# Patient Record
Sex: Female | Born: 1960
Health system: Southern US, Community
[De-identification: ages and names within clinical notes are randomized; demographics above are authoritative.]

## PROBLEM LIST (undated history)

## (undated) DIAGNOSIS — M797 Fibromyalgia: Secondary | ICD-10-CM

## (undated) DIAGNOSIS — E785 Hyperlipidemia, unspecified: Secondary | ICD-10-CM

## (undated) DIAGNOSIS — Z87442 Personal history of urinary calculi: Secondary | ICD-10-CM

## (undated) DIAGNOSIS — R7303 Prediabetes: Secondary | ICD-10-CM

## (undated) DIAGNOSIS — R519 Headache, unspecified: Secondary | ICD-10-CM

## (undated) DIAGNOSIS — G709 Myoneural disorder, unspecified: Secondary | ICD-10-CM

## (undated) DIAGNOSIS — K219 Gastro-esophageal reflux disease without esophagitis: Secondary | ICD-10-CM

## (undated) DIAGNOSIS — F419 Anxiety disorder, unspecified: Secondary | ICD-10-CM

## (undated) DIAGNOSIS — F32A Depression, unspecified: Secondary | ICD-10-CM

## (undated) DIAGNOSIS — IMO0002 Reserved for concepts with insufficient information to code with codable children: Secondary | ICD-10-CM

## (undated) DIAGNOSIS — R011 Cardiac murmur, unspecified: Secondary | ICD-10-CM

## (undated) DIAGNOSIS — Z9889 Other specified postprocedural states: Secondary | ICD-10-CM

## (undated) DIAGNOSIS — F329 Major depressive disorder, single episode, unspecified: Secondary | ICD-10-CM

## (undated) DIAGNOSIS — R112 Nausea with vomiting, unspecified: Secondary | ICD-10-CM

## (undated) DIAGNOSIS — D649 Anemia, unspecified: Secondary | ICD-10-CM

## (undated) DIAGNOSIS — T4145XA Adverse effect of unspecified anesthetic, initial encounter: Secondary | ICD-10-CM

## (undated) DIAGNOSIS — R51 Headache: Secondary | ICD-10-CM

## (undated) DIAGNOSIS — I10 Essential (primary) hypertension: Secondary | ICD-10-CM

## (undated) DIAGNOSIS — T8859XA Other complications of anesthesia, initial encounter: Secondary | ICD-10-CM

## (undated) HISTORY — DX: Hyperlipidemia, unspecified: E78.5

## (undated) HISTORY — DX: Essential (primary) hypertension: I10

## (undated) HISTORY — PX: TUBAL LIGATION: SHX77

## (undated) HISTORY — DX: Anxiety disorder, unspecified: F41.9

## (undated) HISTORY — PX: SHOULDER SURGERY: SHX246

## (undated) HISTORY — DX: Gastro-esophageal reflux disease without esophagitis: K21.9

## (undated) HISTORY — PX: BACK SURGERY: SHX140

## (undated) HISTORY — PX: CARPAL TUNNEL RELEASE: SHX101

## (undated) HISTORY — DX: Anemia, unspecified: D64.9

---

## 1998-12-15 ENCOUNTER — Ambulatory Visit (HOSPITAL_COMMUNITY): Admission: RE | Admit: 1998-12-15 | Discharge: 1998-12-15 | Payer: Self-pay | Admitting: Obstetrics and Gynecology

## 2001-05-04 ENCOUNTER — Encounter: Payer: Self-pay | Admitting: Family Medicine

## 2001-05-04 ENCOUNTER — Ambulatory Visit (HOSPITAL_COMMUNITY): Admission: RE | Admit: 2001-05-04 | Discharge: 2001-05-04 | Payer: Self-pay | Admitting: Family Medicine

## 2001-06-20 ENCOUNTER — Encounter (HOSPITAL_COMMUNITY): Admission: RE | Admit: 2001-06-20 | Discharge: 2001-07-04 | Payer: Self-pay | Admitting: Neurosurgery

## 2001-07-05 ENCOUNTER — Encounter (HOSPITAL_COMMUNITY): Admission: RE | Admit: 2001-07-05 | Discharge: 2001-08-04 | Payer: Self-pay | Admitting: Neurosurgery

## 2001-07-11 ENCOUNTER — Encounter: Admission: RE | Admit: 2001-07-11 | Discharge: 2001-07-11 | Payer: Self-pay | Admitting: Neurosurgery

## 2001-07-11 ENCOUNTER — Encounter: Payer: Self-pay | Admitting: Neurosurgery

## 2001-07-25 ENCOUNTER — Encounter: Payer: Self-pay | Admitting: Neurosurgery

## 2001-07-25 ENCOUNTER — Encounter: Admission: RE | Admit: 2001-07-25 | Discharge: 2001-07-25 | Payer: Self-pay | Admitting: Neurosurgery

## 2001-07-31 ENCOUNTER — Encounter: Payer: Self-pay | Admitting: Neurosurgery

## 2001-07-31 ENCOUNTER — Encounter: Admission: RE | Admit: 2001-07-31 | Discharge: 2001-07-31 | Payer: Self-pay | Admitting: Neurosurgery

## 2001-08-21 ENCOUNTER — Encounter: Payer: Self-pay | Admitting: Neurosurgery

## 2001-08-23 ENCOUNTER — Encounter: Payer: Self-pay | Admitting: Neurosurgery

## 2001-08-23 ENCOUNTER — Inpatient Hospital Stay (HOSPITAL_COMMUNITY): Admission: RE | Admit: 2001-08-23 | Discharge: 2001-08-24 | Payer: Self-pay | Admitting: Neurosurgery

## 2003-03-20 ENCOUNTER — Ambulatory Visit (HOSPITAL_COMMUNITY): Admission: RE | Admit: 2003-03-20 | Discharge: 2003-03-20 | Payer: Self-pay | Admitting: Internal Medicine

## 2003-03-20 ENCOUNTER — Encounter: Payer: Self-pay | Admitting: Internal Medicine

## 2004-09-06 ENCOUNTER — Ambulatory Visit (HOSPITAL_COMMUNITY): Admission: RE | Admit: 2004-09-06 | Discharge: 2004-09-06 | Payer: Self-pay | Admitting: Family Medicine

## 2005-03-04 ENCOUNTER — Ambulatory Visit (HOSPITAL_COMMUNITY): Admission: RE | Admit: 2005-03-04 | Discharge: 2005-03-04 | Payer: Self-pay | Admitting: Family Medicine

## 2005-07-12 ENCOUNTER — Other Ambulatory Visit: Admission: RE | Admit: 2005-07-12 | Discharge: 2005-07-12 | Payer: Self-pay | Admitting: Obstetrics and Gynecology

## 2005-11-16 ENCOUNTER — Encounter
Admission: RE | Admit: 2005-11-16 | Discharge: 2006-02-14 | Payer: Self-pay | Admitting: Physical Medicine & Rehabilitation

## 2005-11-16 ENCOUNTER — Ambulatory Visit: Payer: Self-pay | Admitting: Physical Medicine & Rehabilitation

## 2005-12-29 ENCOUNTER — Ambulatory Visit: Payer: Self-pay | Admitting: Physical Medicine & Rehabilitation

## 2006-02-23 ENCOUNTER — Ambulatory Visit: Payer: Self-pay | Admitting: Physical Medicine & Rehabilitation

## 2006-02-23 ENCOUNTER — Encounter
Admission: RE | Admit: 2006-02-23 | Discharge: 2006-05-24 | Payer: Self-pay | Admitting: Physical Medicine & Rehabilitation

## 2006-04-18 ENCOUNTER — Ambulatory Visit: Payer: Self-pay | Admitting: Physical Medicine & Rehabilitation

## 2006-07-19 ENCOUNTER — Encounter
Admission: RE | Admit: 2006-07-19 | Discharge: 2006-10-17 | Payer: Self-pay | Admitting: Physical Medicine & Rehabilitation

## 2006-07-19 ENCOUNTER — Ambulatory Visit: Payer: Self-pay | Admitting: Physical Medicine & Rehabilitation

## 2006-08-15 ENCOUNTER — Ambulatory Visit (HOSPITAL_COMMUNITY): Admission: RE | Admit: 2006-08-15 | Discharge: 2006-08-15 | Payer: Self-pay | Admitting: Family Medicine

## 2006-10-18 ENCOUNTER — Ambulatory Visit: Payer: Self-pay | Admitting: Physical Medicine & Rehabilitation

## 2006-10-18 ENCOUNTER — Encounter
Admission: RE | Admit: 2006-10-18 | Discharge: 2007-01-16 | Payer: Self-pay | Admitting: Physical Medicine & Rehabilitation

## 2007-01-10 ENCOUNTER — Encounter
Admission: RE | Admit: 2007-01-10 | Discharge: 2007-04-10 | Payer: Self-pay | Admitting: Physical Medicine & Rehabilitation

## 2007-01-10 ENCOUNTER — Ambulatory Visit: Payer: Self-pay | Admitting: Physical Medicine & Rehabilitation

## 2007-04-03 ENCOUNTER — Ambulatory Visit: Payer: Self-pay | Admitting: Physical Medicine & Rehabilitation

## 2007-06-27 ENCOUNTER — Ambulatory Visit: Payer: Self-pay | Admitting: Physical Medicine & Rehabilitation

## 2007-06-27 ENCOUNTER — Encounter
Admission: RE | Admit: 2007-06-27 | Discharge: 2007-09-04 | Payer: Self-pay | Admitting: Physical Medicine & Rehabilitation

## 2007-09-18 ENCOUNTER — Encounter
Admission: RE | Admit: 2007-09-18 | Discharge: 2007-11-13 | Payer: Self-pay | Admitting: Physical Medicine & Rehabilitation

## 2007-09-18 ENCOUNTER — Ambulatory Visit: Payer: Self-pay | Admitting: Physical Medicine & Rehabilitation

## 2007-12-12 ENCOUNTER — Encounter
Admission: RE | Admit: 2007-12-12 | Discharge: 2008-02-18 | Payer: Self-pay | Admitting: Physical Medicine & Rehabilitation

## 2007-12-12 ENCOUNTER — Ambulatory Visit: Payer: Self-pay | Admitting: Physical Medicine & Rehabilitation

## 2007-12-28 ENCOUNTER — Ambulatory Visit (HOSPITAL_COMMUNITY): Admission: RE | Admit: 2007-12-28 | Discharge: 2007-12-28 | Payer: Self-pay | Admitting: Obstetrics and Gynecology

## 2008-01-16 ENCOUNTER — Other Ambulatory Visit: Admission: RE | Admit: 2008-01-16 | Discharge: 2008-01-16 | Payer: Self-pay | Admitting: Obstetrics and Gynecology

## 2008-03-04 ENCOUNTER — Encounter
Admission: RE | Admit: 2008-03-04 | Discharge: 2008-03-06 | Payer: Self-pay | Admitting: Physical Medicine & Rehabilitation

## 2008-03-06 ENCOUNTER — Ambulatory Visit: Payer: Self-pay | Admitting: Physical Medicine & Rehabilitation

## 2008-07-08 ENCOUNTER — Encounter
Admission: RE | Admit: 2008-07-08 | Discharge: 2008-07-09 | Payer: Self-pay | Admitting: Physical Medicine & Rehabilitation

## 2008-07-09 ENCOUNTER — Ambulatory Visit: Payer: Self-pay | Admitting: Physical Medicine & Rehabilitation

## 2008-10-28 ENCOUNTER — Encounter
Admission: RE | Admit: 2008-10-28 | Discharge: 2008-10-29 | Payer: Self-pay | Admitting: Physical Medicine & Rehabilitation

## 2008-10-29 ENCOUNTER — Ambulatory Visit: Payer: Self-pay | Admitting: Physical Medicine & Rehabilitation

## 2008-11-11 ENCOUNTER — Ambulatory Visit (HOSPITAL_COMMUNITY)
Admission: RE | Admit: 2008-11-11 | Discharge: 2008-11-11 | Payer: Self-pay | Admitting: Physical Medicine & Rehabilitation

## 2009-01-06 ENCOUNTER — Ambulatory Visit (HOSPITAL_BASED_OUTPATIENT_CLINIC_OR_DEPARTMENT_OTHER): Admission: RE | Admit: 2009-01-06 | Discharge: 2009-01-07 | Payer: Self-pay | Admitting: Orthopedic Surgery

## 2009-01-20 ENCOUNTER — Encounter
Admission: RE | Admit: 2009-01-20 | Discharge: 2009-04-20 | Payer: Self-pay | Admitting: Physical Medicine & Rehabilitation

## 2009-01-21 ENCOUNTER — Ambulatory Visit: Payer: Self-pay | Admitting: Physical Medicine & Rehabilitation

## 2009-02-18 ENCOUNTER — Ambulatory Visit: Payer: Self-pay | Admitting: Physical Medicine & Rehabilitation

## 2009-03-25 ENCOUNTER — Ambulatory Visit: Payer: Self-pay | Admitting: Physical Medicine & Rehabilitation

## 2009-04-27 ENCOUNTER — Encounter
Admission: RE | Admit: 2009-04-27 | Discharge: 2009-07-26 | Payer: Self-pay | Admitting: Physical Medicine & Rehabilitation

## 2009-04-29 ENCOUNTER — Ambulatory Visit: Payer: Self-pay | Admitting: Physical Medicine & Rehabilitation

## 2009-06-24 ENCOUNTER — Ambulatory Visit: Payer: Self-pay | Admitting: Physical Medicine & Rehabilitation

## 2009-08-17 ENCOUNTER — Encounter
Admission: RE | Admit: 2009-08-17 | Discharge: 2009-08-19 | Payer: Self-pay | Admitting: Physical Medicine & Rehabilitation

## 2009-08-19 ENCOUNTER — Ambulatory Visit: Payer: Self-pay | Admitting: Physical Medicine & Rehabilitation

## 2009-11-10 ENCOUNTER — Encounter
Admission: RE | Admit: 2009-11-10 | Discharge: 2009-12-01 | Payer: Self-pay | Admitting: Physical Medicine & Rehabilitation

## 2009-11-11 ENCOUNTER — Ambulatory Visit: Payer: Self-pay | Admitting: Physical Medicine & Rehabilitation

## 2010-02-01 ENCOUNTER — Encounter
Admission: RE | Admit: 2010-02-01 | Discharge: 2010-02-03 | Payer: Self-pay | Admitting: Physical Medicine & Rehabilitation

## 2010-02-03 ENCOUNTER — Ambulatory Visit: Payer: Self-pay | Admitting: Physical Medicine & Rehabilitation

## 2010-04-27 ENCOUNTER — Encounter
Admission: RE | Admit: 2010-04-27 | Discharge: 2010-04-28 | Payer: Self-pay | Admitting: Physical Medicine & Rehabilitation

## 2010-04-28 ENCOUNTER — Ambulatory Visit: Payer: Self-pay | Admitting: Physical Medicine & Rehabilitation

## 2010-07-02 ENCOUNTER — Ambulatory Visit (HOSPITAL_COMMUNITY): Admission: RE | Admit: 2010-07-02 | Discharge: 2010-07-02 | Payer: Self-pay | Admitting: Obstetrics and Gynecology

## 2010-07-16 ENCOUNTER — Encounter
Admission: RE | Admit: 2010-07-16 | Discharge: 2010-10-12 | Payer: Self-pay | Admitting: Physical Medicine & Rehabilitation

## 2010-07-26 ENCOUNTER — Ambulatory Visit: Payer: Self-pay | Admitting: Physical Medicine & Rehabilitation

## 2010-10-12 ENCOUNTER — Encounter
Admission: RE | Admit: 2010-10-12 | Discharge: 2010-11-19 | Payer: Self-pay | Source: Home / Self Care | Attending: Physical Medicine & Rehabilitation | Admitting: Physical Medicine & Rehabilitation

## 2010-10-18 ENCOUNTER — Ambulatory Visit: Payer: Self-pay | Admitting: Physical Medicine & Rehabilitation

## 2010-11-19 ENCOUNTER — Ambulatory Visit: Payer: Self-pay | Admitting: Physical Medicine & Rehabilitation

## 2011-02-14 ENCOUNTER — Encounter: Payer: BC Managed Care – PPO | Attending: Physical Medicine & Rehabilitation

## 2011-02-14 ENCOUNTER — Ambulatory Visit (HOSPITAL_BASED_OUTPATIENT_CLINIC_OR_DEPARTMENT_OTHER): Payer: BC Managed Care – PPO | Admitting: Physical Medicine & Rehabilitation

## 2011-02-14 DIAGNOSIS — M719 Bursopathy, unspecified: Secondary | ICD-10-CM | POA: Insufficient documentation

## 2011-02-14 DIAGNOSIS — G8929 Other chronic pain: Secondary | ICD-10-CM | POA: Insufficient documentation

## 2011-02-14 DIAGNOSIS — M5382 Other specified dorsopathies, cervical region: Secondary | ICD-10-CM

## 2011-02-14 DIAGNOSIS — IMO0002 Reserved for concepts with insufficient information to code with codable children: Secondary | ICD-10-CM | POA: Insufficient documentation

## 2011-02-14 DIAGNOSIS — M545 Low back pain, unspecified: Secondary | ICD-10-CM

## 2011-02-14 DIAGNOSIS — M2170 Unequal limb length (acquired), unspecified site: Secondary | ICD-10-CM | POA: Insufficient documentation

## 2011-02-14 DIAGNOSIS — M7530 Calcific tendinitis of unspecified shoulder: Secondary | ICD-10-CM

## 2011-02-14 DIAGNOSIS — IMO0001 Reserved for inherently not codable concepts without codable children: Secondary | ICD-10-CM | POA: Insufficient documentation

## 2011-02-14 DIAGNOSIS — M67919 Unspecified disorder of synovium and tendon, unspecified shoulder: Secondary | ICD-10-CM | POA: Insufficient documentation

## 2011-03-02 ENCOUNTER — Ambulatory Visit (HOSPITAL_COMMUNITY)
Admission: RE | Admit: 2011-03-02 | Discharge: 2011-03-02 | Disposition: A | Discharge status: Home / Self Care | Payer: BC Managed Care – PPO | Source: Ambulatory Visit | Attending: Family Medicine | Admitting: Family Medicine

## 2011-03-02 ENCOUNTER — Other Ambulatory Visit (HOSPITAL_COMMUNITY): Payer: Self-pay | Admitting: Family Medicine

## 2011-03-02 DIAGNOSIS — S99929A Unspecified injury of unspecified foot, initial encounter: Secondary | ICD-10-CM | POA: Insufficient documentation

## 2011-03-02 DIAGNOSIS — S99919A Unspecified injury of unspecified ankle, initial encounter: Secondary | ICD-10-CM | POA: Insufficient documentation

## 2011-03-02 DIAGNOSIS — W19XXXA Unspecified fall, initial encounter: Secondary | ICD-10-CM | POA: Insufficient documentation

## 2011-03-02 DIAGNOSIS — M79609 Pain in unspecified limb: Secondary | ICD-10-CM | POA: Insufficient documentation

## 2011-03-02 DIAGNOSIS — M79669 Pain in unspecified lower leg: Secondary | ICD-10-CM

## 2011-03-02 DIAGNOSIS — M25572 Pain in left ankle and joints of left foot: Secondary | ICD-10-CM

## 2011-03-02 DIAGNOSIS — M25579 Pain in unspecified ankle and joints of unspecified foot: Secondary | ICD-10-CM | POA: Insufficient documentation

## 2011-03-02 DIAGNOSIS — S8990XA Unspecified injury of unspecified lower leg, initial encounter: Secondary | ICD-10-CM | POA: Insufficient documentation

## 2011-03-22 LAB — BASIC METABOLIC PANEL
BUN: 7 mg/dL (ref 6–23)
CO2: 27 mEq/L (ref 19–32)
Calcium: 9.1 mg/dL (ref 8.4–10.5)
Chloride: 105 mEq/L (ref 96–112)
Creatinine, Ser: 0.58 mg/dL (ref 0.4–1.2)
GFR calc Af Amer: >60 mL/min (ref >60)

## 2011-03-22 LAB — POCT HEMOGLOBIN-HEMACUE: Hemoglobin: 14.9 g/dL (ref 12.0–15.0)

## 2011-04-04 NOTE — Assessment & Plan Note (Signed)
Melanie Avila is back regarding Melanie Avila fibromyalgia, back and leg pain.  The Lyrica may have helped a bit with pain.  She had some dizziness with it initially, but feels that she has become accustomed to that now and tolerating it better.  She still has a lot of work stressed upon Melanie Avila at home, taking care of Melanie Avila mother and working essentially full-time.  Melanie Avila pain is 6-8/10 currently.  Pain is tingling, aching, stabbing, mostly in the back and left posterior leg.  Pain interferes with general activities, relations with others, enjoyment of life on a moderate level.  Sleep is fair.  REVIEW OF SYSTEMS:  Notable for the above as well as spasms, depression, anxiety, tingling, weakness.  Full 12-point review is in the written health and history section of the chart.  PHYSICAL EXAMINATION:  VITAL SIGNS:  Blood pressure is stable at 140/90, pulse 72, respiratory rate 18.  She is satting 97% on room air. GENERAL:  The patient is pleasant, alert and oriented x3.  Affect is generally bright and appropriate. MUSCULOSKELETAL:  Pain is still with flexion, more than extension. Decreased sensation on the posterior leg on the left.  Strength generally at the ankle is 3/5 with plantar flexion.  Reflexes are absent at the left Achilles tendon.  She has pain with palpation over the lower lumbar spine segments, particularly at L5-S1.  She had difficulty moving from a sitting to standing position and tends to walk with a bit of a flexed position.  She has pain elsewhere in the arms, legs, shoulders, upper back, etc.  ASSESSMENT: 1. Fibromyalgia syndrome. 2. Chronic low back pain with chronic left S1 radiculopathy. 3. Left leg length discrepancy. 4. Right rotator cuff syndrome. 5. Questionable temporomandibular joint pain.  PLAN: 1. Continue with Lyrica, increase ultimately to 100 t.i.d. 2. Discussed using a long-acting opiates as I wonder hydrocodone use     to diminish.  If she has no significant results with  Lyrica     titration, we will begin fentanyl patch next month 25 mcg q.72     hours. 3. We discussed again the fact that until Melanie Avila family situation     changes.  Melanie Avila problems are not apt to improve as frankly she is     exhausted all the time. 4. I will see Melanie Avila back in about 2-3 months.  She will call me with     questions about the fentanyl and certainly will see me back sooner     if she chooses to start this medication.     Ranelle Oyster, M.D. Electronically Signed    ZTS/MedQ D:  02/14/2011 13:45:33  T:  02/14/2011 22:23:03  Job #:  [id number]

## 2011-04-15 ENCOUNTER — Other Ambulatory Visit (HOSPITAL_COMMUNITY): Payer: Self-pay | Admitting: Family Medicine

## 2011-04-15 DIAGNOSIS — R109 Unspecified abdominal pain: Secondary | ICD-10-CM

## 2011-04-18 ENCOUNTER — Encounter (HOSPITAL_COMMUNITY)
Admission: RE | Admit: 2011-04-18 | Discharge: 2011-04-18 | Disposition: A | Discharge status: Home / Self Care | Payer: BC Managed Care – PPO | Source: Ambulatory Visit | Attending: Family Medicine | Admitting: Family Medicine

## 2011-04-18 DIAGNOSIS — R112 Nausea with vomiting, unspecified: Secondary | ICD-10-CM | POA: Insufficient documentation

## 2011-04-18 DIAGNOSIS — R109 Unspecified abdominal pain: Secondary | ICD-10-CM | POA: Insufficient documentation

## 2011-04-18 MED ORDER — TECHNETIUM TC 99M MEBROFENIN IV KIT
5.0000 | PACK | Freq: Once | INTRAVENOUS | Status: AC | PRN
  Administered 2011-04-18: 5.28 via INTRAVENOUS

## 2011-04-19 NOTE — Assessment & Plan Note (Signed)
Melanie Avila returns to clinic today for followup evaluation.  She reports  that she is doing well overall.  She continues to take minimal amounts  of her pain medicines.  She does need a refill on the hydrocodone along  with the Xanax in the office today.  She has a sufficient supply of Soma  at this point.  Her job continues to go well.  She has made no other  changes in her medicines.   REVIEW OF SYSTEMS:  Positive for sleep apnea.   MEDICATIONS:  1. Hydrocodone 7.5/650 one tablet t.i.d. p.r.n. (three per day).  2. Iron sulfate 225 mg daily.  3. Benicar/hydrochlorothiazide 40/12.5, one-half tablet daily.  4. Diclofenac 75 mg b.i.d.  5. Zetia 10 mg daily.  6. Fish Oil 1000 mg daily.  7. Alprazolam 1 mg, one-half to one tablet p.o. q.h.s.  8. Soma 350 mg t.i.d. p.r.n. (1-3 per day).   PHYSICAL EXAM:  A well-appearing thin adult female in mild to no acute  discomfort, blood pressure 159/85 with a pulse of 81, respiratory rate  18 and O2 saturation 100% on room air.  She is 5-/5 strength throughout  . She ambulates without any assistive device.   IMPRESSION:  1. Diffuse musculoskeletal pain, questionable fibromyalgia.  2. Acute shoulder pain after work, resolved.  3. Acute low back pain exacerbation, improved.   In the office today we did refill the patient's Xanax and hydrocodone.  Will plan on seeing her in followup in approximately 3 months time with  refills prior to that appointment as necessary.           ______________________________  Ellwood Dense, M.D.     DC/MedQ  D:  06/28/2007 10:16:58  T:  06/28/2007 16:26:14  Job #:  [id number]

## 2011-04-19 NOTE — Assessment & Plan Note (Signed)
Ms. Morash returns to clinic today for followup evaluation.  She reports  that overall she is doing well.  She continues to work at a regular job  which includes 12-hour shifts.  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  Her present job certainly requires more lifting than she has  been doing in the past.  She also continues to care for her elderly  mother who may or may not have lung cancer.  She is doing a lot of  transporting in terms of getting her mother to doctor's appointments.   REVIEW OF SYSTEMS:  Noncontributory.   MEDICATIONS:  1. Hydrocodone 7.5/650 one tablet 3 to 5 times per day p.r.n.  2. Iron sulfate 225 mg daily.  3. Benicar/hydrochlorothiazide 40/12.5 one-half tablet daily.  4. Diclofenac 75 mg b.i.d.  5. Zetia 10 mg daily.  6. Fish oil 1000 mg daily.  7. Alprazolam 1 mg p.o. nightly.  8. Soma 350 mg t.i.d. p.r.n. (0 to 3 per day).   PHYSICAL EXAMINATION:  GENERAL:  Well-appearing fit adult female in mild  acute discomfort.  VITAL SIGNS:  Blood pressure 148/98 with pulse of 84, respiratory rate  18, and O2 saturation 99% on room air.  She has 4+/5 strength throughout  the bilateral upper and lower extremities.  She ambulates without any  assistive device.   IMPRESSION:  1. Diffuse musculoskeletal pain, questionable fibromyalgia.  2. Acute right shoulder pain, improved.  3. Acute low back pain exacerbation in mid December 2008.   In the office today, no refill on medication is necessary.  She  continues to use the hydrocodone, gets good analgesic effect without  signs of diversion or significant side effects.  We will plan on seeing  the patient in followup in this office in approximately 4 months' time  with refills prior to that appointment if necessary.           ______________________________  Ellwood Dense, M.D.     DC/MedQ  D:  07/09/2008 09:18:07  T:  07/10/2008 00:04:05   Job #:  [id number]

## 2011-04-19 NOTE — Assessment & Plan Note (Signed)
Melanie Avila returns to clinic today for followup evaluation.  She reports  that she had acute onset of increased right neck and arm pain dating  from November 22, 2007.  She reports no precipitating event at that time  and reports that she was on leave for the holidays from work started  November 26, 2007 through December 08, 2007.  She has returned to work but  still having persistent pain.  She does report that she gets a little  bit of relief with use of the Soma and hydrocodone at slightly increased  dosages.  She reports that her pain is present in her right neck along  with the right shoulder and entire right upper extremity above the elbow  level along with low back.  She, again, notes no precipitating event,  back in mid-December, that would have brought on this pain.   REVIEW OF SYSTEMS:  Noncontributory.   MEDICATIONS:  1. Hydrocodone 7.5/650 1 tablet 3-4 times per day.  2. Iron sulfate 225 mg  3. Benicar/hydrochlorothiazide 40/12.5 1/2 tablet daily.  4. Diclofenac 75 mg b.i.d.  5. Zetia 10 mg daily.  6. Fish oil 1000 mg daily.  7. Alprazolam 1 mg 1 tablet p.o. nightly.  8. Soma 350 mg t.i.d. p.r.n. (approximately 3 per day lately).   PHYSICAL EXAMINATION:  GENERAL:  A middle-aged adult female in moderate  to acute discomfort involving her right shoulder and right neck and low  back.  She ambulates without any assistive device.  She does not  tolerate range of motion or manual muscle testing of the right upper  extremity.   IMPRESSION:  1. Diffuse musculoskeletal pain, questionable fibromyalgia.  2. Acute worsening right shoulder and arm pain without precipitating      event, mid-December 2008.  3. Acute low back pain exacerbation mid-December 2008.   In the office today, we did increase the patient's hydrocodone to 10/325  and allow her to use up to 5 tablets per day.  We refilled her  Diclofenac and her Soma.  She is allowed to use the Soma up to 3 times  per day.  The  Alprazolam is increased to a whole mg tablet at bedtime.  Will plan on seeing her in followup in approximately 3 months' time.  Anticipate that her pain will gradually resolve over the next few weeks,  as it came on acutely with out any etiology.  It appears a fibromyalgia-  type pain and should resolve with continued pain medicines and time.  We  will plan on seeing the patient in followup as noted above.           ______________________________  Ellwood Dense, M.D.     DC/MedQ  D:  12/13/2007 11:00:37  T:  12/13/2007 11:28:52  Job #:  [id number]

## 2011-04-19 NOTE — Assessment & Plan Note (Signed)
Ms. Niehoff returns to clinic today for follow up evaluation.  Overall she  reports that she is doing fairly well.  She continues to take her  hydrocodone, approximately 3 tablets per day, with reasonable relief.  She does use minimal amounts of the Soma and alprazolam, and needs  refills on each of those in the office today.  She continues to take her  diclofenac twice a day, but has a sufficient supply at the present time.   REVIEW OF SYSTEMS:  Positive for low blood sugar.   MEDICATIONS:  1. Hydrocodone 7.5/650 one tablet t.i.d. p.r.n. (3 per day).  2. Iron sulfate 225 mg daily.  3. Benicar/hydrochlorothiazide 40/12.5 one-half tablet daily.  4. Diclofenac 75 mg b.i.d.  5. Zetia 10 mg.  6. Fish oil 1000 mg daily.  7. Alprazolam 1 mg one-half tablet to one tablet p.o. nightly.  8. Soma 350 mg t.i.d. p.r.n. (1/2 to 1 daily).   PHYSICAL EXAMINATION:  GENERAL:  A well-appearing, middle-aged, adult  female in mild to no acute discomfort.  VITAL SIGNS:  Blood pressure 152/70 with a pulse of 90, respiratory rate  18 an O2 saturation 98% on room air.  EXTREMITIES:  She ambulates without any assistive device.  She has 5-/5  strength throughout.   IMPRESSION:  1. Diffuse musculoskeletal pain, questionable fibromyalgia.  2. Acute shoulder pain after work, resolving.  3. Acute low back pain exacerbation, improved.   In the office today, we did refill the patient's alprazolam and Soma as  of today.  We refilled her hydrocodone as of October 06, 2007.  We will  plan in seeing her and follow up in approximately 3 months time with  refills prior to that appointment as necessary.           ______________________________  Ellwood Dense, M.D.     DC/MedQ  D:  09/19/2007 11:25:16  T:  09/20/2007 09:26:19  Job #:  [id number]

## 2011-04-19 NOTE — Op Note (Signed)
Melanie Avila, Melanie Avila                 ACCOUNT NO.:  [id number]   MEDICAL RECORD NO.:  [id number]          PATIENT TYPE:  AMB   LOCATION:  [facility]                          FACILITY:  [facility]   PHYSICIAN:  Katy Fitch. Sypher, M.D. DATE OF BIRTH:  [birth year]   DATE OF PROCEDURE:  01/06/2009  DATE OF DISCHARGE:                               OPERATIVE REPORT   PREOPERATIVE DIAGNOSES:  1. Severe partial articular surface tear of supraspinatus and      infraspinatus tendons, right shoulder.  2. Severe acromioclavicular arthropathy, right shoulder.  3. MRI evident tendinopathy of subscapularis and labral degenerative      changes and an intramuscular ganglion cyst in the infraspinatus      tendon.   POSTOPERATIVE DIAGNOSES:  A 95-99% two-tendon partial articular surface  rotator cuff tear with avulsion and degenerative failure of rotator cuff  insertion at greater tuberosity and a grade 1 subscapularis degenerative  tear with severe acromioclavicular arthropathy and unfavorable acromial  anatomy as well as moderate degree of labral pathology due to  degenerative change.   OPERATION:  1. Examination of right shoulder under anesthesia.  2. Arthroscopic debridement of labrum, rotator cuff including      supraspinatus, infraspinatus, and subscapularis, and synovectomy.  3. Arthroscopic subacromial decompression with bursectomy,      coracoacromial ligament relaxation, and acromioplasty.  4. Arthroscopic resection of distal 15-mm clavicle.  5. Arthroscopic reconstruction of two-tendon rotator cuff tear with      debridement of necrotic tendon, decortication and tuberosity plasty      of greater tuberosity, and a speed bridge repair utilizing two 5-mm      FiberWire tapes, 4 swivel locks, and a Burkhart bridge suture to      reconstruct the tendon to the articular margin.   OPERATING SURGEON:  Katy Fitch. Sypher, MD   ASSISTANT:  Marveen Reeks Dasnoit, PA-C   ANESTHESIA:  General endotracheal  supplemented by a right interscalene  block.   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   She is referred through the courtesy of Dr. Ellwood Dense, physiatrist  at the [facility] for evaluation of a chronically painful right  shoulder.   Dr. Thomasena Edis evaluated shoulder and identified a severe tendinopathy of  the supraspinatus and infraspinatus tendon with a articular surface  tearing and a large intramuscular/musculotendinous junction ganglion  cyst in the infraspinatus.   Dr. Thomasena Edis referred Ms. Labella for an upper extremity orthopedic consult  on November 26, 2008.  At that time, she was noted have marked weakness  of abduction, external rotation, signs of rotator cuff tear, and severe  AC arthropathy.  The MRI was reviewed on desk and notable for  significant compromise of the subacromial outlet due to medial  osteophyte formation at the acromion and the distal clavicle being  prominent inferiorly.  There was marked swelling in the rotator cuff due  to chronic impingement and a partial articular surface rotator cuff  tear.   We advised her to proceed with arthroscopic  evaluation, probable cuff  repair, and debridement as necessary.  After informed consent, she is  brought to the operating room at this time.  Preoperatively, she was  reminded that it would take a minimum of 12-16 weeks to recover  reasonable function to return to work.  She understands that the surgery  may require either arthroscopic or open technique, depending on our  ability to precisely repair her tendon.  She was reminded of the risks  of infection, anesthesia, and potential failure of the repair.  After  informed consent, she is brought to the operating room at this time.   PROCEDURE:  Josphine Laffey is brought to the operating room and placed in  supine position upon the operating table.   Following an  anesthesia consult with Dr. Jean Rosenthal, general anesthesia by  endotracheal technique supplemented by interscalene block was  recommended and accepted by Ms. Katherina Right   Dr. Jean Rosenthal placed interscalene block without complication.  Ms. Mattes  was brought to room 6, placed in supine position upon the operating  table and under Dr. Edison Pace direct supervision, general endotracheal  anesthesia was accomplished with the aid of a glide scope.  Ms. Dicostanzo  airway was noted be quite challenging to instrument.   She was subsequently positioned in the beach-chair position with the aid  of a torso and head holder designed for shoulder arthroscopy followed by  routine prep of the upper extremity and forequarter with DuraPrep.  Impervious arthroscopy drapes were applied.   Examination of the shoulder under anesthesia revealed signs of  impingement.  No sign of a capsule ligamentous instability.   The scope was introduced through a standard posterior viewing portal  followed by diagnostic arthroscopy.  An anterior portal was created  under direct vision followed by debridement of the labrum, synovitis,  and a grade 1 tear of the subscapularis.  The biceps was stable at its  origin at the superior labrum and was normal through the rotator  interval.  The deep surface of the infraspinatus and supraspinatus was  severely degenerative and had a 90-95% articular surface tear  essentially fully separated from the tuberosity.   A spinal needle was used to sound the pathway for 2 swivel lock anchors  and a speed bridge repair was contemplated.   The greater tuberosity was cleaned of necrotic tendon, burnished with  the bipolar cautery, and a bur was used to decorticate lightly to a  bleeding surface.   Swivel locks were placed medially at the articular margin and placed  under direct vision with fiber tape to create a speed bridge.  The  FiberWire sutures were left in the swivel locks to perform a  Burkhart  medial bridge.   A Scorpion was used to obtain purchase at different positions through  the rotator cuff more medial to the initial entry point, followed by  crisscross of the fiber tapes and inset of 2 lateral swivel locks.  The  Burkhart suture bridge was then brought out to a Passport lateral  cannula, tied with 6 square knots, replaced in the joint, and used with  a push-lock laterally to inset the central portion of the repair.   A very satisfactory footprint was achieved.   The subacromial space was thoroughly debrided and hemostasis achieved  with bipolar cautery.  The acromion had been leveled to a type 1  morphology and the distal 15-mm clavicle resected with the suction bur.  Hemostasis in the subacromial space was accomplished with bipolar  cautery  followed by removal of the passport and trimming of all sutures  to an appropriate length.  The scope was replaced in the glenohumeral  joint and a very satisfactory final repair achieved with inset of the  tendon to the articular margin.   There were no apparent complications.   Ms. Menzel tolerated the surgery and anesthesia well.  She was brought to  the recovery room in stable vital signs.  [facility] will [facility] for observation of vital signs, analgesics in the  form of p.o. and IV Dilaudid as well as p.o. Percocet.  She will be  provided 3 doses of IV Ancef 1 gram q.8 h.  We anticipate discharge in  23 hours.      Katy Fitch Sypher, M.D.  Electronically Signed     RVS/MEDQ  D:  01/06/2009  T:  01/07/2009  Job:  [id number]   cc:   Ellwood Dense, M.D.

## 2011-04-19 NOTE — Assessment & Plan Note (Signed)
Melanie Avila is back regarding her fibromyalgia and chronic right shoulder  pain.  Shoulder remains in rehab.  She had some pain recently with the  cold damp weather.  We tried ReQuip and she is still using it, but feels  a bit groggy from it in the morning.  She is using her Xanax to help her  to get the sleep still.  She was unable to find the DHEA.  Overall,  states that pain is sharp, stabbing, constant aching.  Pain interferes  with general activity, relations with others, enjoyment of life on a  moderate level.  Sleep is fair.   REVIEW OF SYSTEMS:  Notable for the above.  Full review is in the  written health and history section of the chart.   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  She lives with her husband and son.   PHYSICAL EXAMINATION:  VITAL SIGNS:  Blood pressure 157/95, pulse is 88,  respiratory 16, and she is sating 99% on room air.  NEUROLOGIC:  The patient is pleasant, alert, and oriented x3.  Affect  showing bright and appropriate.  MUSCULOSKELETAL:  Gait is normal.  She continues to have right shoulder  pain with range of motion and limited active and passive movement.  She  has multiple tender points still on examination today on the back, legs,  arms, etc.  Gait was balanced and stable.  Cognitively she is within  normal limits.  HEART:  Regular.  CHEST:  Clear.  ABDOMEN:  Soft and nontender.   ASSESSMENT:  1. Fibromyalgia syndrome.  2. Right rotator cuff disease, status post arthroscopic surgery.  3. Low back pain.   PLAN:  1. We will stop the ReQuip and begin a trial of Rozerem 8 mg nightly.      She may use her Xanax still as needed with this.  2. Begin a trial of Savella 12.5 mg daily and titrate up to 50 mg      daily after 2-3 weeks' time.  3. Recommend DHEA trial once the Savella is on board.  4. Refill hydrocodone 10/325 one 7 times a day p.r.n.  5. Continue activity, exercise as  possible.  6. We will see her back in about a month.      Ranelle Oyster, M.D.  Electronically Signed     ZTS/MedQ  D:  03/25/2009 11:52:06  T:  03/26/2009 00:27:34  Job #:  [id number]

## 2011-04-19 NOTE — Assessment & Plan Note (Signed)
HISTORY OF PRESENT ILLNESS:  Melanie Avila is back regarding her fibromyalgia  and chronic shoulder pain.  She likes Jaclynn Guarneri.  In fact, she had  questions about potentially increasing the dose.  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  That  causes a lot of fatigue in her legs and she is having some pain in the  left foot.  She has gone through 3 different pairs of shoes trying to  find something that is comfortable in general for her.  Her pain is  6/10.  She describes as stabbing and aching.  Pain interferes with  general activity, relations with others, enjoyment of life on a mild-to-  moderate level.  Sleep is fair.   REVIEW OF SYSTEMS:  Notable for weakness.  Other pertinent positives are  above.  Full review is in the written health and history section of the  chart.   SOCIAL HISTORY:  Noted above.   PHYSICAL EXAMINATION:  VITAL SIGNS:  Blood pressure is 151/85, pulse is  88, respiratory rate 18.  She is sating 98% on room air.  GENERAL:  The patient is pleasant, alert, and oriented x3.  EXTREMITIES:  The patient walks with fairly balanced gait, although we  noticed a elevation of right hemipelvis today.  I measured her leg  length, and she is essentially was a centimeter to centimeter in half  shorter on the left than the right.  Back was mildly tender to  palpation.  She had tender points throughout the arms, legs, back, hands  today.  Shoulder was minimally tender with range today.  NEUROLOGIC:  Cognitively, she is appropriate.  HEART:  Regular.  CHEST:  Clear.  ABDOMEN:  Soft, nontender.   ASSESSMENT:  1. Fibromyalgia syndrome.  2. Right rotator cuff syndrome.  3. Chronic low back pain and left leg length discrepancy.   PLAN:  1. We will increase Cymbalta to 100 mg b.i.d.  2. The patient's hydrocodone was refilled last week.  3. Discussed shoe insert for left side.  I would start with a quarter      inch gel or foam insert and  observe for response.  Considering her      new job, her leg length and foot wear are extremely important now.      She mainly looked at some other options which we discussed today at      length for shoes.  4. The patient may continue with Requip at night.  5. I  refilled her Soma 350 mg q. 8h p.r.n. with a 52-month      prescription.  6. I will see her back in about 2 months' time.      Ranelle Oyster, M.D.  Electronically Signed     ZTS/MedQ  D:  06/24/2009 11:17:07  T:  06/25/2009 00:44:14  Job #:  [id number]   cc:   Katy Fitch. Sypher, M.D.  Fax: [phone]

## 2011-04-19 NOTE — Assessment & Plan Note (Signed)
Melanie Avila is back for her fibromyalgia and chronic shoulder pain.  She has  done quite well with the [facility] and overall pain has been better.  She  feels much less sensitivity and her energy is increased.  She did not  get the Rozerem filled because of cost reasons.  Her pain is 7/10.  She  is still using about #7 hydrocodone a day, but this is generally due to  the shoulder.  She has been cleared to work and will return to work next  week.  Pain generally interferes with general activity, relations with  others, enjoyment of life on a mild-to-moderate level.  Sleep is fair.   REVIEW OF SYSTEMS:  Notable for the above.  Full 14-point review is in  the written health and history section of the chart.   SOCIAL HISTORY:  Essentially is unchanged, as noted above.  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  She will be working 40+ hours a week.   PHYSICAL EXAMINATION:  VITAL SIGNS:  Blood pressure 155/89, pulse is 92,  respiratory 18.  She is sating 100% on room air.  GENERAL:  The patient is pleasant, alert, and oriented x3.  MUSCULOSKELETAL:  She has normal gait pattern.  She has some mild  shoulder pain in range of motion.  She still has some limits with her  active movement.  Tender points are decreased as a whole today.  NEUROLOGIC:  She is bright and alert.  Cognitively she is normal.  Cranial nerve exam is intact.  HEART:  Regular.  CHEST:  Clear.  ABDOMEN:  Soft and nontender.   ASSESSMENT:  1. Fibromyalgia syndrome.  2. Right rotator cuff disease, status post arthroscopic surgery.  3. Low back pain.   PLAN:  1. The patient may continue with ReQuip at low dose, but she is doing      okay with this at this point.  We will introduce over-the-counter      melatonin 3 mg 1-3 tablets at bedtime as well for pain and sleep.  2. We will increase Savella to 50 mg  b.i.d. preferentially at      breakfast and dinner time.  3. Refill hydrocodone a week ago.  I asked her to make a goal of      reducing her use to 4 or 5 times a day over the next few months.  4. I will see her back in 2 months' time.  Generally, I am pleased      with her progress.      Ranelle Oyster, M.D.  Electronically Signed     ZTS/MedQ  D:  04/29/2009 11:55:33  T:  04/30/2009 02:52:47  Job #:  [id number]   cc:   Katy Fitch. Sypher, M.D.  Fax: [phone]

## 2011-04-19 NOTE — Assessment & Plan Note (Signed)
[name] returns to clinic today for followup evaluation.  She reports  that over the past week she has had substantially increased pain in her  right shoulder and neck.  She reports that the pain radiates down to her  mid upper arm with no sensory loss.  She reports that the pain is very  severe sometimes and even the 5 hydrocodone that she takes daily does  not help it on the severe days.  She is reluctant to increase her pain  medicines, but feels that she may need to on an as needed basis.  She is  not interested in an MRI scan of her cervical spine or shoulder at this  point.  She has issues regarding cough at this point.  We have decided  to increase her hydrocodone slightly to allow for increased pain relief.  She has been off work for 2 weeks, one for increased pain and then 1  week for vacation.  Despite being off work, she has not had a resolution  of her arm pain.  She is using moist heat and sports wrap on her right  shoulder at the present time with some minimal relief.   REVIEW OF SYSTEMS:  Noncontributory.   MEDICATIONS:  1. Hydrocodone 10/325 one tablet 5 times per day p.r.n.  2. Iron sulfate 225 mg.  3. Benicar/hydrochlorothiazide 40/12.5 one-half tablet daily.  4. Diclofenac 75 mg b.i.d.  5. Zetia 10 mg.  6. Fish oil 1000 mg.  7. Alprazolam 1 mg p.o. at bedtime.  8. Soma 350 mg t.i.d. p.r.n. (0-3 per day).   PHYSICAL EXAMINATION:  GENERAL:  Well-appearing fit female in moderate-  to-acute discomfort involving her right shoulder and neck.  VITAL SIGNS:  Blood pressure is 154/96 with pulse of 102, respiratory  rate 18, and O2 saturation 98% on room air.  EXTREMITIES:  She is 4+/5 strength throughout the bilateral upper and  lower extremities.  Bulk and tone were normal.  She ambulates without  any assistive device.   IMPRESSION:  1. Diffuse musculoskeletal pain, questionable fibromyalgia.  2. Acute right shoulder pain, slightly worse.  3. Acute low back pain  exacerbation, mid December 2008.   In the office today, we did increase the patient's hydrocodone up to 7  times per day on an as needed basis 210 total.  We will plan on seeing  the patient in followup in this office in approximately 2-3 months'  time.  We still may need to proceed with an MRI scan of her right  shoulder or possibly cervical region.  She is reluctant to do that at  the present time secondary to increased cough.  We will plan on seeing  her in followup as noted above.           ______________________________  Ellwood Dense, M.D.     DC/MedQ  D:  10/29/2008 10:29:11  T:  10/29/2008 21:18:09  Job #:  [id number]

## 2011-04-19 NOTE — Assessment & Plan Note (Signed)
Melanie Avila is back regarding chronic cervical right shoulder pain as well as  her fibromyalgia.  I am taking over her care for Dr. Thomasena Edis.  The  patient has been on hydrocodone for some time, 10/325 up to 7 times a  day.  She has never tried Lyrica by her account due to history of side  effects reported in literature and through people she knows.  She tried  Cymbalta and Flexeril which both drove her crazy.  She had shoulder  surgery essentially about 1 month  ago by Dr. Teressa Senter with repair of  rotator cuff tears and also some bone spur removal.  The patient states  that the course has been slower than she had expected.  She looks to be  back to work by June.  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   Additionally for pain, the patient uses diclofenac 75 b.i.d. as well as  fish oil and Soma 350 mg t.i.d. p.r.n.   REVIEW OF SYSTEMS:  Notable for the above.  Full review is in the  written health and history section of the chart.  The patient's Oswestry  score is 30% today.   SOCIAL HISTORY:  The patient is married, lives with her son and husband.  Still smokes half-pack cigarettes per day.   PHYSICAL EXAMINATION:  VITAL SIGNS:  Blood pressure 155/94, pulse is 88,  respiratory rate is 60.  She is sating 98% on room air.  GENERAL:  The patient is pleasant, alert, and oriented x3.  Affect is  generally bright and appropriate.  MUSCULOSKELETAL:  Right shoulder is notably painful to range.  She  limited to 20-30 degrees of flexion and abduction of the shoulder today.  Left shoulder has nearly normal range of motion.  She has multiple  tender points throughout the neck, shoulders, chest, back, hips, knees,  elbows, feet.  Gait is generally stable.  NEUROLOGIC:  Cognition is within normal limits.  HEART:  Regular.  CHEST:  Clear.  ABDOMEN:  Soft and nontender.   ASSESSMENT:  1. Fibromyalgia syndrome.  2. History of right rotator  cuff disease, status post arthroscopic      repair.  3. History of ongoing low back pain.   PLAN:  1. I discussed the fact that I would like to wean her off of her Norco      10/325, which is at 7 per day.  We will not start that now, but      that would be a goal over the next few weeks.  I think we can treat      her fibromyalgia and nonnarcotic sense to get down her generalized      pain levels.  2. Along these lines, we will start ReQuip 1 mg at bedtime 1/2-1      tablet for use for sleep, restless leg syndrome and pain      management.  We discussed effects and side effects of medication      today.  3. I refilled Xanax 1 mg 1 to 1-1/2 at bedtime.  4. I would like the patient to begin trial of DHEA 25 mg daily for      pain and energy.  She may continue fish oil as well.  5. We will see her back in about 1-2 months' time.  We will discuss      other options at that point as well for fibromyalgia  treatment.      Ranelle Oyster, M.D.  Electronically Signed     ZTS/MedQ  D:  02/18/2009 14:27:22  T:  02/19/2009 01:16:04  Job #:  [id number]

## 2011-04-19 NOTE — Assessment & Plan Note (Signed)
Melanie Avila returns to the clinic today for followup evaluation.  Overall,  she is doing well.  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  That has increased her pain as she is doing heavier  lifting.   REVIEW OF SYSTEMS:  Noncontributory.   MEDICATIONS:  1. Hydrocodone 7.5/650, one tablet 3-4 times per day p.r.n.  2. Iron sulfate 225 mg daily.  3. Benicar/hydrochlorothiazide 40/12.5 one half tablet daily.  4. Diclofenac 75 mg b.i.d.  5. Zetia 10mg  daily.  6. Fish oil 1000 mg daily.  7. Alprazolam 1 mg one tablet nightly.  8. Soma 250 mg t.i.d. p.r.n. (approximately 3 per day).   PHYSICAL EXAMINATION:  GENERAL:  Well-appearing adult female in mild  acute discomfort.  VITAL SIGNS:  Blood pressure 143/76, pulse 78, respiratory rate 18,  and02 saturation 96% on room air.  MUSCULOSKELETAL:  She has 4 plus over 5 strength throughout the  bilateral upper and lower extremities.   IMPRESSION:  1. Diffuse musculoskeletal pain, questionable fibromyalgia.  2. Acute right shoulder pain, improved.  3. Acute low back pain exacerbation in mid-December 2008.   In the office today, we did refill the patient's alprazolam and  hydrocodone as of March 07, 2008.   We will plan on seeing her in followup in approximately 3 to 60-months'  time.   She has sufficient supply of diclofenac and soma at this point.           ______________________________  Ellwood Dense, M.D.     DC/MedQ  D:  03/06/2008 11:30:24  T:  03/06/2008 11:48:29  Job #:  [id number]

## 2011-04-22 NOTE — Group Therapy Note (Signed)
MEDICAL RECORD NUMBER:  [id number].   REFERRING PHYSICIAN:  Dr. Phillips Odor at [facility].   PURPOSE OF EVALUATION:  Evaluate and treat diffuse musculoskeletal pain.   HISTORY OF PRESENT ILLNESS:  Ms. Vohs is a 50 year old adult female  referred to this office by her primary care physician, Dr. Phillips Odor, for  evaluation and treatment of chronic diffuse musculoskeletal pain.   The patient reports that she initially had onset of neck pain dating to  August 2005 which was slightly progressive over a few months time. She  reports that no participating events or trauma proceeded that neck pain. She  feels that it was related to some of the activities that she did at work.   September 06, 2004, the patient underwent a MRI scan of her cervical spine  which showed a tiny central to the left paracentral disk protrusion at C6-7  causing no neural compression. There was a large based posterior disk  herniation at C4-5, slightly greater on the left side causing no neural  compression.   November 26, 2004, the patient saw Dr. Newell Coral. She had prior surgery of  her lumbar spine with Dr. Newell Coral in 2002. His diagnosis was mild  degenerative disk disease of the cervical spine. He recommends no diskectomy  or fusion would be likely. He recommended conservative treatment and started  on Naprosyn 500 mg 1 tablet b.i.d. with follow up in one month's time.   February 28, 2005, the patient saw Dr. Phillips Odor, her primary care physician, and  complained of constant neck, hip and shoulder pain. He was referred to  rheumatology for arthralgias and myalgias.   March 04, 2005, the patient underwent a MRI scan of her brain which showed  white matter lesions mostly involving the right frontal region.  There was  periventricular white matter, possibly a demyelinating process such as MS.   March 15, 2005, the patient saw Dr. Kellie Simmering, a local rheumatologist. She had  achiness since December of 2005 with no  trauma. She was diagnosed with  polyarthralgia with insomnia. He noted no arthritic swelling and noted that  she was using Flexeril at night time to help with sleep. Discussed symptoms  of fibromyalgia with the patient.   March 22, 2005, the patient saw Dr. Salvatore Marvel, a local neurologist. Dr.  Anne Hahn did not feel that multiple sclerosis was likely. She recommended  followup scans in three months' time with and without contrast. There had  been a suggestion of a referral to [facility] management, but the patient  refused at that time.   On October 24, 2005, the patient saw Dr. Phillips Odor. In the meantime since  last clinic visit with Dr. Phillips Odor, she had treatment for left foot pain and  was started on Lorcet. She had gained good relief with the Lorcet and was  not getting much relief with the Darvocet. She requested that Dr. Phillips Odor  refill her Lorcet, and that was done at that time with no refills. He also  started on Cymbalta at 20 mg and increased her to 60 mg daily over a few  weeks time.   Presently, the patient reports that she gets a lot of relief from the Lorcet  7.5/650, use 0 to 2 tablets per day. She reports that helps most of her  pain, specifically across her shoulders and her low back. 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 She reports that she still  has some pain in her left foot and is complaining of stomachache which she  feels is related to her Cymbalta.   PAST MEDICAL HISTORY:  1.  History of degenerative disk disease of the cervical spine.  2.  History of L5-S1 lumbar surgery for herniated nucleus pulposus in 1999      by Dr. Roxan Hockey and in 2002 by Dr. Newell Coral.  3.  Carpal tunnel release in 1989 and 1990.  4.  Hypertension.  5.  Depression.  6.  Anemia.  7.  Dyslipidemia.   FAMILY HISTORY:  Positive for leukemia in her father and arthritis in her   mother.   ALLERGIES:  SULFA.   SOCIAL HISTORY:  The patient lives in [place] with her husband. She has  two grown children. She does have her GED. She does not use alcohol and  reportedly stopped smoking two years ago but did smoke this past week, had  approximately two cigarettes, related to stress. 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 She is also in a stressful relationship and feels  that she is driven to be a perfectionist both at home and at work. She also  reports that there are high expectations for maintanence of the home from  her husband.   MEDICATIONS:  1.  Lorcet Plus 7.5/650 one tablet b.i.d. p.r.n.  2.  Ferrous sulfate 325 mg b.i.d.  3.  Benicar/hydrochlorothiazide 40/12.5 one half tablet daily.  4.  Diclofenac 75 mg b.i.d.  5.  Cymbalta 60 mg daily.  6.  Zetia 10 mg daily.  7.  Fish oil 1,000 mg daily.   REVIEW OF SYSTEMS:  Positive for diarrhea occasionally, limb swelling and  sleep apnea.   PHYSICAL EXAMINATION:  GENERAL:  Reasonably well appearing, thin, adult  female in mild to moderate acute discomfort.  VITAL SIGNS:  Blood pressure 135/81 with a pulse of 85, respiratory rate 16,  and O2 saturation 99% on room air.   She ambulates without any assistive device. She is able to toe walk and heel  walk bilaterally. Upper extremity range of motion was full and pain free.  Cervical range of motion was within normal limits. Lumbar range of motion  was within normal limits.   Upper extremity exam showed normal bulk and tone throughout. Strength was 5-  /5, and reflexes were 2+ and symmetrical. Sensation was intact to light  touch throughout the bilateral lower extremities.   Lower extremity exam showed 5-/5 strength throughout. Bulk and tone were  normal, and reflexes were 2+ and symmetrical. Sensation was intact to light  touch throughout the bilateral lower extremities.   In the supine position, straight leg raise was negative bilaterally,  and hip range of motion was normal bilaterally.   IMPRESSION:  Diffuse musculoskeletal pain, questionable fibromyalgia.   At the present time, we have continued her on her Lorcet Plus at 7.5/650 one  tablet b.i.d. p.r.n. We have also given her a new prescription for  alprazolam at 1 mg to be used 1/2 tablet 1 tablet p.o. nightly. She reports  that she got too groggy on the Flexeril, and she really needs something for  her stress and to help her sleep at night. She understands that this has  potential for addiction, and she will try to use it only as absolutely  necessary. We have also encouraged  her to refrain from restarting the tobacco that she has used this  past  weekend. We will plan on seeing her in followup in approximately six weeks'  time, and she will have a refill when she calls in early January 2007.           ______________________________  Ellwood Dense, M.D.     DC/MedQ  D:  11/17/2005 11:28:17  T:  11/18/2005 11:09:01  Job #:  [id number]

## 2011-04-22 NOTE — Assessment & Plan Note (Signed)
SUBJECTIVE:  Melanie Avila returns to clinic today for follow-up evaluation.  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  She tried this job for one day and had  severe increased shoulder pain on the left side.  She was apparently  cleaning one piece that weighed 4 pounds and that slipped from her hand and  she tried to catch the piece with her left hand, injuring her left shoulder.  She reports that she has had some increased pain there over the past day or  so but is going to try to continue at that job as best she can.  She is  taking her Lorcet and actually has used slightly increased amounts of that  over the past couple of days.  She was out of her diclofenac for a day but  has resumed that now.   REVIEW OF SYSTEMS:  Noncontributory.   MEDICATIONS:  1. Lorcet Plus 7.5/650 one table t.i.d. p.r.n. (2-3 per day).  2. Iron sulfate 225 mg.  3  Benicar/hydrochlorothiazide 40/12.5 one-half tablet q.day.  1. Diclofenac 75 mg b.i.d.  2. Zetia 10 mg q.day.  3. Fish oil 1000 mg q.day.  4. Alprazolam 1 mg one-half to one tablet p.o. q.h.s. p.r.n.   PHYSICAL EXAMINATION:  GENERAL:  Well-appearing adult female complaining of  pain of her right shoulder.  VITAL SIGNS:  Blood pressure 163/95 with a pulse of 83, respiratory rate 17  and O2 saturation 99% on room air.  EXTREMITIES:  She has decreased strength in her left upper extremity and  actually guards it.  She is unable to lift it more than approximately 20  degrees from her side without complaints of pain.  She ambulates without any  assistive device in the office today.   IMPRESSION:  1. Diffuse musculoskeletal pain, questionable fibromyalgia.  2. Left acute shoulder pain after work injury this week.   PLAN:  In the office today, no refill on the diclofenac was necessary.  We  did refill the Lorcet as of October 26, 2006.  We will plan on seeing the  patient in  follow-up in this office in approximately 3 months' time with  refills prior to that appointment as necessary.  She will continue trying to  do this job but I am somewhat concerned that she may have to try a different  job as she has already injured herself on the first day of the new job.  That does not look promising for the future since she needs to stay in this  job for approximately 90 days to learn it completely.  We will plan on  seeing her in follow-up as noted above.           ______________________________  Ellwood Dense, M.D.     DC/MedQ  D:  10/19/2006 10:57:00  T:  10/19/2006 11:36:20  Job #:  [id number]

## 2011-04-22 NOTE — Assessment & Plan Note (Signed)
HISTORY OF PRESENT ILLNESS:  Mrs. Melanie Avila returns to the clinic today for  follow up evaluation.  I first and last saw her in this office November 17, 2005, on referral from Dr. Phillips Odor.  She has history of musculoskeletal pain  with questionable fibromyalgia.  We had continued her on her hydrocodone  7.5/650 one tablet b.i.d.  That is on an as needed basis, and she generally  uses anywhere from 0-2 tablets per day.  She does not need a refill on that  in the office today.  She does take her alprazolam for sleep, and she needs  a refill.  She also needs a refill on her diclofenac.  She has stopped  Cymbalta, as she actually felt worse with that when it had been substituted  for other antidepressant medications by her primary care physician.   REVIEW OF SYSTEMS:  Noncontributory.   MEDICATIONS:  1.  Lorcet Plus 7.5/650 one tablet b.i.d. p.r.n. (0-2 per day).  2.  Iron sulfate 225 mg daily.  3.  Benicar/hydrochlorothiazide 40/12.5 one half tablet daily.  4.  Diclofenac 75 mg b.i.d.  5.  Zetia 10 mg daily.  6.  Fish oil 1000 mg daily.   PHYSICAL EXAMINATION:  GENERAL APPEARANCE:  Well-appearing, fit, adult  female in mild to no acute discomfort.  Blood pressure and vitals were not  obtained.  The patient has 5-/5 strength throughout the bilateral upper and  lower extremities.  Bulk and tone were normal and reflexes were 2+ and  symmetrical.  She does have some diffuse musculoskeletal pain on palpation.   IMPRESSION:  Diffuse musculoskeletal pain, questionable fibromyalgia.   HISTORY OF PRESENT ILLNESS:  In the office today, we did not have to refill  the hydrocodone or Lorcet.  We did refill her Alprazolam as of January 05, 2006, and her diclofenac as of today.  She will be continuing off the  Cymbalta at this point.  She has not noticed any rebound depression.  We  plan on seeing her in follow up in approximately two months time with refill  medication prior to that appointment as  necessary.           ______________________________  Ellwood Dense, M.D.     DC/MedQ  D:  12/30/2005 11:50:11  T:  12/30/2005 13:14:42  Job #:  [id number]

## 2011-04-22 NOTE — Assessment & Plan Note (Signed)
Ms. Kuenzel returns to clinic today for followup evaluation.  She has had  an acute exacerbation of her low back pain dating approximately 2 weeks  ago.  She was seen by her primary care physician and was given a Toradol  injection, which gave her some temporary relief.  She also was  prescribed some Soma by her primary care physician.  She has tried to  use that occasionally, but it does make her sleepy when she uses it at  full tablet strength.  She continues to try to work at her regular job,  but is having more problems.  Her mother had a friend who has tried  Elavil in the past and supposedly got good results for that for her  fibromyalgia.  The patient is interested in trying a low dose of the  Elavil over the next several days.  She does continue to take her Lorcet  with her alprazolam, and has a sufficient supply.  She does ask  questions about possibly having relief from work duties for a few days  to get over this exacerbation.   REVIEW OF SYSTEMS:  Noncontributory.   MEDICATIONS:  1. Lorcet Plus 7.5/650 one tablet t.i.d. p.r.n. (3 per day).  2. Iron sulfate 225 mg daily.  3. Benicar/hydrochlorothiazide 40/12.5 one-half tablet daily.  4. Diclofenac 75 mg b.i.d..  5. Zetia 10 mg daily.  6. Fish oil 1000 mg daily.  7. Alprazolam 1/2-1 tablet p.o. nightly.  8. Soma 350 mg 1 tablet t.i.d. p.r.n.   PHYSICAL EXAMINATION:  Well-appearing, adult female complaining of pain  in her low back at the present time.  Blood pressure is 174/100 with a pulse of 96, respiratory rate of 16,  and O2 saturation 96% on room air.  She ambulates without any assistive device with an antalgic gait.   IMPRESSION:  1. Diffuse musculoskeletal pain, questionable fibromyalgia.  2. Left acute shoulder pain after work, resolved.  3. Acute low back pain exacerbation.   In the office today, we did give the patient a prescription for the  amitriptyline 25 mg 1 tablet p.o. nightly, but I have asked her to  hold  off on starting that for the time being until she adjusts the dose of  the Soma.  Ideally, she would get some benefit from the Chrisman, and not  experience the sleepy side effects.  She will be trying the [facility] by  itself before she adds in the amitriptyline.  We have also asked her  work to excuse her from work January 11, 2007 through January 16, 2007,  then return to the p.m. shift January 17, 2007.  We will plan on seeing  the patient in followup in this office in approximately 2-3 months'  time, with refills prior to that appointment if necessary.           ______________________________  Ellwood Dense, M.D.     DC/MedQ  D:  01/11/2007 12:03:51  T:  01/11/2007 14:07:32  Job #:  [id number]

## 2011-04-22 NOTE — Assessment & Plan Note (Signed)
Melanie Avila returns to clinic today for follow up evaluation.  She reports  that overall she is doing okay.  She reports that she continues to take  the Lorcet, diclofenac and Soma as noted below.  Generally the Tresa Garter is  used anywhere from 1-3 tablets per day.  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  She reports that  the job is less physically stressful but she is not sure if it is not  going to be any more stressful overall in terms of pressure.  She does  not have production numbers that she needs to meet at the present time.   REVIEW OF SYSTEMS:  Noncontributory.   MEDICATIONS:  Include:  1. Lorcet Plus 7.5/650 1 tablet t.i.d. p.r.n. (3 per day).  2. Iron sulfate 225 mg once a day.  3. Benicar/hydrochlorothiazide 40/12.5 1/2 tablet once a day.  4. Diclofenac 75 mg b.i.d.  5. Zetia 10 mg once a day.  6. Fish oil 1000 mg once a day.  7. Alprazolam 1/2 to 1 tablet p.o. q.h.s.  8. Soma 350 mg t.i.d. p.r.n. (1-3 per day).   PHYSICAL EXAMINATION:  Well-appearing, fit adult female in mild acute  discomfort.  Blood pressure 140/75 with a pulse of 82, respiratory rate  18 and O2 saturation 99% on room air.  She ambulates without any  assistive device.  She has 5-/5 strength throughout.   IMPRESSION:  1. Diffuse musculoskeletal pain, questionable fibromyalgia.  2. Acute shoulder pain after work, resolved.  3. Acute low back pain exacerbation, improved.   In the office today no refill on medications is necessary.  We will plan  on seeing the patient in follow up in approximately three months' time  with refills prior to that appointment as necessary.           ______________________________  Ellwood Dense, M.D.     DC/MedQ  D:  04/04/2007 10:46:51  T:  04/04/2007 11:33:06  Job #:  [id number]

## 2011-04-22 NOTE — Assessment & Plan Note (Signed)
INTERVAL HISTORY:  [name] returns to clinic today for followup  evaluation.  She reports that she is doing well overall.  She gets  reasonable relief using her Lorcet Plus at 7.5/650 approximately one to two  tablets per day.  She has days when she would like to use an additional  tablet but she is worried about overusing the medicines and running short.  She reports that she has had two ingrown toenails with surgery recently and  that has caused slight increased pain.  She is not reporting any grogginess  nor any constipation with the Lorcet Plus.  She also uses the alprazolam 1  mg one half tablet to one tablet at night and gets a good bit of benefit in  terms of her sleep.  She takes diclofenac but does not need a refill on that  in the office today.   REVIEW OF SYSTEMS:  Noncontributory.   MEDICATIONS:  1.  Lorcet Plus 7.5/650 one tablet b.i.d. p.r.n. (one to two per day).  2.  Iron sulfate 225 mg daily.  3.  Benicar/hydrochlorothiazide 40/12.5 one half tablet daily.  4.  Diclofenac 75 mg b.i.d.  5.  Zetia 10 mg daily.  6.  Fish oil 1000 mg daily.   PHYSICAL EXAMINATION:  Well-appearing fit adult female in mild to no acute  discomfort.  Blood pressure 138/89 with a pulse of 92, respiratory rate 17  and O2 saturation 100% on room air.  She has 5-/5 strength throughout the  bilateral upper and lower extremities.  Reflexes were 2+ and symmetrical.   IMPRESSION:  Diffuse musculoskeletal pain, questionable fibromyalgia.   In the office today, we did refill her Lorcet Plus and increased it up to  one tablet three times per day as necessary with total of 90 as of March 06, 2006.  We also refilled her alprazolam in the office today.  We will plan on  seeing the patient in followup in this office in approximately 2 months'  time with refills prior to that appointment as necessary.           ______________________________  Ellwood Dense, M.D.     DC/MedQ  D:  02/24/2006  15:34:07  T:  02/25/2006 21:45:27  Job #:  [id number]

## 2011-04-22 NOTE — Assessment & Plan Note (Signed)
FOLLOWUP OFFICE NOTE   Ms. Boerema returns to the clinic today for followup evaluation.  Between the  last clinic visit on February 24, 2006, we have increased her Lorcet Plus up to  1 tablet 3 times a day as needed.  She reports that she has generally been  using it 2-3 times per day but only 3 times if she absolutely needs it.  She  reports mostly good days, and reports that she could not continue doing her  work without the pain medicines.  She does not need a refill in the office  today.   REVIEW OF SYSTEMS:  Noncontributory.   MEDICATIONS:  1.  Lorcet Plus 7.5/650, 1 tablet t.i.d. p.r.n. (2-3 per day).  2.  Iron sulfate 225 mg daily.  3.  Benicar/hydrochlorothiazide 40/12.5, 1/2 tablet daily  4.  Diclofenac 75 mg b.i.d.  5.  Zetia 10 mg daily.  6.  Fish oil 1000 mg daily.   PHYSICAL EXAMINATION:  GENERAL:  Well-appearing, fit adult female in no  acute discomfort.  VITAL SIGNS:  Blood pressure 161/98 with a pulse of 88, respiratory rate 16,  and O2 saturation of 99% on room air.  NEUROLOGIC:  She has 5-/5 strength throughout the bilateral upper and lower  extremities.  Reflexes are 2+ and symmetrical, and sensation is intact to  light touch throughout.   IMPRESSION:  Diffuse musculoskeletal pain, questionable fibromyalgia.   In the office today, no refill of medicines is necessary.  We will plan on  seeing her in followup in approximately 3 months' time with refills prior to  that appointment if necessary.  She will be calling in at the end of May for  the next refill in early June.           ______________________________  Ellwood Dense, M.D.     DC/MedQ  D:  04/20/2006 10:37:53  T:  04/20/2006 22:26:59  Job #:  [id number]

## 2011-04-22 NOTE — Assessment & Plan Note (Signed)
Fabian is back regarding her fibromyalgia and chronic pain.  From a work  standpoint in her fibromyalgia, she has done fairly well.  Her mother  has been sick in the hospital for a couple of weeks and this has added a  lot of stress to her.  She notes that she has had some swelling and pain  in her ankles with associated rash.  Pain is 4-5/10.  Describes as sharp  and aching.  Pain interferes with general activity, relations with  others, enjoyment of life on a moderate level.  The patient is working  about 40+ hours a week still.   REVIEW OF SYSTEMS:  Notable for some weakness.  Other pertinent  positives are above and full review is in the written health and history  section of the chart.   SOCIAL HISTORY:  Noted above.   PHYSICAL EXAMINATION:  VITAL SIGNS:  Blood pressure is 149/78, pulse is  85, respiratory rate 18, she is sating 98% on room air.  GENERAL:  The patient is pleasant, alert, and oriented x3.  Affect is  bright and appropriate.  She does appear a bit fatigued, but still is  very pleasant.  MUSCULOSKELETAL:  She has some continued elevation of the right  hemipelvis on the left, although she has her 2 heel inserts in the left  shoe and is a bit more balanced than she was previously.  She has tender  points throughout.  The legs are notable for some mild small petechiae.  Distally to this, there is an area with irritation and small bullae.  Edema is trace to 1+ at the ankles.  No bruising is noted that I can see  there.  She has a few spider veins more proximally in the legs.  HEART:  Regular.  CHEST:  Clear.  ABDOMEN:  Soft, nontender.  NEURO:  Cognitively, she is appropriate.   ASSESSMENT:  1. fibromyalgia syndrome.  2. Right rotator cuff syndrome.  3. Chronic low back pain and left leg length discrepancy.  4. Bullae and rash on distal legs.   PLAN:  1. I advised her that this is some type of contact reaction.      Certainly the stress of the recent weeks has  not helped either.      Recommended elevation and local hydrocortisone to the ankles.  If      these are persistent, she may need to see a dermatologist.  2. I refilled her hydrocodone the other day.  She will stay with the      [facility] for now as well as Soma and ReQuip.  3. I will see her back in about 3 months' time.  She will call me back      with any problems sooner.      Ranelle Oyster, M.D.  Electronically Signed     ZTS/MedQ  D:  08/19/2009 10:56:53  T:  08/20/2009 03:01:29  Job #:  [id number]

## 2011-04-22 NOTE — Assessment & Plan Note (Signed)
Melanie Avila returns to clinic today for followup evaluation.  She reports that  overall she is getting good relief from the Lorcet use, 2-3 tablets per day.  She also takes the Diclofenac twice a day and the alprazolam p.r.n.  She  does need refills on each of those in the office.  Her last refill was June 30, 2006.   REVIEW OF SYSTEMS:  Non-contributory.   MEDICATIONS:  1. Lorcet Plus 7.5/650 one tablet t.i.d. p.r.n. (2-3 per day).  2. Iron Sulfate 225 mg q. day.  3. Benicar/hydrochlorothiazide 40/12.5 - 1/2 tablet q. day.  4. Diclofenac 75 mg b.i.d.  5. Zetia 10 mg q. day.  6. Fish oil 1000 mg q. day.  7. Alprazolam 1 mg 1/2-1 tablet p.o. q. h.s. p.r.n.   PHYSICAL EXAMINATION:  GENERAL:  Well-appearing, fit adult female in mild to  no acute discomfort.  VITAL SIGNS:  Blood pressure 144/85 with a pulse of 88, respiratory rate 16.  02 saturations 98% on room air.  EXTREMITIES:  She has 5-/5 strength at the bilateral upper and lower  extremities. Reflexes were depressed and symmetrical and sensation was  intact to light touch throughout.  She ambulates without any assistive  device.   IMPRESSION:  Diffuse musculoskeletal pain, questionable fibromyalgia.   In the office today we did refill the patient's Lorcet, Diclofenac, and  alprazolam as of July 29, 2006.  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  We will plan on  seeing her in followup in approximately 3 months' time with refill of  medication prior to that appointment as necessary.           ______________________________  Ellwood Dense, M.D.     DC/MedQ  D:  07/21/2006 13:31:27  T:  07/21/2006 14:04:46  Job #:  [id number]

## 2011-05-16 ENCOUNTER — Encounter
Payer: BC Managed Care – PPO | Attending: Physical Medicine & Rehabilitation | Admitting: Physical Medicine & Rehabilitation

## 2011-05-16 DIAGNOSIS — M719 Bursopathy, unspecified: Secondary | ICD-10-CM | POA: Insufficient documentation

## 2011-05-16 DIAGNOSIS — IMO0001 Reserved for inherently not codable concepts without codable children: Secondary | ICD-10-CM | POA: Insufficient documentation

## 2011-05-16 DIAGNOSIS — M545 Low back pain, unspecified: Secondary | ICD-10-CM | POA: Insufficient documentation

## 2011-05-16 DIAGNOSIS — M2170 Unequal limb length (acquired), unspecified site: Secondary | ICD-10-CM | POA: Insufficient documentation

## 2011-05-16 DIAGNOSIS — M25519 Pain in unspecified shoulder: Secondary | ICD-10-CM | POA: Insufficient documentation

## 2011-05-16 DIAGNOSIS — G8929 Other chronic pain: Secondary | ICD-10-CM | POA: Insufficient documentation

## 2011-05-16 DIAGNOSIS — M7530 Calcific tendinitis of unspecified shoulder: Secondary | ICD-10-CM

## 2011-05-16 DIAGNOSIS — M5382 Other specified dorsopathies, cervical region: Secondary | ICD-10-CM

## 2011-05-16 DIAGNOSIS — M67919 Unspecified disorder of synovium and tendon, unspecified shoulder: Secondary | ICD-10-CM | POA: Insufficient documentation

## 2011-05-17 NOTE — Assessment & Plan Note (Signed)
Melanie Avila is back regarding her fibromyalgia.  She did not tolerate the fentanyl patch well as it caused itching, rash and she did notice a big difference in the pain control either.  She continues to have a lot of stress at home revolving her mother and lack of help she receives in her care.  She rates her pain as 6-8/10, described as aching and stabbing. She has had some recent pain along the left shoulder blade which felt as if it walked up her spasm.  Sleep has been poor.  She reports depression and anxiety as well.  REVIEW OF SYSTEMS:  Notable for the above.  Full 12-point review is in the written health and history section of the chart.  SOCIAL HISTORY:  Noted above.  PHYSICAL EXAMINATION:  VITAL SIGNS:  Blood pressure is 156/104, pulse 71, respiratory rate 18 and she is satting 98% on room air. GENERAL:  The patient is pleasant, appears fatigued her usual.  She remains pleasant. EXTREMITIES:  Strength is generally 4-5/5 throughout except for plantar flexion of 3-5 at the left ankle.  She has pain in the lumbar spine. Left shoulder blade is depressed and inferior to the right shoulder is somewhat tender to palpation. NEUROLOGIC:  Cognitively, she is alert and appropriate. HEART:  Regular. CHEST:  Clear. ABDOMEN:  Soft and nontender.  ASSESSMENT: 1. Fibromyalgia syndrome. 2. Chronic low back pain with left S1 radiculopathy. 3. Left leg length discrepancy. 4. Right rotator cuff syndrome. 5. Myofascial shoulder girdle pain.  PLAN: 1. I talked about mood and anxiety/depression and she understands that     she needs to find some time for herself.  I talked about some     respit care for her mother which may be in order here. 2. We will stop the fentanyl patch and we will begin Kadian 20 mg q.12     h.  Her goals are to reduce her hydrocodone substantially.  My goal     is to reduce this to 1-3 times a day.  We will titrate Kadian up as     needed going forward. 3. Continue with  other medications as prescribed. 4. I reviewed some left shoulder girdle stretching exercises as well     as modalities.  She can try at home.  No indications for trigger     point injections were seen today. 5. She will see my nurse practitioner back in about 1 month.     Ranelle Oyster, M.D. Electronically Signed    ZTS/MedQ D:  05/16/2011 12:11:18  T:  05/17/2011 01:35:04  Job #:  [id number]

## 2011-06-14 ENCOUNTER — Encounter: Payer: BC Managed Care – PPO | Attending: Neurosurgery | Admitting: Neurosurgery

## 2011-06-14 DIAGNOSIS — M545 Low back pain, unspecified: Secondary | ICD-10-CM | POA: Insufficient documentation

## 2011-06-14 DIAGNOSIS — IMO0001 Reserved for inherently not codable concepts without codable children: Secondary | ICD-10-CM | POA: Insufficient documentation

## 2011-06-14 DIAGNOSIS — M25519 Pain in unspecified shoulder: Secondary | ICD-10-CM | POA: Insufficient documentation

## 2011-06-14 DIAGNOSIS — M25529 Pain in unspecified elbow: Secondary | ICD-10-CM

## 2011-06-14 DIAGNOSIS — M67919 Unspecified disorder of synovium and tendon, unspecified shoulder: Secondary | ICD-10-CM | POA: Insufficient documentation

## 2011-06-14 DIAGNOSIS — M5430 Sciatica, unspecified side: Secondary | ICD-10-CM

## 2011-06-14 DIAGNOSIS — M2170 Unequal limb length (acquired), unspecified site: Secondary | ICD-10-CM | POA: Insufficient documentation

## 2011-06-14 DIAGNOSIS — M719 Bursopathy, unspecified: Secondary | ICD-10-CM | POA: Insufficient documentation

## 2011-06-15 NOTE — Assessment & Plan Note (Signed)
Melanie Avila is a patient of Dr. Riley Kill with chronic pain fibromyalgia. She states her pain is somewhat improved, but she cannot take the Kadian.  He started her on at last appointment or so that she states she has had severe constipation and does not want to take that medicine anymore.  She rates her pain is about 7, it is constant with paresthesia- like feeling, activity level is 4.  Sleep pattern is fair.  Pain is worse in the morning.  Walking, standing, and some activities aggravate. Medication, heat, and therapy tend to help.  She can walk without assistance.  She can climb steps and drive.  She is employed as a Health and safety inspector.  REVIEW OF SYSTEMS:  Notable for difficulties described above as well as some constipation.  No suicidal thoughts or aberrant behaviors.  PAST MEDICAL HISTORY:  Unchanged.  SOCIAL HISTORY:  Married.  FAMILY HISTORY:  Unchanged.  Physical exam; blood pressure 167/103, pulse 78, respirations 18, O2 sats 99 on room air.  Her strength and sensation are good in lower extremities.  Constitutionally, she is within normal limits.  She is alert and oriented x3.  She walks with a mild limp.  ASSESSMENT: 1. Fibromyalgia. 2. Chronic low back pain. 3. Left leg discrepancy. 4. Rotator cuff syndrome on the right. 5. Myofascial shoulder pain.  PLAN: 1. We are going to try her on Opana.  She states she talked about with     Dr. Riley Kill but he states some patient has had trouble with it but     she has never been on, so we are going to try with Opana ER 20 mg 1     p.o. q.12 h. 60 with no refill. 2. Hydrocodone 10/325 one to two p.o. q.4 h. p.r.n. 10 with no refill. 3. She will continue her other medication regimen as prescribed. 4. Questions were answered.  She will follow up here in the clinic in     1 month.     Jamarian Jacinto L. Blima Dessert Electronically Signed    RLW/MedQ D:  06/14/2011 11:15:00  T:  06/14/2011 23:58:52  Job #:  [id number]

## 2011-07-12 ENCOUNTER — Encounter: Payer: BC Managed Care – PPO | Attending: Neurosurgery | Admitting: Neurosurgery

## 2011-07-12 DIAGNOSIS — R269 Unspecified abnormalities of gait and mobility: Secondary | ICD-10-CM | POA: Insufficient documentation

## 2011-07-12 DIAGNOSIS — IMO0002 Reserved for concepts with insufficient information to code with codable children: Secondary | ICD-10-CM | POA: Insufficient documentation

## 2011-07-12 DIAGNOSIS — M545 Low back pain, unspecified: Secondary | ICD-10-CM | POA: Insufficient documentation

## 2011-07-12 DIAGNOSIS — G8929 Other chronic pain: Secondary | ICD-10-CM | POA: Insufficient documentation

## 2011-07-12 DIAGNOSIS — M2170 Unequal limb length (acquired), unspecified site: Secondary | ICD-10-CM | POA: Insufficient documentation

## 2011-07-12 DIAGNOSIS — G894 Chronic pain syndrome: Secondary | ICD-10-CM

## 2011-07-12 DIAGNOSIS — M5430 Sciatica, unspecified side: Secondary | ICD-10-CM

## 2011-07-12 DIAGNOSIS — IMO0001 Reserved for inherently not codable concepts without codable children: Secondary | ICD-10-CM

## 2011-07-12 NOTE — Assessment & Plan Note (Signed)
This patient is of Dr. Riley Kill, seen for chronic pain and fibromyalgia. She states she has had some increasing low back pain and right radicular pain.  She went to see her primary care doctor who has referred her to see Dr. Venita Lick, [facility] in about 2-3 weeks, however, she tells me she has had hip surgery with Dr. Newell Coral at [facility] and would like to return there.  She averages her pain at 5-6, now it is a sharp, constant pain with aching.  General activity level is 4-5.  Sleep patterns are poor.  Pain is worse in the morning, sitting and standing tend to aggravate; medication does help. She walks with assistance.  She is walking with a limp.  She works full- time greater than 40 hours a week.  She needs help with household duties.  REVIEW OF SYSTEMS:  Notable for difficulties as described above as well as some paresthesias, spasm, depression.  No suicidal thoughts or aberrant behaviors.  Past medical history, social history, and family history are unchanged.  PHYSICAL EXAMINATION:  VITAL SIGNS:  Blood pressure 171/85, pulse 86, respirations 18, O2 sats 99 on room air. MUSCULOSKELETAL:  Motor strength is 5/5 in the lower extremities.  She has positive straight leg raising on the right. Her sensation is intact. GENERAL:  Constitutionally, she is within normal limits.  She is alert and oriented x3.  ASSESSMENT: 1. Fibromyalgia. 2. Chronic low back pain. 3. Acute right radicular pain. 4. Left leg discrepancy. 5. History of myofascial shoulder pain.  PLAN: 1. We will continue the Opana 20 mg ER 1 p.o. q.12 hours, 60 with no     refill. 2. Hydrocodone 10/325, 1-2 p.o. q.4 hours p.r.n., 210 with no refill. 3. We are going to order MRI of lumbar spine with gadolinium and she     will self-refer back to Dr. Shirlean Kelly for evaluation after     her MRI.  She is in agreement with this.  Her questions were     encouraged and answered.  She will  follow up here in 1 month.     Daniel Johndrow L. Blima Dessert Electronically Signed    RLW/MedQ D:  07/12/2011 11:50:36  T:  07/12/2011 23:28:18  Job #:  [id number]

## 2011-07-13 ENCOUNTER — Other Ambulatory Visit: Payer: Self-pay | Admitting: Physical Medicine & Rehabilitation

## 2011-07-13 DIAGNOSIS — M545 Low back pain, unspecified: Secondary | ICD-10-CM

## 2011-07-22 ENCOUNTER — Ambulatory Visit (HOSPITAL_COMMUNITY)
Admission: RE | Admit: 2011-07-22 | Discharge: 2011-07-22 | Disposition: A | Discharge status: Home / Self Care | Payer: BC Managed Care – PPO | Source: Ambulatory Visit | Attending: Physical Medicine & Rehabilitation | Admitting: Physical Medicine & Rehabilitation

## 2011-07-22 DIAGNOSIS — M545 Low back pain, unspecified: Secondary | ICD-10-CM | POA: Insufficient documentation

## 2011-07-22 DIAGNOSIS — M5126 Other intervertebral disc displacement, lumbar region: Secondary | ICD-10-CM | POA: Insufficient documentation

## 2011-07-22 MED ORDER — GADOBENATE DIMEGLUMINE 529 MG/ML IV SOLN
17.0000 mL | Freq: Once | INTRAVENOUS | Status: AC | PRN
  Administered 2011-07-22: 17 mL via INTRAVENOUS

## 2011-08-17 ENCOUNTER — Encounter
Payer: BC Managed Care – PPO | Attending: Physical Medicine & Rehabilitation | Admitting: Physical Medicine & Rehabilitation

## 2011-08-17 DIAGNOSIS — F329 Major depressive disorder, single episode, unspecified: Secondary | ICD-10-CM

## 2011-08-17 DIAGNOSIS — M5382 Other specified dorsopathies, cervical region: Secondary | ICD-10-CM

## 2011-08-17 DIAGNOSIS — M545 Low back pain, unspecified: Secondary | ICD-10-CM | POA: Insufficient documentation

## 2011-08-17 DIAGNOSIS — M961 Postlaminectomy syndrome, not elsewhere classified: Secondary | ICD-10-CM

## 2011-08-17 DIAGNOSIS — IMO0002 Reserved for concepts with insufficient information to code with codable children: Secondary | ICD-10-CM | POA: Insufficient documentation

## 2011-08-17 DIAGNOSIS — IMO0001 Reserved for inherently not codable concepts without codable children: Secondary | ICD-10-CM | POA: Insufficient documentation

## 2011-08-17 DIAGNOSIS — G8929 Other chronic pain: Secondary | ICD-10-CM | POA: Insufficient documentation

## 2011-08-17 NOTE — Assessment & Plan Note (Signed)
Melanie Avila is here regarding her generalized pain.  She did have increase in back and right leg pain starting few weeks ago when she transferred her mother at home.  She saw my nurse practitioner who ordered an MRI of her spine.  The MRI was notable for mild disk bulge and ligamentum flavum thickening at L4-L5.  At L5-S1, her post laminectomy and diskectomy signs are noted.  She has a mild disk bulge eccentric to the right with central canal widely patent.  She has moderate left foraminal narrowing and mild right foraminal narrowing.  The patient reports increased pain along the low back into the right hip along the posterior aspect of the right leg into the foot.  Her pain is 7/10.  It is sharp, stabbing, aching.  It is made worse with prolonged sitting or lying.  Her sleep is fair.  She has been out of work due to the back.  REVIEW OF SYSTEMS:  Notable for spasm, depression, anxiety, weakness. Full 12-point review is in the written health and history section of the chart.  SOCIAL HISTORY:  The patient is married and no other changes are noted.  PHYSICAL EXAMINATION:  Blood pressure is 158/95, pulse 83, respiratory rate 18, she is saturating 100% on room air.  The patient is pleasant, alert and oriented x3.  She has definite antalgia on the right leg with weightbearing.  She has intact strength in the right lower extremity. She does have pain inhibition, however, proximally at the hip and knee. Reflexes are 2+ patellar tendon and Achilles tendon.  Left lower extremity is 2+ at the knee, trace at the ankle for reflexes.  Sensation is grossly intact.  She has a positive straight leg raise and seated slump test today on the right.  Back is diffusely tender along the L5 paraspinals into the right buttock region.  She is very limited with movement in all planes, the last part of lumbar spine is a concern. Cognitively, she is alert and appropriate.  ASSESSMENT: 1. Fibromyalgia. 2. Chronic low  back pain with post laminectomy syndrome.  She now has     acute S1 radiculopathy.  MRI is generally unimpressive.  PLAN: 1. We will set the patient up for L5-S1 translaminar right-paracentral     epidural steroid injection to target the descending S1 nerve root     on the right. 2. The patient has an appointment with Dr. Newell Coral next week,     although I do not see that she is a surgical candidate. 3. I wrote her a handwritten prednisone taper.  I encouraged her to     work on stretching and range of motion to tolerance. 4. We will increase Lyrica to 100 mg q.i.d. for now. 5. I refilled Opana ER 20 q.12 h., #60 with hydrocodone 10 q.4 h.     p.r.n. #210. 6. Refilled diclofenac today. 7. I will see her back pending injection above.     Ranelle Oyster, M.D. Electronically Signed    ZTS/MedQ D:  08/17/2011 11:13:36  T:  08/17/2011 11:24:25  Job #:  [id number]  cc:   Hewitt Shorts, M.D. Fax: [phone]

## 2011-09-15 ENCOUNTER — Encounter: Payer: BC Managed Care – PPO | Attending: Neurosurgery

## 2011-09-15 ENCOUNTER — Ambulatory Visit (HOSPITAL_BASED_OUTPATIENT_CLINIC_OR_DEPARTMENT_OTHER): Payer: BC Managed Care – PPO | Admitting: Physical Medicine & Rehabilitation

## 2011-09-15 DIAGNOSIS — M545 Low back pain, unspecified: Secondary | ICD-10-CM | POA: Insufficient documentation

## 2011-09-15 DIAGNOSIS — M719 Bursopathy, unspecified: Secondary | ICD-10-CM | POA: Insufficient documentation

## 2011-09-15 DIAGNOSIS — IMO0001 Reserved for inherently not codable concepts without codable children: Secondary | ICD-10-CM | POA: Insufficient documentation

## 2011-09-15 DIAGNOSIS — M2170 Unequal limb length (acquired), unspecified site: Secondary | ICD-10-CM | POA: Insufficient documentation

## 2011-09-15 DIAGNOSIS — M67919 Unspecified disorder of synovium and tendon, unspecified shoulder: Secondary | ICD-10-CM | POA: Insufficient documentation

## 2011-09-15 DIAGNOSIS — IMO0002 Reserved for concepts with insufficient information to code with codable children: Secondary | ICD-10-CM

## 2011-09-15 DIAGNOSIS — M25519 Pain in unspecified shoulder: Secondary | ICD-10-CM | POA: Insufficient documentation

## 2011-09-15 NOTE — Procedures (Signed)
[name], [name] NO.:  [id number]  MEDICAL RECORD NO.:  [id number]           PATIENT TYPE:  O  LOCATION:  [facility]                          FACILITY:  [facility]  PHYSICIAN:  Erick Colace, M.D.DATE OF BIRTH:  05/04/1961  DATE OF PROCEDURE:  09/15/2011 DATE OF DISCHARGE:                              OPERATIVE REPORT  PROCEDURE:  Right L5-S1 translaminar lumbar epidural steroid injection under fluoroscopic guidance.  REASON FOR PROCEDURE:  Right S1 radiculitis.  Informed consent was obtained after describing risks and benefits of the procedure with the patient.  These include bleeding, bruising, and infection.  She has severe pain which persists despite medication management including narcotic analgesics.  She rates her pain is 6 to 7/10.  Worsening with standing and sitting and.  MRI reviewed.  MEDICATIONS:  Reviewed, no anticoagulants.   Informed consent was obtained after describing risks and benefits of the procedure with the patient.  These include bleeding, bruising, infection as well as temporary or permanent paralysis, she elects to proceed and has given written consent.  The patient placed prone on fluoroscopy table.  Betadine prep, sterile drape, 25-gauge inch and half needle was used to anesthetize the skin and subcu tissue 1% lidocaine x2 mL.  Then, a 17-gauge Tuohy needle was inserted under fluoroscopic guidance starting the right L5-S1 interlaminar space.  AP and lateral images utilized.  Once needle tip approximated posterior elements, loss of resistance technique was utilized with 50:50 air saline mix.  Possible loss resistance was obtained and confirmed with 2 mL of Omnipaque 180 demonstrated good epidural spread followed by injection of as well as nerve root spread followed by injection of 2 mL of 40 mg/mL Depo-Medrol and 2 mL of 1% MPF lidocaine.  The patient tolerated procedure well. Postprocedure instructions given.  Opana ER refilled  today, platelet counts appropriate, follow up with Dr. Riley Kill in 1 month.     Erick Colace, M.D. Electronically Signed    AEK/MEDQ  D:  09/15/2011 14:22:35  T:  09/15/2011 15:46:47  Job:  [id number]  cc:   Ranelle Oyster, M.D. Fax: [phone]  Hewitt Shorts, M.D. Fax: [phone]

## 2011-09-29 ENCOUNTER — Emergency Department (HOSPITAL_COMMUNITY)
Admission: EM | Admit: 2011-09-29 | Discharge: 2011-09-29 | Discharge status: Against Medical Advice | Payer: BC Managed Care – PPO | Attending: Emergency Medicine | Admitting: Emergency Medicine

## 2011-09-29 ENCOUNTER — Encounter: Payer: Self-pay | Admitting: Emergency Medicine

## 2011-09-29 DIAGNOSIS — R29898 Other symptoms and signs involving the musculoskeletal system: Secondary | ICD-10-CM | POA: Insufficient documentation

## 2011-09-29 DIAGNOSIS — R52 Pain, unspecified: Secondary | ICD-10-CM | POA: Insufficient documentation

## 2011-09-29 DIAGNOSIS — R6883 Chills (without fever): Secondary | ICD-10-CM | POA: Insufficient documentation

## 2011-09-29 HISTORY — DX: Fibromyalgia: M79.7

## 2011-09-29 NOTE — ED Notes (Signed)
Pt c/o feeling bad since taking the flu shot Tuesday. Pt c/o chills, body aches and states her hands are locking up.

## 2011-09-29 NOTE — ED Notes (Signed)
Per registration pt left AMA 

## 2011-10-12 ENCOUNTER — Encounter: Payer: BC Managed Care – PPO | Attending: Neurosurgery | Admitting: Neurosurgery

## 2011-10-12 DIAGNOSIS — G8929 Other chronic pain: Secondary | ICD-10-CM | POA: Insufficient documentation

## 2011-10-12 DIAGNOSIS — M961 Postlaminectomy syndrome, not elsewhere classified: Secondary | ICD-10-CM | POA: Insufficient documentation

## 2011-10-12 DIAGNOSIS — M545 Low back pain, unspecified: Secondary | ICD-10-CM

## 2011-10-12 DIAGNOSIS — IMO0001 Reserved for inherently not codable concepts without codable children: Secondary | ICD-10-CM

## 2011-10-12 DIAGNOSIS — G894 Chronic pain syndrome: Secondary | ICD-10-CM

## 2011-10-12 NOTE — Assessment & Plan Note (Signed)
HISTORY OF PRESENT ILLNESS:  This is a patient of Dr. Riley Kill seen for fibromyalgia and generalized pain.  She reports no change in her pain at 6.  It is sharp, stabbing, constant pain with aching sensation.  General activity level is 4-6.  Pain is worse in the morning.  Sleep patterns are fair.  Pain is worse with sitting and standing.  Rest and medication tend to help.  She does climb steps and drive.  She can walk a couple of minutes at a time.  She is employed full time now.  She is still grieving over the death of her mother, but otherwise doing better.  She does need some help with household duties.  REVIEW OF SYSTEMS:  Notable for difficulties described above as well as some weakness, spasm, depression, anxiety.  No suicidal thoughts or aberrant behaviors.  Pill count and UDS were correct.  PAST MEDICAL HISTORY:  Unchanged.  SOCIAL HISTORY:  Unchanged.  FAMILY HISTORY:  Unchanged.  PHYSICAL EXAMINATION:  VITAL SIGNS:  Her blood pressure is 150/79, pulse 78, respirations 16, O2 sats 97 on room air. NEUROLOGIC:  Her motor strength and sensation are intact in upper and lower extremities.  Constitutionally, she is within normal limits.  She is alert and oriented x3.  She has a fairly normal gait.  She does appear to be a little slow to arise from a seated position.  ASSESSMENT: 1. Fibromyalgia. 2. Chronic pain post laminectomy syndrome.  PLAN: 1. She just got her hydrocodone refilled.  We will continue that as     she needs. 2. Refill Opana ER 20 mg 1 p.o. q.12 hours 60 with no refill.  She will continue her other medications as prescribed.  She did ask about switching back to [facility] from baclofen, and she states she will discuss that with Dr. Riley Kill at her next appointment.  Otherwise, her questions were encouraged and answered.  We will see her in a month.     Jazara Swiney L. Blima Dessert     Ranelle Oyster, M.D. Electronically Signed   RLW/MedQ D:  10/12/2011  10:59:43  T:  10/12/2011 11:30:15  Job #:  [id number]

## 2011-11-04 ENCOUNTER — Other Ambulatory Visit: Payer: Self-pay | Admitting: Obstetrics & Gynecology

## 2011-11-04 ENCOUNTER — Other Ambulatory Visit (HOSPITAL_COMMUNITY)
Admission: RE | Admit: 2011-11-04 | Discharge: 2011-11-04 | Disposition: A | Discharge status: Home / Self Care | Payer: BC Managed Care – PPO | Source: Ambulatory Visit | Attending: Obstetrics & Gynecology | Admitting: Obstetrics & Gynecology

## 2011-11-04 DIAGNOSIS — Z01419 Encounter for gynecological examination (general) (routine) without abnormal findings: Secondary | ICD-10-CM | POA: Insufficient documentation

## 2011-11-14 ENCOUNTER — Encounter
Payer: BC Managed Care – PPO | Attending: Physical Medicine & Rehabilitation | Admitting: Physical Medicine & Rehabilitation

## 2011-11-14 DIAGNOSIS — F17200 Nicotine dependence, unspecified, uncomplicated: Secondary | ICD-10-CM | POA: Insufficient documentation

## 2011-11-14 DIAGNOSIS — IMO0001 Reserved for inherently not codable concepts without codable children: Secondary | ICD-10-CM

## 2011-11-14 DIAGNOSIS — M7530 Calcific tendinitis of unspecified shoulder: Secondary | ICD-10-CM

## 2011-11-14 DIAGNOSIS — IMO0002 Reserved for concepts with insufficient information to code with codable children: Secondary | ICD-10-CM

## 2011-11-14 DIAGNOSIS — G8929 Other chronic pain: Secondary | ICD-10-CM | POA: Insufficient documentation

## 2011-11-14 DIAGNOSIS — M719 Bursopathy, unspecified: Secondary | ICD-10-CM | POA: Insufficient documentation

## 2011-11-14 DIAGNOSIS — M25519 Pain in unspecified shoulder: Secondary | ICD-10-CM | POA: Insufficient documentation

## 2011-11-14 DIAGNOSIS — F341 Dysthymic disorder: Secondary | ICD-10-CM | POA: Insufficient documentation

## 2011-11-14 DIAGNOSIS — M961 Postlaminectomy syndrome, not elsewhere classified: Secondary | ICD-10-CM | POA: Insufficient documentation

## 2011-11-14 DIAGNOSIS — M67919 Unspecified disorder of synovium and tendon, unspecified shoulder: Secondary | ICD-10-CM | POA: Insufficient documentation

## 2011-11-14 DIAGNOSIS — M5430 Sciatica, unspecified side: Secondary | ICD-10-CM

## 2011-11-14 NOTE — Assessment & Plan Note (Signed)
Melanie Avila is back regarding her fibromyalgia, generalized pain.  Her mother passed away this fall and has been quite a change for her and she has had a lot of anxiety.  She ran out of her Lyrica also which seems to coincide with increase in her anxiety.  Her pain is about 5/10. Described as constant and aching.  Pain is sharp, stabbing.  Neck seems to be bothering her more as well as her left shoulder.  She has been sleeping back in the bed with her husband who has been forcing her to lay on her side and chats quite a bit in bed.  She has now moved back to her own bed.  She is still taking 7 hydrocodone a day.  We have been working on adding a long-acting agent.  She is on Opana ER 20 q.12h.  REVIEW OF SYSTEMS:  Notable for spasms, tingling, depression, anxiety. Dr. Margo Aye changed her to Zoloft and she is taking Xanax 1.5 to 2 mg total per day for mood and she seems to be leveling out a bit there.  SOCIAL HISTORY:  The patient is married, living with her son and husband.  She is still smoking a pack of cigarettes per day.  PHYSICAL EXAMINATION:  VITAL SIGNS:  Blood pressure is 139/70, pulse is 85, respiratory rate 18, and she is saturating 99% on room air. GENERAL:  The patient is pleasant, alert, and oriented x3.  Affect is generally bright and appropriate.  She is very alert and seems to be grounded today behaviorally. MUSCULOSKELETAL:  She has some pain in the left shoulder with rotator cuff impingement maneuvers and pain at the subacromial bursa.  There is some mild tenderness with palpation over the neck but not nearly as tender as the shoulder appeared today.  Back is generally tender to palpation, but range of motion seemed to be preserved.  Strength is grossly 4-5/5 in all 4 limbs with pain inhibition in the left shoulder. Cognitively, she is intact.  ASSESSMENT: 1. Fibromyalgia syndrome. 2. Depression with anxiety. 3. Chronic pain/postlaminectomy syndrome. 4. Left shoulder  rotator cuff syndrome with referred pain.  PLAN: 1. We increased her Opana 30 mg q.12 hours and reduced her hydrocodone     down to 180.  We discussed at length regarding reduction of her     hydrocodone and the fact that she will need to work through this as     it will not be particularly easy.  She has been on that medicine     for so long and her body has become somewhat accustomed to that     schedule. 2. Recommended gentle range of motion.  Better sleeping posture     habits.  If shoulder does not improve, consider injection.  She may     try ice for now. 3. Discontinue baclofen, begin re-trial of Soma which apparently was     more helpful for her. 4. Consider trial of another anticonvulsant for pain and mood     stabilization. 5. Xanax and Zoloft for Dr. Catalina Pizza. 6. My nurse practitioner will see her back in a month.     Ranelle Oyster, M.D. Electronically Signed    ZTS/MedQ D:  11/14/2011 13:44:56  T:  11/14/2011 22:17:08  Job #:  [id number]  cc:   Catalina Pizza, M.D. Fax: [phone]

## 2011-12-12 ENCOUNTER — Encounter: Payer: BC Managed Care – PPO | Attending: Neurosurgery | Admitting: Neurosurgery

## 2011-12-12 DIAGNOSIS — IMO0001 Reserved for inherently not codable concepts without codable children: Secondary | ICD-10-CM | POA: Insufficient documentation

## 2011-12-12 DIAGNOSIS — F3289 Other specified depressive episodes: Secondary | ICD-10-CM | POA: Insufficient documentation

## 2011-12-12 DIAGNOSIS — F341 Dysthymic disorder: Secondary | ICD-10-CM

## 2011-12-12 DIAGNOSIS — M62838 Other muscle spasm: Secondary | ICD-10-CM | POA: Insufficient documentation

## 2011-12-12 DIAGNOSIS — F329 Major depressive disorder, single episode, unspecified: Secondary | ICD-10-CM | POA: Insufficient documentation

## 2011-12-12 DIAGNOSIS — G8929 Other chronic pain: Secondary | ICD-10-CM | POA: Insufficient documentation

## 2011-12-12 DIAGNOSIS — M961 Postlaminectomy syndrome, not elsewhere classified: Secondary | ICD-10-CM

## 2011-12-12 DIAGNOSIS — R209 Unspecified disturbances of skin sensation: Secondary | ICD-10-CM | POA: Insufficient documentation

## 2011-12-12 DIAGNOSIS — R5381 Other malaise: Secondary | ICD-10-CM | POA: Insufficient documentation

## 2011-12-12 DIAGNOSIS — F411 Generalized anxiety disorder: Secondary | ICD-10-CM | POA: Insufficient documentation

## 2011-12-12 DIAGNOSIS — R5383 Other fatigue: Secondary | ICD-10-CM | POA: Insufficient documentation

## 2011-12-13 NOTE — Assessment & Plan Note (Signed)
This is patient of Dr. Riley Kill seen for fibromyalgia and post laminectomy syndrome, lumbar spine.  She rates her average pain is 5-6.  It is a dull, tingling aching type pain.  General activity level is 4-5.  Pain is worse in the morning.  Sleep patterns are fair.  Pain is worse sitting and activity; rest, heat, and medication tend to help.  She walks without assistance.  She can climb steps.  She drives.  She is still employed full time, needs some help with household duties.  REVIEW OF SYSTEMS:  Notable for difficulties as described above as well as some weakness, tingling, spasms, depression, anxiety, no suicidal thoughts or aberrant behaviors.  Last pill count UDS consistent.  Past medical history, social history, family history unchanged.  PHYSICAL EXAMINATION:  VITAL SIGNS:  Blood pressure 132/77, pulse 75, respirations 16, O2 sats 97 on room air. MUSCULOSKELETAL:  Motor strength and sensation intact. CONSTITUTIONALLY:  Within normal limits.  She is alert and oriented x3.  ASSESSMENT: 1. Fibromyalgia. 2. Depression anxiety. 3. Chronic pain post laminectomy syndrome.  PLAN:  Refill Opana 30 mg ER 1 p.o. q.12 hours 60 with no refills. Questions were encouraged and answered.  Dr. Riley Kill will see her in a month.     Shilynn Hoch L. Blima Dessert Electronically Signed    RLW/MedQ D:  12/12/2011 13:20:41  T:  12/13/2011 16:10:96  Job #:  [id number]

## 2012-01-05 ENCOUNTER — Encounter: Payer: Self-pay | Admitting: *Deleted

## 2012-01-10 ENCOUNTER — Encounter: Payer: BC Managed Care – PPO | Admitting: Physical Medicine & Rehabilitation

## 2012-01-30 ENCOUNTER — Encounter: Payer: Self-pay | Admitting: Physical Medicine & Rehabilitation

## 2012-01-30 ENCOUNTER — Encounter
Payer: BC Managed Care – PPO | Attending: Physical Medicine & Rehabilitation | Admitting: Physical Medicine & Rehabilitation

## 2012-01-30 DIAGNOSIS — IMO0001 Reserved for inherently not codable concepts without codable children: Secondary | ICD-10-CM

## 2012-01-30 DIAGNOSIS — IMO0002 Reserved for concepts with insufficient information to code with codable children: Secondary | ICD-10-CM | POA: Insufficient documentation

## 2012-01-30 DIAGNOSIS — M961 Postlaminectomy syndrome, not elsewhere classified: Secondary | ICD-10-CM

## 2012-01-30 DIAGNOSIS — F418 Other specified anxiety disorders: Secondary | ICD-10-CM | POA: Insufficient documentation

## 2012-01-30 DIAGNOSIS — M797 Fibromyalgia: Secondary | ICD-10-CM | POA: Insufficient documentation

## 2012-01-30 DIAGNOSIS — F341 Dysthymic disorder: Secondary | ICD-10-CM | POA: Insufficient documentation

## 2012-01-30 MED ORDER — OXYMORPHONE HCL ER 30 MG PO TB12: 30.0000 mg | ORAL_TABLET | Freq: Two times a day (BID) | ORAL | Status: AC

## 2012-01-30 MED ORDER — HYDROCODONE-ACETAMINOPHEN 7.5-325 MG PO TABS: 1.0000 | ORAL_TABLET | ORAL | Status: AC | PRN

## 2012-01-30 MED ORDER — GABAPENTIN 300 MG PO CAPS: 300.0000 mg | ORAL_CAPSULE | Freq: Three times a day (TID) | ORAL | Status: DC

## 2012-01-30 NOTE — Patient Instructions (Signed)
General aerobic exercise is recommended

## 2012-01-30 NOTE — Progress Notes (Signed)
  Subjective:    Patient ID: Melanie Avila, female    DOB: [birth year], 51 y.o.   MRN: [id number] Pain Inventory Average Pain 6 Pain Right Now 6 My pain is constant, stabbing, tingling and aching  In the last 24 hours, has pain interfered with the following? General activity 5 Relation with others 4 What TIME of day is your pain at its worst? morning Sleep (in general) Fair  Pain is worse with: sitting, standing and some activites Pain improves with: rest, therapy/exercise and medication Relief from Meds: 4  Mobility walk without assistance how many minutes can you walk? walks 12hr shift at work ability to climb steps?  yes do you drive?  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  no  Physicians involved in your care Primary care Jeanett Schlein     HPI  [place] a couple weeks ago walking dog.  Both ankles bother her now and are sore with weight bearing.  Shoulders, neck, back hurt also.  Takes meds because she has to, to keep working at the pace she is. Situation a little less stress due to the care needs being less (mom's death)_ but still quite depressed about mom's passing  Review of Systems  HENT: Negative.   Respiratory: Negative.   Cardiovascular: Negative.   Gastrointestinal: Negative.  Negative for abdominal distention.  Genitourinary: Negative.   Musculoskeletal: Positive for myalgias, back pain, joint swelling, arthralgias and gait problem.  Skin: Negative.   Neurological: Negative.   Hematological: Negative.   Psychiatric/Behavioral: The patient is nervous/anxious.        Objective:   Physical Exam  Constitutional: She is oriented to person, place, and time. She appears well-developed.  HENT:  Head: Normocephalic.  Eyes: EOM are normal.  Pupils are equal, round, and reactive to light.  Neck: Normal range of motion.  Cardiovascular: Normal rate and regular rhythm.   Pulmonary/Chest: Effort normal.  Abdominal: Soft.  Musculoskeletal: Normal range of motion.       Some generalized tenderness around both ankles left greater than right.  No swelling or warmth.  Gait was normal.  No obvious ligament instability. Multiple tenderpoints throughtout limbs and trunks.  Generalized lumbar and cerivcal tenderness with ROM, and palpation.  Neurological: She is oriented to person, place, and time. She has normal strength and normal reflexes. No cranial nerve deficit or sensory deficit.  Skin: Skin is warm.          Assessment & Plan:  1. FMS  -discussed weaning narcotics.  I'd like to start by decreasing norco  -decrease norco to 7.5 #180  -discussed importance of aerobic exercise and mental health  -neurontin trial titrating up to 300mg  tid  -cessation of lyrica might have exacerbated pain 2.Lumbar post lami syndrome with radic  -exercise  -opana 30mg  #60 and norco  -again mental health stressed 3.Depression and anxiety  -pt still is depressed about her mother's passing which is to be expected   -discussed the importance of her mood as it reflects on pain 4. R/u with RN in 2 months. She can pick up meds next month.

## 2012-02-13 ENCOUNTER — Telehealth: Payer: Self-pay | Admitting: Physical Medicine & Rehabilitation

## 2012-02-13 NOTE — Telephone Encounter (Signed)
Worked Wed, Insurance claims handler. Shin hurt like shin splint  And by Saturday hurting really bad, along with back. Noticed redness @ shin and so sore could not pick up foot on Saturday . Some better now, and she is going to work, but it is still sore without redness. Starts at "bend of foot and ends at shin. Redness gone. Started after 2nd of gabapentin. Not taking it any longer. Any thoughts?

## 2012-02-13 NOTE — Telephone Encounter (Signed)
Ice to shin TID until resolution.  Needs to stretch area as possible.  Wear shock absorbing shoes and rest legs as able.

## 2012-02-13 NOTE — Telephone Encounter (Signed)
Took last Gabapentin Friday.  Hurts much worse, turns red at shin.  Not taking any more. Hard to walk.  Please call.

## 2012-02-14 ENCOUNTER — Other Ambulatory Visit (HOSPITAL_COMMUNITY): Payer: Self-pay | Admitting: *Deleted

## 2012-02-14 ENCOUNTER — Ambulatory Visit (HOSPITAL_COMMUNITY)
Admission: RE | Admit: 2012-02-14 | Discharge: 2012-02-14 | Disposition: A | Discharge status: Home / Self Care | Payer: BC Managed Care – PPO | Source: Ambulatory Visit | Attending: *Deleted | Admitting: *Deleted

## 2012-02-14 DIAGNOSIS — M79606 Pain in leg, unspecified: Secondary | ICD-10-CM

## 2012-02-14 DIAGNOSIS — M79609 Pain in unspecified limb: Secondary | ICD-10-CM | POA: Insufficient documentation

## 2012-02-14 DIAGNOSIS — M7989 Other specified soft tissue disorders: Secondary | ICD-10-CM | POA: Insufficient documentation

## 2012-02-14 NOTE — Telephone Encounter (Signed)
Relayed information to [place]. She says she has been and will continue doing  This.

## 2012-02-27 ENCOUNTER — Telehealth: Payer: Self-pay | Admitting: Physical Medicine & Rehabilitation

## 2012-02-27 MED ORDER — HYDROCODONE-ACETAMINOPHEN 10-325 MG PO TABS: 1.0000 | ORAL_TABLET | ORAL | Status: DC | PRN

## 2012-02-27 MED ORDER — OXYMORPHONE HCL ER 20 MG PO TB12: 20.0000 mg | ORAL_TABLET | Freq: Two times a day (BID) | ORAL | Status: DC

## 2012-02-27 NOTE — Telephone Encounter (Signed)
Refill on Opana and Hydrocodone

## 2012-02-27 NOTE — Telephone Encounter (Signed)
LM with that we will have these rx ready for pick up today.

## 2012-03-23 ENCOUNTER — Other Ambulatory Visit: Payer: Self-pay | Admitting: Obstetrics & Gynecology

## 2012-03-27 ENCOUNTER — Other Ambulatory Visit: Payer: Self-pay | Admitting: *Deleted

## 2012-03-27 MED ORDER — HYDROCODONE-ACETAMINOPHEN 7.5-325 MG PO TABS: 1.0000 | ORAL_TABLET | ORAL | Status: DC | PRN

## 2012-03-27 NOTE — Telephone Encounter (Signed)
Hydrocodone refilled, dose 7.5/325 q 4 hr prn # 180 per last MD visit 01/30/12 order per Dr Riley Kill.

## 2012-04-02 ENCOUNTER — Encounter
Payer: BC Managed Care – PPO | Attending: Physical Medicine & Rehabilitation | Admitting: Physical Medicine & Rehabilitation

## 2012-04-02 ENCOUNTER — Encounter: Payer: Self-pay | Admitting: Physical Medicine & Rehabilitation

## 2012-04-02 VITALS — BP 191/84 | HR 79 | Resp 16 | Ht 66.0 in | Wt 185.2 lb

## 2012-04-02 DIAGNOSIS — M961 Postlaminectomy syndrome, not elsewhere classified: Secondary | ICD-10-CM | POA: Insufficient documentation

## 2012-04-02 DIAGNOSIS — IMO0002 Reserved for concepts with insufficient information to code with codable children: Secondary | ICD-10-CM | POA: Insufficient documentation

## 2012-04-02 DIAGNOSIS — IMO0001 Reserved for inherently not codable concepts without codable children: Secondary | ICD-10-CM

## 2012-04-02 DIAGNOSIS — M797 Fibromyalgia: Secondary | ICD-10-CM

## 2012-04-02 DIAGNOSIS — F341 Dysthymic disorder: Secondary | ICD-10-CM

## 2012-04-02 DIAGNOSIS — F418 Other specified anxiety disorders: Secondary | ICD-10-CM

## 2012-04-02 MED ORDER — OXYMORPHONE HCL ER 30 MG PO TB12: 30.0000 mg | ORAL_TABLET | Freq: Two times a day (BID) | ORAL | Status: DC

## 2012-04-02 NOTE — Patient Instructions (Signed)
Continue to stretch and pace your self at work.  GET ADEQUATE SLEEP!

## 2012-04-02 NOTE — Progress Notes (Signed)
Subjective:    Patient ID: Melanie Avila, female    DOB: 1961-09-05, 51 y.o.   MRN: [id number]  HPI  Melanie Avila is back regarding her chronic pain.  We adjusted her norco down and her opana up a bit at last visit.  She feels that the combination has been working fairly well. She bought new tennis shoes, and they have made a big difference in the pain in her foot and low back pain. She takes time to stretch at work, and she builds in rest breaks as she's able to. The back seems to be her major problem still although she has generalized pain throughout. Fatigue remains an issue, but she continues to work regular hours at work while keeping the house going when she's home.  Things seem to have settled down from a psychosocial standpoint at home. She does have an ailing dog, which is causing emotional and financial stress.  Pain Inventory Average Pain 5 Pain Right Now 4 My pain is constant, stabbing, tingling and aching  In the last 24 hours, has pain interfered with the following? General activity 4 Relation with others 2 Enjoyment of life 2 What TIME of day is your pain at its worst? morning Sleep (in general) Fair  Pain is worse with: some activites Pain improves with: rest and medication Relief from Meds: 6  Mobility walk without assistance  Function employed # of hrs/week 43  Neuro/Psych weakness depression anxiety  Prior Studies Any changes since last visit?  no  Physicians involved in your care Any changes since last visit?  no      Review of Systems  Musculoskeletal: Positive for myalgias.  Neurological: Positive for weakness.  Psychiatric/Behavioral: Positive for dysphoric mood. The patient is nervous/anxious.   All other systems reviewed and are negative.       Objective:   Physical Exam  Constitutional: She is oriented to person, place, and time. She appears well-developed and well-nourished.  HENT:  Head: Normocephalic and atraumatic.  Eyes:  Conjunctivae and EOM are normal. Pupils are equal, round, and reactive to light.  Neck: Normal range of motion.  Cardiovascular: Normal rate and regular rhythm.   Pulmonary/Chest: Effort normal and breath sounds normal.  Abdominal: Soft. Bowel sounds are normal.  Musculoskeletal:       Mild to moderated tenderness with low back palpation. She could bend to 100 degrees with some discomfort. No gross muscle spasms. Hamstrings are a little tight. Gait is normal. Feet are nicely cradled in her shoes. No gross tenderness or abnormalities in foot position while wearing shoes.  Neurological: She is alert and oriented to person, place, and time. She has normal strength. No cranial nerve deficit or sensory deficit.  Skin: Skin is warm.  Psychiatric: She has a normal mood and affect. Her behavior is normal. Judgment and thought content normal.          Assessment & Plan:   1. FMS  -discussed weaning narcotics. I'd like to start by decreasing norco  -decrease norco 7.5 to #150 with next fill -discussed importance of aerobic exercise and mental health  -neurontin was stopped last month due to blotching and swelling. -continue stretching, appropriate posture and shoe wear. 2.Lumbar post lami syndrome with radic  -exercise as above -opana 30mg  #60  Was refilled  3.Depression and anxiety  -she's doing fairly well in this area right now. -continued to stress the importance of her mood as it reflects on pain  4. F/u with PA in 2 months.  She can pick up meds next month.

## 2012-04-10 ENCOUNTER — Other Ambulatory Visit: Payer: Self-pay | Admitting: *Deleted

## 2012-04-10 MED ORDER — CARISOPRODOL 350 MG PO TABS: 350.0000 mg | ORAL_TABLET | Freq: Three times a day (TID) | ORAL | Status: DC | PRN

## 2012-04-25 ENCOUNTER — Other Ambulatory Visit: Payer: Self-pay | Admitting: *Deleted

## 2012-04-25 MED ORDER — HYDROCODONE-ACETAMINOPHEN 7.5-325 MG PO TABS: 1.0000 | ORAL_TABLET | ORAL | Status: DC | PRN

## 2012-05-09 ENCOUNTER — Other Ambulatory Visit: Payer: Self-pay

## 2012-05-09 MED ORDER — CARISOPRODOL 350 MG PO TABS: 350.0000 mg | ORAL_TABLET | Freq: Three times a day (TID) | ORAL | Status: DC | PRN

## 2012-05-11 ENCOUNTER — Ambulatory Visit (HOSPITAL_COMMUNITY)
Admission: RE | Admit: 2012-05-11 | Discharge: 2012-05-11 | Disposition: A | Discharge status: Home / Self Care | Payer: BC Managed Care – PPO | Source: Ambulatory Visit | Attending: Internal Medicine | Admitting: Internal Medicine

## 2012-05-11 ENCOUNTER — Other Ambulatory Visit (HOSPITAL_COMMUNITY): Payer: Self-pay | Admitting: Internal Medicine

## 2012-05-11 DIAGNOSIS — M25529 Pain in unspecified elbow: Secondary | ICD-10-CM | POA: Insufficient documentation

## 2012-05-11 DIAGNOSIS — M25522 Pain in left elbow: Secondary | ICD-10-CM

## 2012-05-23 ENCOUNTER — Other Ambulatory Visit: Payer: Self-pay | Admitting: *Deleted

## 2012-05-23 MED ORDER — HYDROCODONE-ACETAMINOPHEN 7.5-325 MG PO TABS: 1.0000 | ORAL_TABLET | ORAL | Status: DC | PRN

## 2012-06-01 ENCOUNTER — Encounter
Payer: BC Managed Care – PPO | Attending: Physical Medicine & Rehabilitation | Admitting: Physical Medicine and Rehabilitation

## 2012-06-01 ENCOUNTER — Encounter: Payer: Self-pay | Admitting: Physical Medicine and Rehabilitation

## 2012-06-01 VITALS — BP 162/87 | HR 84 | Resp 14 | Ht 65.0 in | Wt 186.0 lb

## 2012-06-01 DIAGNOSIS — M797 Fibromyalgia: Secondary | ICD-10-CM

## 2012-06-01 DIAGNOSIS — IMO0002 Reserved for concepts with insufficient information to code with codable children: Secondary | ICD-10-CM

## 2012-06-01 DIAGNOSIS — G8929 Other chronic pain: Secondary | ICD-10-CM | POA: Insufficient documentation

## 2012-06-01 DIAGNOSIS — M545 Low back pain, unspecified: Secondary | ICD-10-CM | POA: Insufficient documentation

## 2012-06-01 DIAGNOSIS — R5381 Other malaise: Secondary | ICD-10-CM

## 2012-06-01 DIAGNOSIS — IMO0001 Reserved for inherently not codable concepts without codable children: Secondary | ICD-10-CM

## 2012-06-01 DIAGNOSIS — R5383 Other fatigue: Secondary | ICD-10-CM

## 2012-06-01 DIAGNOSIS — R0609 Other forms of dyspnea: Secondary | ICD-10-CM | POA: Insufficient documentation

## 2012-06-01 DIAGNOSIS — M961 Postlaminectomy syndrome, not elsewhere classified: Secondary | ICD-10-CM | POA: Insufficient documentation

## 2012-06-01 DIAGNOSIS — M25519 Pain in unspecified shoulder: Secondary | ICD-10-CM | POA: Insufficient documentation

## 2012-06-01 DIAGNOSIS — F341 Dysthymic disorder: Secondary | ICD-10-CM | POA: Insufficient documentation

## 2012-06-01 DIAGNOSIS — R0989 Other specified symptoms and signs involving the circulatory and respiratory systems: Secondary | ICD-10-CM | POA: Insufficient documentation

## 2012-06-01 DIAGNOSIS — M542 Cervicalgia: Secondary | ICD-10-CM | POA: Insufficient documentation

## 2012-06-01 MED ORDER — OXYMORPHONE HCL ER 30 MG PO TB12: 30.0000 mg | ORAL_TABLET | Freq: Two times a day (BID) | ORAL | Status: DC

## 2012-06-01 NOTE — Progress Notes (Signed)
Subjective:    Patient ID: Melanie Avila, female    DOB: 1961/01/23, 51 y.o.   MRN: [id number]  HPI The patient complains about chronic low back pain. pain which radiates into.  The patient also complains about diffuse pain around her left shoulder, shoulder blade and left neck. This started 3 weeks ago when she has on her back, kneeling position. The patient states that she has full functioning of the left shoulder, she just complains about pain.The patient also complains about being very tired during the day, and not feeling refreshed after she slept. She also reports that she snores very loud and that this has gotten worse, and that she would like to do a sleep study.   Pain Inventory Average Pain 4 Pain Right Now 6 My pain is intermittent, stabbing and aching  In the last 24 hours, has pain interfered with the following? General activity 5 Relation with others 4 Enjoyment of life 5 What TIME of day is your pain at its worst? morning Sleep (in general) Fair  Pain is worse with: some activites Pain improves with: rest, heat/ice and medication Relief from Meds: 6  Mobility walk without assistance ability to climb steps?  yes do you drive?  yes Do you have any goals in this area?  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  no  Neuro/Psych weakness spasms depression anxiety  Prior Studies x-rays  Physicians involved in your care Any changes since last visit?  no   Family History  Problem Relation Age of Onset  . Alzheimer's disease Mother   . Cancer Father   . Leukemia Father    History   Social History  . Marital Status: Married    Spouse Name: N/A    Number of Children: N/A  . Years of Education: N/A   Social History Main Topics  . Smoking status: Current Everyday Smoker -- 1.0 packs/day for 35 years    Types: Cigarettes  . Smokeless  tobacco: None  . Alcohol Use: No  . Drug Use: None  . Sexually Active: None   Other Topics Concern  . None   Social History Narrative  . None   Past Surgical History  Procedure Date  . Back surgery   . Carpal tunnel release   . Shoulder surgery   . Tubal ligation    Past Medical History  Diagnosis Date  . Fibromyalgia   . Anemia   . Anxiety   . GERD (gastroesophageal reflux disease)   . Hyperlipidemia   . Hypertension    BP 162/87  Pulse 84  Resp 14  Ht 5\' 5"  (1.651 m)  Wt 186 lb (84.369 kg)  BMI 30.95 kg/m2  SpO2 98%     Review of Systems  Musculoskeletal: Positive for back pain and arthralgias.  Neurological: Positive for weakness.  Psychiatric/Behavioral: Positive for dysphoric mood. The patient is nervous/anxious.   All other systems reviewed and are negative.       Objective:   Physical Exam  Constitutional: She is oriented to person, place, and time. She appears well-developed and well-nourished.  HENT:  Head: Normocephalic.  Neck: Neck supple.  Musculoskeletal: She exhibits tenderness.  Neurological: She is alert and oriented to person, place, and time.  Skin: Skin is warm and dry.  Psychiatric: She has a normal mood and affect.   Symmetric normal motor tone is  noted throughout. Normal muscle bulk. Muscle testing reveals 5/5 muscle strength of the upper extremity, and 5/5 of the lower extremity. Full range of motion in upper and lower extremities. ROM of spine is  restricted. Fine motor movements are normal in both hands.  DTR in the upper and lower extremity are present and symmetric 2+. No clonus is noted.  Patient arises from chair without difficulty. Narrow based gait with normal arm swing bilateral. Extreme tenderness on palpation around her left shoulder, shoulder blade and neck, but no restriction in movement or strength.      Assessment & Plan:   1. FMS  -discussed weaning narcotics. The patient has increased pain after a fall 3  weeks ago and  states that at this time she does not think that she is able to wean down on her pain medication. - consider decreasing norco 7.5 to #150 with next fill  -discussed importance of aerobic exercise and mental health  -neurontin was stopped last month due to blotching and swelling.  -continue stretching, appropriate posture and shoe wear.  2.Lumbar post lami syndrome with radic  -exercise as above  -opana 30mg  #60 Was refilled  3.Depression and anxiety  -she's doing fairly well in this area right now.  -continued to stress the importance of her mood as it reflects on pain  4. The patient states that she snores very loud and that witnesses have told her that she stops her breathing intermittently at night. She also reports that she dozes off during the day several times, and  that she does not feel refreshed in the morning. I ordered the sleep study, to be performed by a neurologist. 5. Left shoulder pain after a fall, 3 weeks ago. Exam of the shoulder was without significant findings, might consider an injection at the next visit if pain persists or worsens . Did not do an injection today because she just went through a Dosepak of prednisone. 4. F/u with PA in 1 months.

## 2012-06-01 NOTE — Patient Instructions (Signed)
Continue with medication, continue with walking at your work. Continue with stretching of shoulders.

## 2012-06-19 ENCOUNTER — Other Ambulatory Visit: Payer: Self-pay | Admitting: *Deleted

## 2012-06-19 MED ORDER — HYDROCODONE-ACETAMINOPHEN 7.5-325 MG PO TABS: 1.0000 | ORAL_TABLET | ORAL | Status: DC | PRN

## 2012-07-01 ENCOUNTER — Ambulatory Visit (HOSPITAL_BASED_OUTPATIENT_CLINIC_OR_DEPARTMENT_OTHER): Payer: BC Managed Care – PPO | Attending: Internal Medicine | Admitting: Radiology

## 2012-07-01 VITALS — Ht 65.0 in | Wt 181.0 lb

## 2012-07-01 DIAGNOSIS — G4733 Obstructive sleep apnea (adult) (pediatric): Secondary | ICD-10-CM

## 2012-07-02 ENCOUNTER — Encounter
Payer: BC Managed Care – PPO | Attending: Physical Medicine and Rehabilitation | Admitting: Physical Medicine and Rehabilitation

## 2012-07-02 ENCOUNTER — Ambulatory Visit: Payer: BC Managed Care – PPO | Admitting: Physical Medicine and Rehabilitation

## 2012-07-02 ENCOUNTER — Encounter: Payer: Self-pay | Admitting: Physical Medicine and Rehabilitation

## 2012-07-02 VITALS — BP 152/66 | HR 102 | Resp 16 | Ht 65.0 in | Wt 183.8 lb

## 2012-07-02 DIAGNOSIS — M25519 Pain in unspecified shoulder: Secondary | ICD-10-CM

## 2012-07-02 DIAGNOSIS — F329 Major depressive disorder, single episode, unspecified: Secondary | ICD-10-CM | POA: Insufficient documentation

## 2012-07-02 DIAGNOSIS — M25512 Pain in left shoulder: Secondary | ICD-10-CM

## 2012-07-02 DIAGNOSIS — IMO0001 Reserved for inherently not codable concepts without codable children: Secondary | ICD-10-CM

## 2012-07-02 DIAGNOSIS — M542 Cervicalgia: Secondary | ICD-10-CM

## 2012-07-02 DIAGNOSIS — K219 Gastro-esophageal reflux disease without esophagitis: Secondary | ICD-10-CM | POA: Insufficient documentation

## 2012-07-02 DIAGNOSIS — R209 Unspecified disturbances of skin sensation: Secondary | ICD-10-CM

## 2012-07-02 DIAGNOSIS — M797 Fibromyalgia: Secondary | ICD-10-CM

## 2012-07-02 DIAGNOSIS — E785 Hyperlipidemia, unspecified: Secondary | ICD-10-CM | POA: Insufficient documentation

## 2012-07-02 DIAGNOSIS — F17200 Nicotine dependence, unspecified, uncomplicated: Secondary | ICD-10-CM | POA: Insufficient documentation

## 2012-07-02 DIAGNOSIS — F3289 Other specified depressive episodes: Secondary | ICD-10-CM | POA: Insufficient documentation

## 2012-07-02 DIAGNOSIS — M545 Low back pain, unspecified: Secondary | ICD-10-CM | POA: Insufficient documentation

## 2012-07-02 DIAGNOSIS — R200 Anesthesia of skin: Secondary | ICD-10-CM

## 2012-07-02 DIAGNOSIS — M961 Postlaminectomy syndrome, not elsewhere classified: Secondary | ICD-10-CM

## 2012-07-02 DIAGNOSIS — F411 Generalized anxiety disorder: Secondary | ICD-10-CM | POA: Insufficient documentation

## 2012-07-02 DIAGNOSIS — IMO0002 Reserved for concepts with insufficient information to code with codable children: Secondary | ICD-10-CM

## 2012-07-02 DIAGNOSIS — I10 Essential (primary) hypertension: Secondary | ICD-10-CM | POA: Insufficient documentation

## 2012-07-02 MED ORDER — OXYMORPHONE HCL ER 30 MG PO TB12: 30.0000 mg | ORAL_TABLET | Freq: Two times a day (BID) | ORAL | Status: DC

## 2012-07-02 NOTE — Patient Instructions (Signed)
Do exercises for your shoulder and posture training for your neck as I showed.

## 2012-07-02 NOTE — Progress Notes (Signed)
Subjective:    Patient ID: Melanie Avila, female    DOB: 10/20/1961, 51 y.o.   MRN: [id number]  HPI The patient complains about chronic low back pain. The patient also complains about diffuse pain around her left shoulder, shoulder blade and left neck. This started 7 weeks ago when she fell on her back,from a kneeling position. The patient states that she has full functioning of the left shoulder, but she still complains about pain, and a weak feeling.The patient also complains about neck pain after the fall, which radiates into her left upper extremity, and she also complains about numbness in her left wrist. The problem has gotten worse. She also reports that she has had her sleep study done,  but she does not have the results yet.  Pain Inventory Average Pain 6 Pain Right Now 6 My pain is tingling and aching  In the last 24 hours, has pain interfered with the following? General activity 6 Relation with others 4 Enjoyment of life 6 What TIME of day is your pain at its worst? 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  no  Physicians involved in your care Any changes since last visit?  no   Family History  Problem Relation Age of Onset  . Alzheimer's disease Mother   . Cancer Father   . Leukemia Father    History   Social History  . Marital Status: Married    Spouse Name: N/A    Number of Children: N/A  . Years of Education: N/A   Social History Main Topics  . Smoking status: Current Everyday Smoker -- 1.0 packs/day for 35 years    Types: Cigarettes  . Smokeless tobacco: Never Used  . Alcohol Use: No  . Drug Use: None    . Sexually Active: None   Other Topics Concern  . None   Social History Narrative  . None   Past Surgical History  Procedure Date  . Back surgery   . Carpal tunnel release   . Shoulder surgery   . Tubal ligation    Past Medical History  Diagnosis Date  . Fibromyalgia   . Anemia   . Anxiety   . GERD (gastroesophageal reflux disease)   . Hyperlipidemia   . Hypertension    BP 152/66  Pulse 102  Resp 16  Ht 5\' 5"  (1.651 m)  Wt 183 lb 12.8 oz (83.371 kg)  BMI 30.59 kg/m2  SpO2 95%    Review of Systems  HENT: Positive for neck pain.   Musculoskeletal: Positive for back pain.       Shoulder pain  Psychiatric/Behavioral: Positive for disturbed wake/sleep cycle and dysphoric mood. The patient is nervous/anxious.   All other systems reviewed and are negative.       Objective:   Physical Exam Constitutional: She is oriented to person, place, and time. She appears well-developed and well-nourished.  HENT:  Head: Normocephalic.  Neck: Neck supple.  Musculoskeletal: She exhibits tenderness.  Neurological: She is alert and oriented to person, place, and time.  Skin: Skin is warm and dry.  Psychiatric: She has a normal mood and affect.   Symmetric normal motor tone is noted  throughout. Normal muscle bulk. Muscle testing reveals 5/5 muscle strength of the upper extremity, and 5/5 of the lower extremity. Full range of motion in upper and lower extremities. ROM of spine is restricted. Fine motor movements are normal in both hands.  DTR in the upper and lower extremity are present and symmetric 2+. No clonus is noted.  Patient arises from chair without difficulty. Narrow based gait with normal arm swing bilateral.  Extreme tenderness on palpation around her left shoulder, shoulder blade and neck, but no restriction in movement or strength.        Assessment & Plan:  1. FMS  -discussed weaning narcotics. The patient has increased pain after a fall 3 weeks ago and states  that at this time she does not think that she is able to wean down on her pain medication.  - consider decreasing norco 7.5 to #150 with next fill  -discussed importance of aerobic exercise and mental health  -neurontin was stopped last month due to blotching and swelling.  -continue stretching, appropriate posture and shoe wear.  2.Lumbar post lami syndrome with radic  -exercise as above  -opana 30mg  #60 Was refilled  3.Depression and anxiety  -she's doing fairly ok in this area right now.  -continued to stress the importance of her mood as it reflects on pain , suggested referral to counselor/psychiatrist, but patient denies 4. The patient states that she snores very loud and that witnesses have told her that she stops her breathing intermittently at night. She also reports that she dozes off during the day several times, and that she does not feel refreshed in the morning. She had a sleep study,  performed by a neurologist, yesterday, results pending.  5. Left shoulder pain after a fall, 7 weeks ago. Exam of the shoulder was without significant findings, but patient reports, that she had a rotator cuff tear on the right, and basically had the same minor findings, also pain has not gotten better in the last 7 weeks, ordered MRI of left shoulder, w/o contrast .  6. Left sided neck pain , and numbness in the left hand, ordered MRI, patient had a disc protrusion in her C-spine 8 years ago, now after the fall she has increased pain and radiation. Advised patient to do some stretching and strengthening exercises for her left shoulder, and her neck, also showed her some exercises to improve her posture, demonstrated those exercises and explained them in detail.  F/u with PA in 1 months.

## 2012-07-03 ENCOUNTER — Telehealth: Payer: Self-pay | Admitting: Physical Medicine and Rehabilitation

## 2012-07-03 NOTE — Addendum Note (Signed)
Addended by: Su Monks on: 07/03/2012 02:23 PM   Modules accepted: Orders

## 2012-07-03 NOTE — Telephone Encounter (Signed)
[facility] needs a STAT Creatinine for MRI.  Can you please order this?

## 2012-07-04 ENCOUNTER — Ambulatory Visit (HOSPITAL_COMMUNITY)
Admission: RE | Admit: 2012-07-04 | Discharge: 2012-07-04 | Disposition: A | Discharge status: Home / Self Care | Payer: BC Managed Care – PPO | Source: Ambulatory Visit | Attending: Physical Medicine and Rehabilitation | Admitting: Physical Medicine and Rehabilitation

## 2012-07-04 DIAGNOSIS — R200 Anesthesia of skin: Secondary | ICD-10-CM

## 2012-07-04 DIAGNOSIS — R209 Unspecified disturbances of skin sensation: Secondary | ICD-10-CM | POA: Insufficient documentation

## 2012-07-04 DIAGNOSIS — M5030 Other cervical disc degeneration, unspecified cervical region: Secondary | ICD-10-CM | POA: Insufficient documentation

## 2012-07-04 DIAGNOSIS — M542 Cervicalgia: Secondary | ICD-10-CM | POA: Insufficient documentation

## 2012-07-04 NOTE — Addendum Note (Signed)
Addended by: Su Monks on: 07/04/2012 10:27 AM   Modules accepted: Orders

## 2012-07-04 NOTE — Telephone Encounter (Signed)
Ordered serum creatinine.

## 2012-07-06 DIAGNOSIS — R0989 Other specified symptoms and signs involving the circulatory and respiratory systems: Secondary | ICD-10-CM

## 2012-07-06 DIAGNOSIS — R0609 Other forms of dyspnea: Secondary | ICD-10-CM

## 2012-07-06 DIAGNOSIS — G4733 Obstructive sleep apnea (adult) (pediatric): Secondary | ICD-10-CM

## 2012-07-07 NOTE — Procedures (Signed)
[name], Avila                 ACCOUNT NO.:  [id number]  MEDICAL RECORD NO.:  [id number]          PATIENT TYPE:  OUT  LOCATION:  SLEEP CENTER                 FACILITY:  [facility]  PHYSICIAN:  Disney Ruggiero D. Maple Hudson, MD, FCCP, FACPDATE OF BIRTH:  1961-05-27  DATE OF STUDY:  07/01/2012                           NOCTURNAL POLYSOMNOGRAM  REFERRING PHYSICIAN:  Catalina Pizza, M.D.  INDICATION FOR STUDY:  Hypersomnia with sleep apnea.  EPWORTH SLEEPINESS SCORE:  13/24.  BMI 30.1, weight 181 pounds, height 65 inches, neck 13.5 inches.  MEDICATIONS:  Home medications are charted and reviewed.  SLEEP ARCHITECTURE:  Total sleep time 394.5 minutes with sleep efficiency 91.2%.  Stage I was 6.1%, stage II 86.1%, stage III 0.9% REM, 7% of total sleep time.  Sleep latency 32.5 minutes, REM latency 351.5 minutes.  Awake after sleep onset 7 minutes.  Arousal index 40.5. Bedtime medication:  None.  RESPIRATORY DATA:  Apnea-hypopnea index (AHI) 5.9 per hour.  A total of 39 events was scored including 19 obstructive apneas, 2 central apneas, 18 hypopneas.  Events were not positional.  REM AHI 2.2 per hour.  There were insufficient numbers of early events to meet protocol requirements for initiation of split protocol, CPAP titration on this study night.  OXYGEN DATA:  Moderate to loud snoring with oxygen desaturation to a nadir of 89% and a mean oxygen saturation through the study of 94% on room air.  CARDIAC DATA:  Normal sinus rhythm.  MOVEMENT-PARASOMNIA:  A few incidental limb jerks were noted with little effect on sleep.  No bathroom trips.  IMPRESSIONS-RECOMMENDATIONS: 1. Sleep architecture was significant for most of the study night in     stage II sleep with little sustained waking.  The pattern suggests     medication effect, although bedtime medication was recorded as     "none." 2. Mild obstructive sleep apnea/hypopnea syndrome, AHI 5.9 per hour.     The normal range for adults is between 0  and 5 events per hour.     Moderate to loud snoring with oxygen desaturation to a nadir of 89%     and mean oxygen saturation through the study of 94% on room air. 3. Scores in this range are not usually addressed with CPAP.     Conservative measures including assessment of the upper airway for     rhinitis or obstruction leading to snoring may be helpful.     [name], MD, [facility], FACP Diplomate, American Board of Sleep Medicine    CDY/MEDQ  D:  07/07/2012 09:11:26  T:  07/07/2012 09:32:20  Job:  [id number]

## 2012-07-09 ENCOUNTER — Telehealth: Payer: Self-pay | Admitting: Physical Medicine & Rehabilitation

## 2012-07-09 NOTE — Telephone Encounter (Signed)
MRI results?  In pain.

## 2012-07-10 NOTE — Telephone Encounter (Signed)
Pt aware of MRI results 

## 2012-07-10 NOTE — Telephone Encounter (Signed)
Could you look at her MRI results? Thanks.

## 2012-07-10 NOTE — Telephone Encounter (Signed)
Patient does not have significant findings, MRI showed mild degenerative changes, similar to MRI from 2006

## 2012-07-16 ENCOUNTER — Telehealth: Payer: Self-pay | Admitting: *Deleted

## 2012-07-16 NOTE — Telephone Encounter (Signed)
Verbal order for Hydrocodone and Soma refills for 90 day supply.  Spoke to Diane and let her that we will not be able to give pt a 90 day supply of these medications.

## 2012-07-18 ENCOUNTER — Other Ambulatory Visit: Payer: Self-pay

## 2012-07-18 MED ORDER — HYDROCODONE-ACETAMINOPHEN 7.5-325 MG PO TABS: 1.0000 | ORAL_TABLET | ORAL | Status: DC | PRN

## 2012-08-01 ENCOUNTER — Encounter: Payer: Self-pay | Admitting: Physical Medicine and Rehabilitation

## 2012-08-01 ENCOUNTER — Encounter
Payer: BC Managed Care – PPO | Attending: Physical Medicine and Rehabilitation | Admitting: Physical Medicine and Rehabilitation

## 2012-08-01 VITALS — BP 134/62 | HR 86 | Resp 14 | Ht 65.0 in | Wt 185.0 lb

## 2012-08-01 DIAGNOSIS — IMO0002 Reserved for concepts with insufficient information to code with codable children: Secondary | ICD-10-CM

## 2012-08-01 DIAGNOSIS — F17200 Nicotine dependence, unspecified, uncomplicated: Secondary | ICD-10-CM | POA: Insufficient documentation

## 2012-08-01 DIAGNOSIS — K219 Gastro-esophageal reflux disease without esophagitis: Secondary | ICD-10-CM | POA: Insufficient documentation

## 2012-08-01 DIAGNOSIS — I10 Essential (primary) hypertension: Secondary | ICD-10-CM | POA: Insufficient documentation

## 2012-08-01 DIAGNOSIS — F418 Other specified anxiety disorders: Secondary | ICD-10-CM

## 2012-08-01 DIAGNOSIS — M545 Low back pain, unspecified: Secondary | ICD-10-CM | POA: Insufficient documentation

## 2012-08-01 DIAGNOSIS — IMO0001 Reserved for inherently not codable concepts without codable children: Secondary | ICD-10-CM | POA: Insufficient documentation

## 2012-08-01 DIAGNOSIS — M797 Fibromyalgia: Secondary | ICD-10-CM

## 2012-08-01 DIAGNOSIS — M25519 Pain in unspecified shoulder: Secondary | ICD-10-CM | POA: Insufficient documentation

## 2012-08-01 DIAGNOSIS — E785 Hyperlipidemia, unspecified: Secondary | ICD-10-CM | POA: Insufficient documentation

## 2012-08-01 DIAGNOSIS — M961 Postlaminectomy syndrome, not elsewhere classified: Secondary | ICD-10-CM | POA: Insufficient documentation

## 2012-08-01 DIAGNOSIS — M542 Cervicalgia: Secondary | ICD-10-CM | POA: Insufficient documentation

## 2012-08-01 DIAGNOSIS — F3289 Other specified depressive episodes: Secondary | ICD-10-CM | POA: Insufficient documentation

## 2012-08-01 DIAGNOSIS — F329 Major depressive disorder, single episode, unspecified: Secondary | ICD-10-CM | POA: Insufficient documentation

## 2012-08-01 DIAGNOSIS — F341 Dysthymic disorder: Secondary | ICD-10-CM

## 2012-08-01 DIAGNOSIS — F411 Generalized anxiety disorder: Secondary | ICD-10-CM | POA: Insufficient documentation

## 2012-08-01 MED ORDER — OXYMORPHONE HCL ER 30 MG PO TB12: 30.0000 mg | ORAL_TABLET | Freq: Two times a day (BID) | ORAL | Status: DC

## 2012-08-01 NOTE — Patient Instructions (Signed)
Advised patient to think about a possible referral to a counselor , who could help with coping strategies for her pain.

## 2012-08-01 NOTE — Progress Notes (Signed)
Subjective:    Patient ID: Melanie Avila, female    DOB: 21-Apr-1961, 51 y.o.   MRN: [id number]  HPI The patient complains about chronic low back pain.  The problem has gotten worse, she feels very frustrated. She also reports that she has had her sleep study done, but she does not have the results yet.  Pain Inventory Average Pain 6 Pain Right Now 4 My pain is constant, stabbing, tingling and aching  In the last 24 hours, has pain interfered with the following? General activity 5 Relation with others 4 Enjoyment of life 7 What TIME of day is your pain at its worst? morning Sleep (in general) Fair  Pain is worse with: sitting and some activites Pain improves with: rest and medication Relief from Meds: 5  Mobility how many minutes can you walk? 2 ability to climb steps?  yes do you drive?  yes Do you have any goals in this area?  no  Function employed # of hrs/week 43-60 [employer] Do you have any goals in this area?  no  Neuro/Psych weakness numbness tingling spasms depression anxiety  Prior Studies Any changes since last visit?  no  Physicians involved in your care Any changes since last visit?  no   Family History  Problem Relation Age of Onset  . Alzheimer's disease Mother   . Cancer Father   . Leukemia Father    History   Social History  . Marital Status: Married    Spouse Name: N/A    Number of Children: N/A  . Years of Education: N/A   Social History Main Topics  . Smoking status: Current Everyday Smoker -- 1.0 packs/day for 35 years    Types: Cigarettes  . Smokeless tobacco: Never Used  . Alcohol Use: No  . Drug Use: None  . Sexually Active: None   Other Topics Concern  . None   Social History Narrative  . None   Past Surgical History  Procedure Date  . Back surgery   . Carpal tunnel release   . Shoulder surgery   . Tubal ligation    Past Medical History  Diagnosis Date  . Fibromyalgia   . Anemia   . Anxiety   . GERD  (gastroesophageal reflux disease)   . Hyperlipidemia   . Hypertension    BP 134/62  Pulse 86  Resp 14  Ht 5\' 5"  (1.651 m)  Wt 185 lb (83.915 kg)  BMI 30.79 kg/m2  SpO2 98%     Review of Systems  Musculoskeletal: Positive for myalgias, back pain and arthralgias.  Neurological: Positive for weakness and numbness.  Psychiatric/Behavioral: Positive for dysphoric mood. The patient is nervous/anxious.   All other systems reviewed and are negative.       Objective:   Physical Exam onstitutional: She is oriented to person, place, and time. She appears well-developed and well-nourished.  HENT:  Head: Normocephalic.  Neck: Neck supple.  Musculoskeletal: She exhibits tenderness.  Neurological: She is alert and oriented to person, place, and time.  Skin: Skin is warm and dry.  Psychiatric: She has a normal mood and affect.  Symmetric normal motor tone is noted throughout. Normal muscle bulk. Muscle testing reveals 5/5 muscle strength of the upper extremity, and 5/5 of the lower extremity. Full range of motion in upper and lower extremities. ROM of spine is restricted. Fine motor movements are normal in both hands.  DTR in the upper and lower extremity are present and symmetric 2+. No clonus is  noted.  Patient arises from chair without difficulty. Narrow based gait with normal arm swing bilateral.  Extreme tenderness on palpation around her left shoulder, shoulder blade and neck, but no restriction in movement or strength.        Assessment & Plan:  1. FMS  -discussed weaning narcotics.  - consider decreasing norco 7.5 to #150 with next fill  -discussed importance of aerobic exercise and mental health  -neurontin was stopped last month due to blotching and swelling.  -continue stretching, appropriate posture and shoe wear.  2.Lumbar post lami syndrome with radic  -exercise as above  -opana 30mg  #60 Was refilled  3.Depression and anxiety  -she's very frustrated today, because  of her situation -continued to stress the importance of her mood as it reflects on pain , suggested referral to counselor/psychiatrist, but patient denies  4. The patient states that she snores very loud and that witnesses have told her that she stops her breathing intermittently at night. She also reports that she dozes off during the day several times, and that she does not feel refreshed in the morning. She had a sleep study, performed by a neurologist, yesterday, the results showed , that she only has a very mild sleep apnea, which does not require a C-PAP .Marland Kitchen  5. Left shoulder pain after a fall, 7 weeks ago. Exam of the shoulder was without significant findings, but patient reports, that she had a rotator cuff tear on the right, and basically had the same minor findings, also pain has not gotten better in the last 7 weeks, ordered X-ray of left shoulder, which was normal .  6. Left sided neck pain , and numbness in the left hand, ordered MRI, patient had a disc protrusion in her C-spine 8 years ago, now after the fall she has increased pain and radiation, did not show significant findings.  Advised patient to do some stretching and strengthening exercises for her left shoulder, and her neck, also showed her some exercises to improve her posture, demonstrated those exercises and explained them in detail at the last visit, patient has not done them.Also advised patient to exercise in a pool, if possible, patient states, that she has no time to do this. Explained to the patient that a holistic treatment has shown to be the most successful for a pain syndrome, also advised patient to consider counseling or seeing a psychiatrist to help her with coping strategies. Patient will think about this.  F/u with PA in 1 months.

## 2012-08-14 ENCOUNTER — Other Ambulatory Visit: Payer: Self-pay

## 2012-08-14 MED ORDER — HYDROCODONE-ACETAMINOPHEN 7.5-325 MG PO TABS: 1.0000 | ORAL_TABLET | ORAL | Status: DC | PRN

## 2012-08-14 MED ORDER — CARISOPRODOL 350 MG PO TABS: 350.0000 mg | ORAL_TABLET | Freq: Three times a day (TID) | ORAL | Status: DC | PRN

## 2012-08-29 ENCOUNTER — Encounter: Payer: BC Managed Care – PPO | Admitting: Physical Medicine & Rehabilitation

## 2012-10-18 ENCOUNTER — Encounter (HOSPITAL_BASED_OUTPATIENT_CLINIC_OR_DEPARTMENT_OTHER): Payer: Self-pay | Admitting: *Deleted

## 2012-10-18 NOTE — Progress Notes (Signed)
Reviewed dictation from 2010 shoulder surgery-was noted she had some difficulty intubations-she had never had that is past Dr crews oked

## 2012-10-18 NOTE — Progress Notes (Signed)
To come in for [id number] Chronic pain-to bring all meds and overnight bag-had shoulder here 2010-was 2 hr after block before surg done-neck swollen-some diff intubating.

## 2012-10-19 ENCOUNTER — Other Ambulatory Visit: Payer: Self-pay | Admitting: Orthopedic Surgery

## 2012-10-19 ENCOUNTER — Encounter (HOSPITAL_BASED_OUTPATIENT_CLINIC_OR_DEPARTMENT_OTHER)
Admission: RE | Admit: 2012-10-19 | Discharge: 2012-10-19 | Disposition: A | Discharge status: Home / Self Care | Payer: BC Managed Care – PPO | Source: Ambulatory Visit | Attending: Orthopedic Surgery | Admitting: Orthopedic Surgery

## 2012-10-19 LAB — BASIC METABOLIC PANEL
BUN: 8 mg/dL (ref 6–23)
CO2: 29 mEq/L (ref 19–32)
Calcium: 9.8 mg/dL (ref 8.4–10.5)
GFR calc non Af Amer: >90 mL/min (ref >90)
Glucose, Bld: 117 mg/dL — ABNORMAL HIGH (ref 70–99)

## 2012-10-22 NOTE — H&P (Signed)
Melanie Avila is an 51 y.o. female.   Chief Complaint: c/o chronic and progressive pain left shoulder HPI: Melanie Avila was seen recently for a consult regarding her painful left shoulder and elbow. I have known Melanie Avila for years. We have performed a right shoulder arthroscopy in 2010. Melanie Avila has background Fibromyalgia. She sustained significant injuries to her shoulder and elbow walking her dog on 05-01-12. At that time she had a strain injury to the shoulder and indirect injury to the elbow. She had increasing pain in the shoulder, loss of ROM and difficulty sleeping at night. She has cervical degenerative disc disease. She has been noting pain with ROM of the shoulder, weakness of abduction, external rotation and scaption. She has impairment of her activities with daily living, sleep and impairment while driving.    Past Medical History  Diagnosis Date  . Fibromyalgia   . Anemia   . Anxiety   . GERD (gastroesophageal reflux disease)   . Hyperlipidemia   . Hypertension   . Depression   . DDD (degenerative disc disease)   . Complication of anesthesia     last shoulder surg 2010-dsc-2 hr after block to surg-neck swelled-hard to tube-stayed RCC    Past Surgical History  Procedure Date  . Carpal tunnel release   . Shoulder surgery   . Tubal ligation   . Back surgery 89,02    lumbar x2    Family History  Problem Relation Age of Onset  . Alzheimer's disease Mother   . Cancer Father   . Leukemia Father    Social History:  reports that she has been smoking Cigarettes.  She has a 35 pack-year smoking history. She has never used smokeless tobacco. She reports that she does not drink alcohol. Her drug history not on file.  Allergies:  Allergies  Allergen Reactions  . Cymbalta (Duloxetine Hcl)     Suicidal thoughts  . Sulfa Antibiotics Swelling  . Effexor (Venlafaxine Hydrochloride)     No prescriptions prior to admission    No results found for this or any previous visit (from  the past 48 hour(s)).  No results found.   Pertinent items are noted in HPI.  Height 5\' 5"  (1.651 m), weight 83.915 kg (185 lb).  General appearance: alert Head: Normocephalic, without obvious abnormality Neck: supple, symmetrical, trachea midline Resp: clear to auscultation bilaterally Cardio: regular rate and rhythm GI: normal findings: bowel sounds normal Extremities: Her shoulder ROM reveals combined elevation right shoulder 180, left shoulder 170, external rotation at 90 degrees abduction right shoulder 95, left shoulder 85, internal rotation right shoulder T9 and left shoulder T11. Her extension of the interscapular plane in the right shoulder is 5 degrees beyond the plane and left shoulder is 5 degrees anterior to the plane. She has a markedly painful push off test on the left.  MRI obtained on 10-03-12. Melanie Avila has very unfavorable AC anatomy with closure of the subacromial and sub-AC outlet and marked swelling of her tendon. She likely has a near full thickness bursal side rotator cuff tear of her anterior supraspinatus. We reviewed her images in detail. She has signs of very significant rotator cuff tendinopathy Pulses: 2+ and symmetric Skin: normal Neurologic: Grossly normal   Assessment/Plan Impression:Left shoulder RC tendinopathy with AC arthrosis.  Plan: To the OR for left SA with SAD/DCR and RC repair as needed.The procedure, risks,benefits and post-op course were discussed with the patient at length and they were in agreement with the plan.  DASNOIT,Mamie Diiorio J 10/22/2012, 3:56 PM     H&P documentation: 10/23/2012  -History and Physical Reviewed  -Patient has been re-examined  -No change in the plan of care  Wyn Forster, MD

## 2012-10-23 ENCOUNTER — Encounter (HOSPITAL_BASED_OUTPATIENT_CLINIC_OR_DEPARTMENT_OTHER)
Admission: RE | Disposition: A | Discharge status: Home / Self Care | Payer: Self-pay | Source: Ambulatory Visit | Attending: Orthopedic Surgery

## 2012-10-23 ENCOUNTER — Encounter (HOSPITAL_BASED_OUTPATIENT_CLINIC_OR_DEPARTMENT_OTHER): Payer: Self-pay | Admitting: Certified Registered Nurse Anesthetist

## 2012-10-23 ENCOUNTER — Ambulatory Visit (HOSPITAL_BASED_OUTPATIENT_CLINIC_OR_DEPARTMENT_OTHER)
Admission: RE | Admit: 2012-10-23 | Discharge: 2012-10-24 | Disposition: A | Discharge status: Home / Self Care | Payer: BC Managed Care – PPO | Source: Ambulatory Visit | Attending: Orthopedic Surgery | Admitting: Orthopedic Surgery

## 2012-10-23 ENCOUNTER — Ambulatory Visit (HOSPITAL_BASED_OUTPATIENT_CLINIC_OR_DEPARTMENT_OTHER): Payer: BC Managed Care – PPO | Admitting: Certified Registered Nurse Anesthetist

## 2012-10-23 ENCOUNTER — Encounter (HOSPITAL_BASED_OUTPATIENT_CLINIC_OR_DEPARTMENT_OTHER): Payer: Self-pay | Admitting: *Deleted

## 2012-10-23 DIAGNOSIS — I10 Essential (primary) hypertension: Secondary | ICD-10-CM | POA: Insufficient documentation

## 2012-10-23 DIAGNOSIS — IMO0002 Reserved for concepts with insufficient information to code with codable children: Secondary | ICD-10-CM

## 2012-10-23 DIAGNOSIS — M719 Bursopathy, unspecified: Secondary | ICD-10-CM | POA: Insufficient documentation

## 2012-10-23 DIAGNOSIS — M24119 Other articular cartilage disorders, unspecified shoulder: Secondary | ICD-10-CM | POA: Insufficient documentation

## 2012-10-23 DIAGNOSIS — Z01810 Encounter for preprocedural cardiovascular examination: Secondary | ICD-10-CM | POA: Insufficient documentation

## 2012-10-23 DIAGNOSIS — E785 Hyperlipidemia, unspecified: Secondary | ICD-10-CM | POA: Insufficient documentation

## 2012-10-23 DIAGNOSIS — Z5333 Arthroscopic surgical procedure converted to open procedure: Secondary | ICD-10-CM | POA: Insufficient documentation

## 2012-10-23 DIAGNOSIS — M67919 Unspecified disorder of synovium and tendon, unspecified shoulder: Secondary | ICD-10-CM | POA: Insufficient documentation

## 2012-10-23 DIAGNOSIS — Z01812 Encounter for preprocedural laboratory examination: Secondary | ICD-10-CM | POA: Insufficient documentation

## 2012-10-23 DIAGNOSIS — M961 Postlaminectomy syndrome, not elsewhere classified: Secondary | ICD-10-CM

## 2012-10-23 DIAGNOSIS — M19019 Primary osteoarthritis, unspecified shoulder: Secondary | ICD-10-CM | POA: Insufficient documentation

## 2012-10-23 DIAGNOSIS — Z79899 Other long term (current) drug therapy: Secondary | ICD-10-CM | POA: Insufficient documentation

## 2012-10-23 DIAGNOSIS — M797 Fibromyalgia: Secondary | ICD-10-CM

## 2012-10-23 HISTORY — DX: Major depressive disorder, single episode, unspecified: F32.9

## 2012-10-23 HISTORY — DX: Other complications of anesthesia, initial encounter: T88.59XA

## 2012-10-23 HISTORY — DX: Reserved for concepts with insufficient information to code with codable children: IMO0002

## 2012-10-23 HISTORY — PX: SHOULDER ARTHROSCOPY WITH SUBACROMIAL DECOMPRESSION: SHX5684

## 2012-10-23 HISTORY — DX: Depression, unspecified: F32.A

## 2012-10-23 HISTORY — DX: Adverse effect of unspecified anesthetic, initial encounter: T41.45XA

## 2012-10-23 SURGERY — SHOULDER ARTHROSCOPY WITH SUBACROMIAL DECOMPRESSION
Anesthesia: General | Site: Shoulder | Laterality: Left | Wound class: Clean

## 2012-10-23 MED ORDER — ALPRAZOLAM 1 MG PO TABS
1.0000 mg | ORAL_TABLET | Freq: Two times a day (BID) | ORAL | Status: DC | PRN
Start: 2012-10-23 — End: 2012-10-24

## 2012-10-23 MED ORDER — MIDAZOLAM HCL 5 MG/5ML IJ SOLN
INTRAMUSCULAR | Status: DC | PRN
  Administered 2012-10-23: 1 mg via INTRAVENOUS

## 2012-10-23 MED ORDER — CHLORHEXIDINE GLUCONATE 4 % EX LIQD: 60.0000 mL | Freq: Once | CUTANEOUS | Status: DC

## 2012-10-23 MED ORDER — HYDROMORPHONE HCL 2 MG PO TABS: ORAL_TABLET | ORAL | Status: DC

## 2012-10-23 MED ORDER — LACTATED RINGERS IV SOLN
INTRAVENOUS | Status: DC
  Administered 2012-10-23 (×2): via INTRAVENOUS

## 2012-10-23 MED ORDER — HYDROMORPHONE HCL PF 1 MG/ML IJ SOLN
0.2500 mg | INTRAMUSCULAR | Status: DC | PRN
  Administered 2012-10-23 (×2): 0.5 mg via INTRAVENOUS

## 2012-10-23 MED ORDER — OXYCODONE HCL 5 MG/5ML PO SOLN: 5.0000 mg | Freq: Once | ORAL | Status: AC | PRN

## 2012-10-23 MED ORDER — ONDANSETRON HCL 4 MG/2ML IJ SOLN: 4.0000 mg | Freq: Four times a day (QID) | INTRAMUSCULAR | Status: DC | PRN

## 2012-10-23 MED ORDER — METOCLOPRAMIDE HCL 5 MG PO TABS: 5.0000 mg | ORAL_TABLET | Freq: Three times a day (TID) | ORAL | Status: DC | PRN

## 2012-10-23 MED ORDER — IBUPROFEN 600 MG PO TABS: 600.0000 mg | ORAL_TABLET | Freq: Four times a day (QID) | ORAL | Status: DC | PRN

## 2012-10-23 MED ORDER — METHOCARBAMOL 100 MG/ML IJ SOLN
500.0000 mg | Freq: Four times a day (QID) | INTRAVENOUS | Status: DC | PRN
  Administered 2012-10-23: 500 mg via INTRAVENOUS

## 2012-10-23 MED ORDER — MEDROXYPROGESTERONE ACETATE 2.5 MG PO TABS: 2.5000 mg | ORAL_TABLET | Freq: Two times a day (BID) | ORAL | Status: DC

## 2012-10-23 MED ORDER — SODIUM CHLORIDE 0.9 % IV SOLN
INTRAVENOUS | Status: DC
  Administered 2012-10-23: 16:00:00 via INTRAVENOUS

## 2012-10-23 MED ORDER — METOCLOPRAMIDE HCL 5 MG/ML IJ SOLN: 5.0000 mg | Freq: Three times a day (TID) | INTRAMUSCULAR | Status: DC | PRN

## 2012-10-23 MED ORDER — LIDOCAINE HCL (CARDIAC) 20 MG/ML IV SOLN
INTRAVENOUS | Status: DC | PRN
  Administered 2012-10-23: 50 mg via INTRAVENOUS

## 2012-10-23 MED ORDER — EPHEDRINE SULFATE 50 MG/ML IJ SOLN
INTRAMUSCULAR | Status: DC | PRN
  Administered 2012-10-23 (×5): 10 mg via INTRAVENOUS

## 2012-10-23 MED ORDER — PROPOFOL 10 MG/ML IV BOLUS
INTRAVENOUS | Status: DC | PRN
  Administered 2012-10-23: 200 mg via INTRAVENOUS

## 2012-10-23 MED ORDER — MORPHINE SULFATE ER 60 MG PO TBCR: 60.0000 mg | EXTENDED_RELEASE_TABLET | Freq: Two times a day (BID) | ORAL | Status: DC

## 2012-10-23 MED ORDER — OXYCODONE-ACETAMINOPHEN 5-325 MG PO TABS
1.0000 | ORAL_TABLET | ORAL | Status: DC | PRN
  Administered 2012-10-23: 2 via ORAL

## 2012-10-23 MED ORDER — OLMESARTAN-AMLODIPINE-HCTZ 40-10-25 MG PO TABS: 1.0000 | ORAL_TABLET | Freq: Every day | ORAL | Status: DC

## 2012-10-23 MED ORDER — MEPERIDINE HCL 25 MG/ML IJ SOLN: 6.2500 mg | INTRAMUSCULAR | Status: DC | PRN

## 2012-10-23 MED ORDER — CEFAZOLIN SODIUM-DEXTROSE 2-3 GM-% IV SOLR
2.0000 g | Freq: Four times a day (QID) | INTRAVENOUS | Status: AC
  Administered 2012-10-23 – 2012-10-24 (×3): 2 g via INTRAVENOUS

## 2012-10-23 MED ORDER — CEPHALEXIN 500 MG PO CAPS: 500.0000 mg | ORAL_CAPSULE | Freq: Three times a day (TID) | ORAL | Status: DC

## 2012-10-23 MED ORDER — SODIUM CHLORIDE 0.9 % IR SOLN
Status: DC | PRN
  Administered 2012-10-23: 6

## 2012-10-23 MED ORDER — HYDROCODONE-ACETAMINOPHEN 7.5-325 MG PO TABS: 1.0000 | ORAL_TABLET | ORAL | Status: DC | PRN

## 2012-10-23 MED ORDER — ESTRADIOL 2 MG PO TABS: 2.0000 mg | ORAL_TABLET | Freq: Every day | ORAL | Status: DC

## 2012-10-23 MED ORDER — ONDANSETRON HCL 4 MG/2ML IJ SOLN: 4.0000 mg | Freq: Once | INTRAMUSCULAR | Status: AC | PRN

## 2012-10-23 MED ORDER — FENTANYL CITRATE 0.05 MG/ML IJ SOLN
50.0000 ug | INTRAMUSCULAR | Status: DC | PRN
  Administered 2012-10-23: 100 ug via INTRAVENOUS

## 2012-10-23 MED ORDER — FENTANYL CITRATE 0.05 MG/ML IJ SOLN
INTRAMUSCULAR | Status: DC | PRN
  Administered 2012-10-23: 50 ug via INTRAVENOUS

## 2012-10-23 MED ORDER — ROPIVACAINE HCL 5 MG/ML IJ SOLN
INTRAMUSCULAR | Status: DC | PRN
  Administered 2012-10-23: 30 mL via EPIDURAL

## 2012-10-23 MED ORDER — SIMVASTATIN 40 MG PO TABS: 40.0000 mg | ORAL_TABLET | Freq: Every evening | ORAL | Status: DC

## 2012-10-23 MED ORDER — CARISOPRODOL 350 MG PO TABS: 350.0000 mg | ORAL_TABLET | Freq: Three times a day (TID) | ORAL | Status: DC | PRN

## 2012-10-23 MED ORDER — MIDAZOLAM HCL 2 MG/2ML IJ SOLN
1.0000 mg | INTRAMUSCULAR | Status: DC | PRN
  Administered 2012-10-23: 2 mg via INTRAVENOUS

## 2012-10-23 MED ORDER — CEFAZOLIN SODIUM-DEXTROSE 2-3 GM-% IV SOLR
2.0000 g | Freq: Once | INTRAVENOUS | Status: AC
  Administered 2012-10-23: 2 g via INTRAVENOUS

## 2012-10-23 MED ORDER — ONDANSETRON HCL 4 MG PO TABS: 4.0000 mg | ORAL_TABLET | Freq: Four times a day (QID) | ORAL | Status: DC | PRN

## 2012-10-23 MED ORDER — OXYCODONE HCL 5 MG PO TABS: 5.0000 mg | ORAL_TABLET | Freq: Once | ORAL | Status: AC | PRN

## 2012-10-23 MED ORDER — DEXAMETHASONE SODIUM PHOSPHATE 4 MG/ML IJ SOLN
INTRAMUSCULAR | Status: DC | PRN
  Administered 2012-10-23: 10 mg via INTRAVENOUS

## 2012-10-23 MED ORDER — PANTOPRAZOLE SODIUM 40 MG PO TBEC: 40.0000 mg | DELAYED_RELEASE_TABLET | Freq: Every day | ORAL | Status: DC

## 2012-10-23 MED ORDER — ONDANSETRON HCL 4 MG/2ML IJ SOLN
INTRAMUSCULAR | Status: DC | PRN
  Administered 2012-10-23: 4 mg via INTRAVENOUS

## 2012-10-23 MED ORDER — HYDROMORPHONE HCL PF 1 MG/ML IJ SOLN: 0.5000 mg | INTRAMUSCULAR | Status: DC | PRN

## 2012-10-23 MED ORDER — SUCCINYLCHOLINE CHLORIDE 20 MG/ML IJ SOLN
INTRAMUSCULAR | Status: DC | PRN
  Administered 2012-10-23: 100 mg via INTRAVENOUS

## 2012-10-23 MED ORDER — METHOCARBAMOL 500 MG PO TABS: 500.0000 mg | ORAL_TABLET | Freq: Four times a day (QID) | ORAL | Status: DC | PRN

## 2012-10-23 SURGICAL SUPPLY — 81 items
ANCHOR SUT BIO SW 4.75 W/FIB (Anchor) ×2 IMPLANT
ANCHOR SUT BIO SW 4.75X19.1 (Anchor) ×4 IMPLANT
BANDAGE ADHESIVE 1X3 (GAUZE/BANDAGES/DRESSINGS) IMPLANT
BLADE AVERAGE 25X9 (BLADE) IMPLANT
BLADE CUTTER MENIS 5.5 (BLADE) IMPLANT
BLADE SURG 15 STRL LF DISP TIS (BLADE) ×3 IMPLANT
BLADE SURG 15 STRL SS (BLADE) ×3
BUR EGG 3PK/BX (BURR) IMPLANT
BUR OVAL 6.0 (BURR) ×2 IMPLANT
CANISTER OMNI JUG 16 LITER (MISCELLANEOUS) ×2 IMPLANT
CANISTER SUCTION 2500CC (MISCELLANEOUS) IMPLANT
CANNULA SHOULDER 7CM (CANNULA) IMPLANT
CANNULA TWIST IN 8.25X7CM (CANNULA) ×2 IMPLANT
CLEANER CAUTERY TIP 5X5 PAD (MISCELLANEOUS) IMPLANT
CLOTH BEACON ORANGE TIMEOUT ST (SAFETY) ×2 IMPLANT
CUTTER MENISCUS  4.2MM (BLADE) ×1
CUTTER MENISCUS 4.2MM (BLADE) ×1 IMPLANT
DECANTER SPIKE VIAL GLASS SM (MISCELLANEOUS) IMPLANT
DRAPE INCISE IOBAN 66X45 STRL (DRAPES) ×2 IMPLANT
DRAPE STERI 35X30 U-POUCH (DRAPES) ×2 IMPLANT
DRAPE SURG 17X23 STRL (DRAPES) ×2 IMPLANT
DRAPE U-SHAPE 47X51 STRL (DRAPES) ×2 IMPLANT
DRAPE U-SHAPE 76X120 STRL (DRAPES) ×4 IMPLANT
DRSG PAD ABDOMINAL 8X10 ST (GAUZE/BANDAGES/DRESSINGS) ×2 IMPLANT
DURAPREP 26ML APPLICATOR (WOUND CARE) ×2 IMPLANT
ELECT REM PT RETURN 9FT ADLT (ELECTROSURGICAL) ×2
ELECTRODE REM PT RTRN 9FT ADLT (ELECTROSURGICAL) ×1 IMPLANT
GLOVE BIO SURGEON STRL SZ 6.5 (GLOVE) ×2 IMPLANT
GLOVE BIOGEL M STRL SZ7.5 (GLOVE) ×2 IMPLANT
GLOVE BIOGEL PI IND STRL 7.0 (GLOVE) ×1 IMPLANT
GLOVE BIOGEL PI IND STRL 8 (GLOVE) ×2 IMPLANT
GLOVE BIOGEL PI INDICATOR 7.0 (GLOVE) ×1
GLOVE BIOGEL PI INDICATOR 8 (GLOVE) ×2
GLOVE EXAM NITRILE EXT CUFF MD (GLOVE) ×2 IMPLANT
GLOVE ORTHO TXT STRL SZ7.5 (GLOVE) ×2 IMPLANT
GOWN BRE IMP PREV XXLGXLNG (GOWN DISPOSABLE) ×4 IMPLANT
GOWN PREVENTION PLUS XLARGE (GOWN DISPOSABLE) ×2 IMPLANT
GOWN STRL REIN 2XL XLG LVL4 (GOWN DISPOSABLE) ×2 IMPLANT
NDL SUT 6 .5 CRC .975X.05 MAYO (NEEDLE) ×1 IMPLANT
NEEDLE MAYO TAPER (NEEDLE) ×1
NEEDLE MINI RC 24MM (NEEDLE) IMPLANT
NEEDLE SCORPION (NEEDLE) ×2 IMPLANT
PACK ARTHROSCOPY DSU (CUSTOM PROCEDURE TRAY) ×2 IMPLANT
PACK BASIN DAY SURGERY FS (CUSTOM PROCEDURE TRAY) ×2 IMPLANT
PAD CLEANER CAUTERY TIP 5X5 (MISCELLANEOUS)
PASSER SUT SWANSON 36MM LOOP (INSTRUMENTS) IMPLANT
PENCIL BUTTON HOLSTER BLD 10FT (ELECTRODE) ×2 IMPLANT
SLEEVE SCD COMPRESS KNEE MED (MISCELLANEOUS) ×2 IMPLANT
SLING ARM FOAM STRAP LRG (SOFTGOODS) ×2 IMPLANT
SLING ARM FOAM STRAP MED (SOFTGOODS) IMPLANT
SPONGE GAUZE 4X4 12PLY (GAUZE/BANDAGES/DRESSINGS) ×2 IMPLANT
SPONGE LAP 4X18 X RAY DECT (DISPOSABLE) ×2 IMPLANT
STRIP CLOSURE SKIN 1/2X4 (GAUZE/BANDAGES/DRESSINGS) ×2 IMPLANT
SUCTION FRAZIER TIP 10 FR DISP (SUCTIONS) IMPLANT
SUT ETHIBOND 2 OS 4 DA (SUTURE) IMPLANT
SUT ETHILON 4 0 PS 2 18 (SUTURE) IMPLANT
SUT FIBERWIRE #2 38 T-5 BLUE (SUTURE)
SUT FIBERWIRE 3-0 18 TAPR NDL (SUTURE)
SUT PROLENE 1 CT (SUTURE) IMPLANT
SUT PROLENE 3 0 PS 2 (SUTURE) ×2 IMPLANT
SUT TIGER TAPE 7 IN WHITE (SUTURE) IMPLANT
SUT VIC AB 0 CT1 27 (SUTURE)
SUT VIC AB 0 CT1 27XBRD ANBCTR (SUTURE) IMPLANT
SUT VIC AB 0 SH 27 (SUTURE) ×2 IMPLANT
SUT VIC AB 2-0 SH 27 (SUTURE) ×1
SUT VIC AB 2-0 SH 27XBRD (SUTURE) ×1 IMPLANT
SUT VIC AB 3-0 SH 27 (SUTURE)
SUT VIC AB 3-0 SH 27X BRD (SUTURE) IMPLANT
SUT VIC AB 3-0 X1 27 (SUTURE) IMPLANT
SUTURE FIBERWR #2 38 T-5 BLUE (SUTURE) IMPLANT
SUTURE FIBERWR 3-0 18 TAPR NDL (SUTURE) IMPLANT
SYR 3ML 23GX1 SAFETY (SYRINGE) IMPLANT
SYR BULB 3OZ (MISCELLANEOUS) IMPLANT
TAPE FIBER 2MM 7IN #2 BLUE (SUTURE) ×2 IMPLANT
TAPE PAPER 3X10 WHT MICROPORE (GAUZE/BANDAGES/DRESSINGS) ×2 IMPLANT
TOWEL OR 17X24 6PK STRL BLUE (TOWEL DISPOSABLE) ×2 IMPLANT
TUBE CONNECTING 20X1/4 (TUBING) ×4 IMPLANT
TUBING ARTHROSCOPY IRRIG 16FT (MISCELLANEOUS) ×2 IMPLANT
WAND STAR VAC 90 (SURGICAL WAND) ×2 IMPLANT
WATER STERILE IRR 1000ML POUR (IV SOLUTION) ×2 IMPLANT
YANKAUER SUCT BULB TIP NO VENT (SUCTIONS) IMPLANT

## 2012-10-23 NOTE — Anesthesia Postprocedure Evaluation (Signed)
Anesthesia Post Note  Patient: Melanie Avila  Procedure(s) Performed: Procedure(s) (LRB): SHOULDER ARTHROSCOPY WITH SUBACROMIAL DECOMPRESSION (Left)  Anesthesia type: general  Patient location: PACU  Post pain: Pain level controlled  Post assessment: Patient's Cardiovascular Status Stable  Last Vitals:  Filed Vitals:   10/23/12 1430  BP: 110/61  Pulse: 91  Temp:   Resp: 15    Post vital signs: Reviewed and stable  Level of consciousness: sedated  Complications: No apparent anesthesia complications

## 2012-10-23 NOTE — Brief Op Note (Signed)
10/23/2012  1:23 PM  PATIENT:  Melanie Avila  51 y.o. female  PRE-OPERATIVE DIAGNOSIS:  LEFT ACRMIOCLAVICULAR JOINT DEGENERATIVE JOINT DISEASE, SEVERE IMPINGEMENT, SUBSCAPULARIS TENDINOPATHY AND SUPRASPINATUS TENDINOPATHY  POST-OPERATIVE DIAGNOSIS:  LEFT ACRMIOCLAVICULAR JOINT DEGENERATIVE JOINT DISEASE, SEVERE IMPINGEMENT, ROTATOR CUFF TEAR, GRADE 2 SUBSCAPULARIS TEAR, 80% PASTA TEAR OF SUPRASPINATUS  PROCEDURE:  Procedure(s) (LRB) with comments: SHOULDER ARTHROSCOPY WITH SUBACROMIAL DECOMPRESSION (Left) - LEFT SHOULDER  ARTHROSCOPY WITH SUBACROMIAL DECOMPRESSION, DISTAL CLAVICLE RESECTION, ARTHROSCOPIC SUBSCAPULARIS REPAIR AND OPEN REPAIR OF SUPRASPINATOUS TENDON  SURGEON:  Surgeon(s) and Role:    * Wyn Forster., MD - Primary  PHYSICIAN ASSISTANT:   ASSISTANTS:Pearlie Nies Dasnoit,P.A-C   ANESTHESIA:   general  EBL:  Total I/O In: 1700 [I.V.:1700] Out: -   BLOOD ADMINISTERED:none  DRAINS: none   LOCAL MEDICATIONS USED:  ropivicaine plexus block preop  SPECIMEN:  No Specimen  DISPOSITION OF SPECIMEN:  N/A  COUNTS:  YES  TOURNIQUET:  * No tourniquets in log *  DICTATION: .Other Dictation: Dictation Number [id number]  PLAN OF CARE: Admit for overnight observation  PATIENT DISPOSITION:  PACU - hemodynamically stable.

## 2012-10-23 NOTE — Op Note (Signed)
[id number] 

## 2012-10-23 NOTE — Transfer of Care (Signed)
Immediate Anesthesia Transfer of Care Note  Patient: Melanie Avila  Procedure(s) Performed: Procedure(s) (LRB) with comments: SHOULDER ARTHROSCOPY WITH SUBACROMIAL DECOMPRESSION (Left) - LEFT SHOULDER  ARTHROSCOPY WITH SUBACROMIAL DECOMPRESSION, DISTAL CLAVICLE RESECTION, ARTHROSCOPIC SUBSCAPULARIS REPAIR AND OPEN REPAIR OF SUPRASPINATOUS TENDON  Patient Location: PACU  Anesthesia Type:General and Regional  Level of Consciousness: awake, alert , oriented and patient cooperative  Airway & Oxygen Therapy: Patient Spontanous Breathing and Patient connected to face mask oxygen  Post-op Assessment: Report given to PACU RN and Post -op Vital signs reviewed and stable  Post vital signs: Reviewed and stable  Complications: No apparent anesthesia complications

## 2012-10-23 NOTE — Anesthesia Preprocedure Evaluation (Signed)
Anesthesia Evaluation  Patient identified by MRN, date of birth, ID band Patient awake    Reviewed: Allergy & Precautions, H&P , NPO status , Patient's Chart, lab work & pertinent test results  Airway Mallampati: III TM Distance: >3 FB Neck ROM: Full  Mouth opening: Limited Mouth Opening  Dental   Pulmonary          Cardiovascular hypertension, Pt. on medications     Neuro/Psych    GI/Hepatic GERD-  Medicated and Controlled,  Endo/Other    Renal/GU      Musculoskeletal   Abdominal   Peds  Hematology   Anesthesia Other Findings   Reproductive/Obstetrics                           Anesthesia Physical Anesthesia Plan  ASA: III  Anesthesia Plan: General   Post-op Pain Management:    Induction: Intravenous  Airway Management Planned: Oral ETT and Video Laryngoscope Planned  Additional Equipment:   Intra-op Plan:   Post-operative Plan: Extubation in OR  Informed Consent: I have reviewed the patients History and Physical, chart, labs and discussed the procedure including the risks, benefits and alternatives for the proposed anesthesia with the patient or authorized representative who has indicated his/her understanding and acceptance.     Plan Discussed with: CRNA and Surgeon  Anesthesia Plan Comments:         Anesthesia Quick Evaluation

## 2012-10-23 NOTE — Progress Notes (Signed)
AssistedDr. Ossey with left, ultrasound guided, interscalene  block. Side rails up, monitors on throughout procedure. See vital signs in flow sheet. Tolerated Procedure well.  

## 2012-10-23 NOTE — Anesthesia Procedure Notes (Addendum)
Anesthesia Regional Block:  Interscalene brachial plexus block  Pre-Anesthetic Checklist: ,, timeout performed, Correct Patient, Correct Site, Correct Laterality, Correct Procedure, Correct Position, site marked, Risks and benefits discussed,  Surgical consent,  Pre-op evaluation,  At surgeon's request and post-op pain management  Laterality: Left  Prep: chloraprep       Needles:  Injection technique: Single-shot  Needle Type: Echogenic Stimulator Needle     Needle Length: 5cm 5 cm     Additional Needles:  Procedures: ultrasound guided (picture in chart) and nerve stimulator Interscalene brachial plexus block  Nerve Stimulator or Paresthesia:  Response: 0.4 mA,   Additional Responses:   Narrative:  Start time: 10/23/2012 9:39 AM End time: 10/23/2012 9:47 AM Injection made incrementally with aspirations every 5 mL.  Performed by: Personally  Anesthesiologist: Arta Bruce MD  Additional Notes: Monitors applied. Patient sedated. Sterile prep and drape,hand hygiene and sterile gloves were used. Relevant anatomy identified.Needle position confirmed.Local anesthetic injected incrementally after negative aspiration. Local anesthetic spread visualized around nerve(s). Vascular puncture avoided. No complications. Image printed for medical record.The patient tolerated the procedure well.       Interscalene brachial plexus block Procedure Name: Intubation Date/Time: 10/23/2012 11:22 AM Performed by: Ajia Chadderdon D Pre-anesthesia Checklist: Patient identified, Emergency Drugs available, Suction available and Patient being monitored Patient Re-evaluated:Patient Re-evaluated prior to inductionOxygen Delivery Method: Circle System Utilized Preoxygenation: Pre-oxygenation with 100% oxygen Intubation Type: IV induction Ventilation: Mask ventilation without difficulty Grade View: Grade I Tube type: Oral Tube size: 7.0 mm Number of attempts: 1 Airway Equipment and Method:  stylet and Video-laryngoscopy Placement Confirmation: ETT inserted through vocal cords under direct vision,  positive ETCO2 and breath sounds checked- equal and bilateral Secured at: 21 cm Tube secured with: Tape Dental Injury: Teeth and Oropharynx as per pre-operative assessment

## 2012-10-24 NOTE — Op Note (Signed)
Melanie Avila, Melanie Avila NO.:  [id number]  MEDICAL RECORD NO.:  [id number]  LOCATION:                                 FACILITY:  PHYSICIAN:  Melanie Avila, M.D. DATE OF BIRTH:  December 25, 1960  DATE OF PROCEDURE:  10/23/2012 DATE OF DISCHARGE:                              OPERATIVE REPORT   PREOPERATIVE DIAGNOSES: 1. MRI documented severe rotator cuff tendinopathy of supraspinatus     and subscapularis. 2. Significant acromioclavicular degenerative arthritis with very     unfavorable medial acromial morphology and distal clavicle inferior     osteophyte.  POSTOPERATIVE DIAGNOSES: 1. Grade 2 tear subscapularis with significant degenerative     tendinopathy. 2. An 80% partial articular sided tendon avulsion tear of     supraspinatus with watershed zone tendon degenerative tearing and     avulsion from greater tuberosity. 3. Moderate labral degenerative changes.  OPERATION: 1. Diagnostic arthroscopy, left glenohumeral joint. 2. Arthroscopic subacromial decompression with bursectomy,     coracoacromial ligament resection and acromioplasty. 3. Arthroscopic distal clavicle resection. 4. Arthroscopic repair of subscapularis to decorticated lesser     tuberosity. 5. Arthroscopic and open repair of 80% partial articular sided tendon     avulsion lesion of supraspinatus through anterior middle third     deltoid muscle splitting incision.  OPERATING SURGEON:  Melanie Fitch. Keniel Ralston, MD  ASSISTANT:  Marveen Reeks Dasnoit, PA-C  ANESTHESIA:  General endotracheal technique supplemented by ropivacaine left plexus block placed preoperatively by Dr. Michelle Piper with ultrasound guidance.  INDICATIONS:  Melanie Avila is a 51 year old woman well acquainted with our practice.  She is status post reconstruction of her right rotator cuff in the past. She returned with increasing pain on the left side, and clinical findings of severe impingement and likely subscapularis and supraspinatus  rotator cuff tendinopathy versus tear.  MRI evaluation of her shoulder demonstrated significant tendinopathy of the superior half of the subscapularis with a likely avulsion and considerable watershed zone tendinopathy of the supraspinatus.  Melanie Avila was in quite a bit of discomfort and requested that we intervene at this time.  She is fully aware that I will be on a sabbatical during the first quarter of 2014.  She understands that the second half of her care will be under the supervision of my partners and Dr. Dion Saucier if necessary.  After informed consent, she is brought to the operating room at this time.  PROCEDURE:  Melanie Avila was interviewed in the holding area.  Dr. Michelle Piper had provided detailed anesthesia and informed consent and then placed a ropivacaine plexus block with ultrasound guidance with excellent results.  Melanie Avila left arm was marked for the proper surgical site identification protocol with a marker.  She was subsequently transferred to room 2 of the [facility] where under Dr. Deirdre Priest direct supervision, general endotracheal anesthesia was induced followed by careful position in the beach-chair position with aid of a torso and head holder designed for shoulder arthroscopy.  The entire left upper extremity and forequarter prepped with DuraPrep and draped with impervious arthroscopy drapes.  Passive compression devices were applied to her cast for deep vein thrombosis prophylaxis.  Examination  of the left shoulder under anesthesia revealed minimal evidence of adhesive capsulitis and no signs of glenohumeral instability.  The arthroscope was introduced through the posterior viewing portal blunt technique using anterior switching stick technique.  The diagnostic arthroscopy of the glenohumeral joint revealed a moderate degree of granulation tissue, and degenerative changes of the labrum. There was an 80% PASTA tear of the supraspinatus that was  quite fibrillated.  There was a 50% tear of the subscapularis that was documented with the digital camera followed by debridement.  We debrided the supraspinatus and decorticated the greater tuberosity with a suction shaver brought in anteriorly.  We then created an anterior superior lateral portal and performed a decortication of the lesser tuberosity and debridement of the subscapularis.  A reverse mattress suture of FiberTape was placed with a scorpion suture passer brought through a clear cannula through the anterior, superior and lateral portal.  The clear cannula was then transferred to the anterior portal followed by placement of a 4.75-mm swivel lock securing the repaired subscapularis to the decorticated lesser tuberosity.  An excellent footprint was achieved as well as a buttress for the medial gutter of the bicipital groove.  We then removed the scope from the glenohumeral joint and placed in the subacromial space.  Abundant adipose tissue was noted in the subacromial space followed by extensive lipectomy and bursectomy.  We studied the anatomy of the coracoacromial arch.  There was a type 2-3 acromion with large osteophyte medially at the site of the [facility] joint.  The AC capsule was quite prominent.  We used the cutting cautery to take down the [facility] capsule followed by removal of the distal a centimeter of clavicle with a suction burr.  Hemostasis was achieved with bipolar cautery.  The acromion was leveled to a type 1 morphology with release of the coracoacromial ligament hemostasis.  We then studied the bursa.  The PASTA tear was not particularly amenable to arthroscopic technique due to Melanie Avila being a mesomorph.  We subsequently replaced the scope in the glenohumeral joint and completed a debridement of the deep surface of the supraspinatus with a suction shaver brought in anteriorly.  We then removed the arthroscopic equipment and created a 3-cm anterior middle third  deltoid muscle-splitting incision.  The site of the defect was easily palpable.  I incised the remaining fibers of the superficial bursal side of the supraspinatus and identified the area of significant degenerative tendinopathy.  The 4.2-mm suction shaver was used to thoroughly debride the tendon followed by decortication of the greater tuberosity further.  Direct vision a 4.75-mm swivel lock was placed with FiberTape and FiberWire.  This was then used with a Mayo needle to create 2 mattress sutures.  A knot pusher was used to tie the more medial footprint restoring FiberWire suture followed by passage of the FiberTape more laterally.  The tails of the FiberTape were then tied with a simple square knot.  The 4 tails of the 2 sutures were then placed through a lateral 4.57-mm swivel lock to create a compression and low profile repair.  A very satisfactory reconstruction was achieved.  A hand rasp was used to bevel the lateral margin of the acromion under direct vision.  Hemostasis was achieved followed by irrigation of subacromial space. The scope was placed in the glenohumeral joint and an excellent footprint of repair was documented with digital camera.  The long head of the biceps was not impeded by any stitches.  After final debridement of glenohumeral joint,  the scope was removed. The portals were repaired with intradermal 3-0 Prolene.  The incision was repaired with mattress sutures of 0 Vicryl repairing the deltoid split followed by skin repair with subcutaneous 0 Vicryl and 2-0 Vicryl and intradermal 3-0 Prolene.  The wounds were then dressed with sterile gauze, paper tape, and Melanie Avila was placed in a sling for postoperative comfort.  She was awakened from general anesthesia and transferred to the recovery room with stable signs.  We anticipate admission to recovery care center for observation of the vital signs, and prophylactic antibiotics in the form of Ancef 1 g q.6  hours x3 doses.     Melanie Avila, M.D.     RVS/MEDQ  D:  10/23/2012  T:  10/24/2012  Job:  [id number]

## 2012-10-25 ENCOUNTER — Encounter (HOSPITAL_BASED_OUTPATIENT_CLINIC_OR_DEPARTMENT_OTHER): Payer: Self-pay | Admitting: Orthopedic Surgery

## 2012-10-26 ENCOUNTER — Encounter (HOSPITAL_BASED_OUTPATIENT_CLINIC_OR_DEPARTMENT_OTHER): Payer: Self-pay

## 2013-09-05 ENCOUNTER — Other Ambulatory Visit: Payer: Self-pay | Admitting: Obstetrics & Gynecology

## 2014-10-22 ENCOUNTER — Other Ambulatory Visit: Payer: Self-pay | Admitting: Obstetrics & Gynecology

## 2014-11-19 ENCOUNTER — Encounter: Payer: Self-pay | Admitting: Neurology

## 2014-11-19 ENCOUNTER — Other Ambulatory Visit: Payer: Self-pay | Admitting: Neurology

## 2014-11-19 ENCOUNTER — Ambulatory Visit (INDEPENDENT_AMBULATORY_CARE_PROVIDER_SITE_OTHER): Payer: BC Managed Care – PPO | Admitting: Neurology

## 2014-11-19 VITALS — BP 129/74 | HR 103 | Ht 67.0 in | Wt 180.8 lb

## 2014-11-19 DIAGNOSIS — M79605 Pain in left leg: Secondary | ICD-10-CM

## 2014-11-19 DIAGNOSIS — R29898 Other symptoms and signs involving the musculoskeletal system: Secondary | ICD-10-CM

## 2014-11-19 DIAGNOSIS — M5417 Radiculopathy, lumbosacral region: Secondary | ICD-10-CM

## 2014-11-19 NOTE — Progress Notes (Signed)
[facility]    Provider:  Dr Lucia Gaskins Referring Provider: Catalina Pizza, MD Primary Care Physician:  Catalina Pizza, MD  CC: left leg and  Foot pain  HPI:  Melanie Avila is a 53 y.o. female here as a referral from Dr. Margo Aye for left foot pain. She has chronic pain due to her fibromyalgia.    She is having knee pain. She has chronic LBP and hx of 2 back surgeries, fibromyalgia. She has chronic weakness in the toes attributed to lumbar radiculopathy. She was at work in early November and she fell on the kitchen floor, landed on her right hip and right elbow. Her left knee twisted and she damaged it. Since then the knee has been hurting. She hasn't fallen since then. It still hurts, with stiffness in the knee. She has chronic LBP with radiation down the back of the left leg. She has foot pain around the left foot ankle. The toes have numbness and tingling. Right foot sometimes gets numb but it is positional. She can just be walking and the left leg gives out. She feels weakness in her left leg, more weakness. She has fallen 3 times due to this. She also fell in the driveway, about 3x times since November for left leg giving out. No other focal neurologic symptoms.   Reviewed notes, labs and imaging from outside physicians, which showed: right median neuropathy (reviewed emg/ncs data)  Review of Systems: Patient complains of symptoms per HPI as well as the following symptoms: cough, wheezing, snoring, cramps, aching muscles, numbness, weakness, sleepiness, snoring, shift work, restless leg, depression, anxiety. Pertinent negatives per HPI. All others negative.   History   Social History  . Marital Status: Married    Spouse Name: Randi     Number of Children: 2  . Years of Education: 12   Occupational History  . Not on file.   Social History Main Topics  . Smoking status: Current Every Day Smoker -- 1.00 packs/day for 35 years    Types: Cigarettes  . Smokeless tobacco: Never  Used  . Alcohol Use: No  . Drug Use: Not on file  . Sexual Activity: Not on file   Other Topics Concern  . Not on file   Social History Narrative   Patient lives at home with husband and son   Patient has 2 children    Patient has a high school education    Patient works has [employer]   Patient is right handed     Family History  Problem Relation Age of Onset  . Alzheimer's disease Mother   . Prostate cancer Father   . Leukemia Father   . Heart attack Maternal Aunt   . Heart attack Maternal Uncle     Past Medical History  Diagnosis Date  . Fibromyalgia   . Anemia   . Anxiety   . GERD (gastroesophageal reflux disease)   . Hyperlipidemia   . Hypertension   . Depression   . DDD (degenerative disc disease)   . Complication of anesthesia     last shoulder surg 2010-dsc-2 hr after block to surg-neck swelled-hard to tube-stayed RCC    Past Surgical History  Procedure Laterality Date  . Carpal tunnel release    . Shoulder surgery    . Tubal ligation    . Back surgery  89,02    lumbar x2  . Shoulder arthroscopy with subacromial decompression  10/23/2012    Procedure: SHOULDER ARTHROSCOPY WITH SUBACROMIAL DECOMPRESSION;  Surgeon: Wyn Forsterobert V Sypher Jr., MD;  [facility] [place];  Service: Orthopedics;  Laterality: Left;  LEFT SHOULDER  ARTHROSCOPY WITH SUBACROMIAL DECOMPRESSION, DISTAL CLAVICLE RESECTION, ARTHROSCOPIC SUBSCAPULARIS REPAIR AND OPEN REPAIR OF SUPRASPINATOUS TENDON    Current Outpatient Prescriptions  Medication Sig Dispense Refill  . ALPRAZolam (XANAX) 1 MG tablet Take 1 mg by mouth 2 (two) times daily as needed. 1 TO 1 1/2 QHS    . atorvastatin (LIPITOR) 20 MG tablet 20 mg daily.    . carisoprodol (SOMA) 350 MG tablet Take 1 tablet (350 mg total) by mouth every 8 (eight) hours as needed for muscle spasms. 75 tablet 3  . diclofenac (VOLTAREN) 75 MG EC tablet Take 75 mg by mouth 2 (two) times daily.    Marland Kitchen. estradiol (ESTRACE) 2 MG tablet TAKE 1  TABLET DAILY 90 tablet 2  . HYDROcodone-acetaminophen (NORCO) 7.5-325 MG per tablet Take 1 tablet by mouth every 4 (four) hours as needed for pain. 180 tablet 0  . medroxyPROGESTERone (PROVERA) 2.5 MG tablet     . NUCYNTA ER 50 MG TB12     . omeprazole (PRILOSEC) 20 MG capsule Take 20 mg by mouth as needed.     Marland Kitchen. oxymorphone (OPANA ER) 30 MG 12 hr tablet Take 1 tablet (30 mg total) by mouth every 12 (twelve) hours. 60 tablet 0  . sertraline (ZOLOFT) 100 MG tablet     . telmisartan-hydrochlorothiazide (MICARDIS HCT) 80-25 MG per tablet Take 1 tablet by mouth daily.     No current facility-administered medications for this visit.    Allergies as of 11/19/2014 - Review Complete 11/19/2014  Allergen Reaction Noted  . Cymbalta [duloxetine hcl]  01/05/2012  . Sulfa antibiotics Swelling 09/29/2011  . Effexor [venlafaxine hydrochloride]  01/05/2012    Vitals: BP 129/74 mmHg  Pulse 103  Ht 5\' 7"  (1.702 m)  Wt 180 lb 12.8 oz (82.01 kg)  BMI 28.31 kg/m2 Last Weight:  Wt Readings from Last 1 Encounters:  11/19/14 180 lb 12.8 oz (82.01 kg)   Last Height:   Ht Readings from Last 1 Encounters:  11/19/14 5\' 7"  (1.702 m)    Physical exam: Exam: Gen: NAD, conversant, well nourised                    CV: RRR, no MRG. No Carotid Bruits. No peripheral edema, warm, nontender Eyes: Conjunctivae clear without exudates or hemorrhage  Neuro: Detailed Neurologic Exam  Speech:    Speech is normal; fluent and spontaneous with normal comprehension.  Cognition:    The patient is oriented to person, place, and time;     recent and remote memory intact;     language fluent;     normal attention, concentration,     fund of knowledge Cranial Nerves:    The pupils are equal, round, and reactive to light. The fundi areflat. Visual fields are full to finger confrontation. Extraocular movements are intact. Trigeminal sensation is intact and the muscles of mastication are normal. The face is symmetric.  The palate elevates in the midline. Voice is normal. Shoulder shrug is normal. The tongue has normal motion without fasciculations.   Coordination:    Normal finger to nose and heel to shin. Normal rapid alternating movements.   Gait:    Normal native gait  Motor Observation:    No asymmetry, no atrophy, and no involuntary movements noted. Tone:    Normal muscle tone.    Posture:    Posture is  normal. normal erect    Strength: Left hip flexion 3/5, HS 4+/5     Sensation:     Left lateral foot and leg numbness   Reflex Exam:  DTR's: Absent left ankle jerk  Toes:    The toes are downgoing bilaterally.   Clonus:    Clonus is absent.      Assessment/Plan:  53 year old female with PMHx chronic pain and Fibromyalgia, depression, anxiety with chronic LBP and hx of 2 back surgeries, fibromyalgia with left leg weakness, numbness, radicular pain after a fall. Will order EMG/NCS and MRI lumbar spine.   Naomie DeanAntonia Amarachukwu Lakatos, MD  [facility] [address] [place], [place] [id number]  Phone [phone] Fax [phone]

## 2014-11-22 DIAGNOSIS — M79605 Pain in left leg: Secondary | ICD-10-CM | POA: Insufficient documentation

## 2014-12-03 ENCOUNTER — Ambulatory Visit (INDEPENDENT_AMBULATORY_CARE_PROVIDER_SITE_OTHER): Payer: BC Managed Care – PPO

## 2014-12-03 ENCOUNTER — Ambulatory Visit (INDEPENDENT_AMBULATORY_CARE_PROVIDER_SITE_OTHER): Payer: Self-pay | Admitting: Neurology

## 2014-12-03 ENCOUNTER — Other Ambulatory Visit: Payer: Self-pay | Admitting: Neurology

## 2014-12-03 ENCOUNTER — Ambulatory Visit (INDEPENDENT_AMBULATORY_CARE_PROVIDER_SITE_OTHER): Payer: BC Managed Care – PPO | Admitting: Neurology

## 2014-12-03 DIAGNOSIS — M5416 Radiculopathy, lumbar region: Secondary | ICD-10-CM

## 2014-12-03 DIAGNOSIS — R202 Paresthesia of skin: Secondary | ICD-10-CM

## 2014-12-03 DIAGNOSIS — R29898 Other symptoms and signs involving the musculoskeletal system: Secondary | ICD-10-CM

## 2014-12-03 DIAGNOSIS — M5417 Radiculopathy, lumbosacral region: Secondary | ICD-10-CM

## 2014-12-03 NOTE — Progress Notes (Signed)
[facility]    Provider:  Dr Lucia GaskinsAhern Referring Provider: Catalina PizzaHall, [name], MD Primary Care Physician:  Catalina PizzaHALL, [name], MD  History: Melanie Avila is a 53 y.o. female here as a referral from Dr. [name] for left foot and leg pain. She has chronic pain due to her fibromyalgia.  She is having knee pain. She has chronic LBP and hx of 2 back surgeries, fibromyalgia. She has chronic weakness in the toes attributed to lumbar radiculopathy. She was at work in early November and she fell on the kitchen floor, landed on her right hip and right elbow. Her left knee twisted and she damaged it. Since then the knee has been hurting. She hasn't fallen since then. It still hurts, with stiffness in the knee. She has chronic LBP with radiation down the back of the left leg. She has foot pain around the left foot and ankle. The toes have numbness and tingling. Right foot sometimes gets numb but it is positional. She can just be walking and the left leg gives out. She feels weakness in her left leg. She has fallen 3 times due to this. She also fell in the driveway, about 3x times since November for left leg giving out. No other focal neurologic symptoms.    Summary  Nerve conduction studies were performed on the bilateral lower extremities:  Peroneal motor nerves showed normal conductions with normal F Wave latencies Tibial motor nerves showed normal conductions with normal F Wave latencies Sural and Peroneal sensory nerves were within normal limits Bilateral H Reflexes showed normal latencies  EMG Needle study was performed on selected left lower extremity muscles:   The Vastus Medialis, Anterior Tibialis, Medial Gastrocnemius, Biceps Femoris(short and long heads), Gluteus Medius, Gluteus Maximus muscles were within normal limits. Could not perform EMG needle exam on the paraspinals as they are unreliable after surgery.   Conclusion: This is a normal study. No electrophysiologic evidence for large-fiber  peripheral neuropathy or radiculopathy. Clinical correlation recommended.   [name]. [name], MD  [facility] [address] [place], [place] [id number]

## 2014-12-03 NOTE — Progress Notes (Signed)
  [facility]    Provider:  Dr Melanie Avila Referring Provider: [name], [name], Melanie Avila Primary Care Physician:  Melanie Avila, [name], Melanie Avila  History: Melanie Avila is a 53 y.o. female here as a referral from Melanie. [name] for left foot and leg pain. She has chronic pain due to her fibromyalgia.  She is having knee pain. She has chronic LBP and hx of 2 back surgeries, fibromyalgia. She has chronic weakness in the toes attributed to lumbar radiculopathy. She was at work in early November and she fell on the kitchen floor, landed on her right hip and right elbow. Her left knee twisted and she damaged it. Since then the knee has been hurting. She hasn't fallen since then. It still hurts, with stiffness in the knee. She has chronic LBP with radiation down the back of the left leg. She has foot pain around the left foot and ankle. The toes have numbness and tingling. Right foot sometimes gets numb but it is positional. She can just be walking and the left leg gives out. She feels weakness in her left leg. She has fallen 3 times due to this. She also fell in the driveway, about 3x times since November for left leg giving out. No other focal neurologic symptoms.    Summary  Nerve conduction studies were performed on the bilateral lower extremities:  Peroneal motor nerves showed normal conductions with normal F Wave latencies Tibial motor nerves showed normal conductions with normal F Wave latencies Sural and Peroneal sensory nerves were within normal limits Bilateral H Reflexes showed normal latencies  EMG Needle study was performed on selected left lower extremity muscles:   The Vastus Medialis, Anterior Tibialis, Medial Gastrocnemius, Biceps Femoris(short and long heads), Gluteus Medius, Gluteus Maximus muscles were within normal limits. Could not perform EMG needle exam on the paraspinals as they are unreliable after surgery.   Conclusion: This is a normal study. No electrophysiologic evidence for large-fiber  peripheral neuropathy or radiculopathy. Clinical correlation recommended.   [name]. Melanie Ramakrishnan, Melanie Avila  [facility] [address] [place], [place] [id number]  Phone [phone] Fax [phone]

## 2014-12-06 ENCOUNTER — Inpatient Hospital Stay: Admission: RE | Admit: 2014-12-06 | Payer: BC Managed Care – PPO | Source: Ambulatory Visit

## 2014-12-12 ENCOUNTER — Telehealth: Payer: Self-pay | Admitting: Neurology

## 2014-12-12 ENCOUNTER — Other Ambulatory Visit: Payer: Self-pay | Admitting: Neurology

## 2014-12-12 DIAGNOSIS — R269 Unspecified abnormalities of gait and mobility: Secondary | ICD-10-CM

## 2014-12-12 DIAGNOSIS — M5417 Radiculopathy, lumbosacral region: Secondary | ICD-10-CM

## 2014-12-12 NOTE — Telephone Encounter (Signed)
Called patient. MRi of the lumbar spine showed  potential impingement upon the exiting left L5 and descending left S1 roots. This likely is causing her symptoms. Will try epidural steroid injections and then if that doesn't work will refer to [facility] or orthopaedics.

## 2014-12-15 ENCOUNTER — Telehealth: Payer: Self-pay | Admitting: Neurology

## 2014-12-15 NOTE — Telephone Encounter (Signed)
Do not see where the patient was taken out of work. Please advise.

## 2014-12-15 NOTE — Telephone Encounter (Signed)
Patient wants to know if Dr. Lucia GaskinsAhern talked to patient's nurse, Clent JacksKathy Wall, regarding letting patient go back to work. She is scheduled to go back on the 16th. Best number to call is [phone]. Okay to leave detailed message at this number.

## 2014-12-23 NOTE — Telephone Encounter (Signed)
Called and left patient a detailed message about her letter. Any questions she is to call the office.

## 2014-12-23 NOTE — Telephone Encounter (Signed)
Patient call again to check status of being out of work.  Please call and advise.

## 2014-12-23 NOTE — Telephone Encounter (Signed)
Can you please call patient and let her know that I received her job description on Friday. I spoke to her manager as well. Her manager requested a letter, I should have it done sometime tomorrow and will fax it to her manager. thanks

## 2014-12-24 ENCOUNTER — Ambulatory Visit: Payer: BLUE CROSS/BLUE SHIELD | Attending: Neurology

## 2014-12-24 ENCOUNTER — Telehealth: Payer: Self-pay | Admitting: *Deleted

## 2014-12-24 ENCOUNTER — Encounter: Payer: Self-pay | Admitting: Neurology

## 2014-12-24 DIAGNOSIS — R279 Unspecified lack of coordination: Secondary | ICD-10-CM

## 2014-12-24 DIAGNOSIS — R262 Difficulty in walking, not elsewhere classified: Secondary | ICD-10-CM | POA: Diagnosis not present

## 2014-12-24 DIAGNOSIS — M5442 Lumbago with sciatica, left side: Secondary | ICD-10-CM | POA: Diagnosis not present

## 2014-12-24 NOTE — Telephone Encounter (Signed)
[facility]

## 2014-12-24 NOTE — Therapy (Addendum)
[facility] [address] [place], [place], [address] Phone: [phone]   Fax:  [phone]  Physical Therapy Evaluation  Patient Details  Name: Melanie Avila MRN: [id number] Date of Birth: 1961-05-03 Referring Provider:  Anson Fret, MD  Encounter Date: 12/24/2014      PT End of Session - 12/24/14 1943    Visit Number 1   Number of Visits 17   Date for PT Re-Evaluation 02/22/15   Authorization Type BCBS 90 visit limit   PT Start Time 1018   PT Stop Time 1102   PT Time Calculation (min) 44 min      Past Medical History  Diagnosis Date  . Fibromyalgia   . Anemia   . Anxiety   . GERD (gastroesophageal reflux disease)   . Hyperlipidemia   . Hypertension   . Depression   . DDD (degenerative disc disease)   . Complication of anesthesia     last shoulder surg 2010-dsc-2 hr after block to surg-neck swelled-hard to tube-stayed RCC    Past Surgical History  Procedure Laterality Date  . Carpal tunnel release    . Shoulder surgery    . Tubal ligation    . Back surgery  89,02    lumbar x2  . Shoulder arthroscopy with subacromial decompression  10/23/2012    Procedure: SHOULDER ARTHROSCOPY WITH SUBACROMIAL DECOMPRESSION;  Surgeon: Wyn Forster., MD;  Location: [facility];  Service: Orthopedics;  Laterality: Left;  LEFT SHOULDER  ARTHROSCOPY WITH SUBACROMIAL DECOMPRESSION, DISTAL CLAVICLE RESECTION, ARTHROSCOPIC SUBSCAPULARIS REPAIR AND OPEN REPAIR OF SUPRASPINATOUS TENDON    There were no vitals taken for this visit.  Visit Diagnosis:  Left-sided low back pain with left-sided sciatica - Plan: PT PLAN OF CARE CERT/RE-CERT  Difficulty walking - Plan: PT PLAN OF CARE CERT/RE-CERT  Lack of coordination - Plan: PT PLAN OF CARE CERT/RE-CERT      Subjective Assessment - 12/24/14 1032    Symptoms Pt presents for referral from neurologist due to gait abnormality. Pt reports she has had  numbness and tingling in LLE and low back pain more on the right side. She used orthotics at one time but they are too painful.    Pertinent History Lumbar spinal surgeries in 1989 and 2002; fibromyalgia   How long can you sit comfortably? 2 minutes; with tingling present withing 5 minutes   Patient Stated Goals To reduce pain and improve walking ability   Currently in Pain? Yes   Pain Score 6    Pain Location Back   Pain Orientation Lower;Left          [facility] PT Assessment - 12/24/14 0001    Assessment   Medical Diagnosis gait disorder   Onset Date --  1989   Prior Therapy 2002  prior to back surgery   Precautions   Precautions Fall  no heavy lifting   Restrictions   Weight Bearing Restrictions No   Balance Screen   Has the patient fallen in the past 6 months Yes   How many times? 5  left leg giving way while walking, tripping on elect. fence   Has the patient had a decrease in activity level because of a fear of falling?  Yes   Is the patient reluctant to leave their home because of a fear of falling?  Yes   Observation/Other Assessments   Focus on Therapeutic Outcomes (FOTO)  Functional Status 60   Other Surveys  --  ABC  33.1%   Strength   Overall Strength Due to pain  Left    Flexibility   Soft Tissue Assessment /Muscle Lenght --  tight hamstrings; tight calves bilaterally L>R   Palpation   Palpation tenderness on left low back/paraspinals and buttock   Ambulation/Gait   Ambulation/Gait Yes   Ambulation/Gait Assistance 5: Supervision   Ambulation/Gait Assistance Details unsteady and antalgic L   Ambulation Distance (Feet) 50 Feet   Assistive device None     Pain with movement: Forward flexion in standing demonstrated increased back pain; especially when rising to stand.  Lateral flexion in standing with pain when sidebending left; no pain with sidebending right Seated anterior pelvic tilt with relief noted Seated posterior pelvic tilt with severe sharp pain  and some radiation into left leg  With sitting, pain sets in between 2-5 minutes in back and left leg. Walking fast increases pain compared to walking slow.  Self care: Pt was educated regarding therapy findings and plan of care--specifically how therapy can benefit her impairments, and was taught how to perform piriformis stretch. Will need to provided a printed handout next session for this stretch.                      PT Short Term Goals - 12/24/14 1949    PT SHORT TERM GOAL #1   Title Demosntrate correct performance of HEP to address muscle weakness, pain, posture and balance impairment. Target 01/24/15   PT SHORT TERM GOAL #2   Title Complete Oswestry and write appropriate STG/LTG Target 01/24/15   PT SHORT TERM GOAL #3   Title Complete DGI and write appropriate STG/LTG Target 01/24/15   PT SHORT TERM GOAL #4   Title Report ability to tolerate sleeping in bed with modifications to postiion as needed for improved rest at night. Target 01/24/15   PT SHORT TERM GOAL #5   Title Demonstrate ability to sit >5 minutes without increase in pain. Target 01/24/15           PT Long Term Goals - 12/24/14 1953    PT LONG TERM GOAL #1   Title Verbalize understanding of posture/body mechanics for injury prevention. Target 02/22/15   PT LONG TERM GOAL #2   Title Verbalize understanding of fall prevention strategies for home environment.Target 02/22/15   PT LONG TERM GOAL #3   Title DGI LTG:_______________Target 02/22/15   PT LONG TERM GOAL #4   Title Oswestry Paper LTG: _____________Target 02/22/15   PT LONG TERM GOAL #5   Title Increase ABC score on FOTO to 53% for increased balance confidence. Target 02/22/15               Plan - 12/24/14 1944    Clinical Impression Statement Pt presents with history of 2 lumbar surgeries, history of ruptured lumbar disc, fibromyalgia, balance and gait impairment and chronic severe back pain that radiates down the left leg. Pt's pain  appears to be musculo-skeletal and postural in nature and she will likely benefit from skilled pt services to address this. In addidtion, pt's balance impairment is likely to decrease with balance training.   Pt will benefit from skilled therapeutic intervention in order to improve on the following deficits Abnormal gait;Decreased coordination;Difficulty walking;Impaired flexibility;Decreased endurance;Impaired sensation;Postural dysfunction;Decreased activity tolerance;Pain;Decreased balance;Decreased strength   Rehab Potential Good   Clinical Impairments Affecting Rehab Potential chronic nature of pain, fibromyalgia   PT Frequency 2x / week   PT Duration 8 weeks   PT Treatment/Interventions  ADLs/Self Care Home Management;Therapeutic activities;Patient/family education;Therapeutic exercise;Gait training;Balance training;Manual techniques;Stair training;Neuromuscular re-education;Electrical Stimulation;Moist Heat   PT Next Visit Plan complete DGI and oswestry and write appropriate goals. Provide HEP to include hamstring and calf stretching, spinal extensor strengthening, core stabilization, balance training   Consulted and Agree with Plan of Care Patient         Problem List Patient Active Problem List   Diagnosis Date Noted  . Left leg pain 11/22/2014  . Fibromyalgia 01/30/2012  . Depression with anxiety 01/30/2012  . Lumbar post-laminectomy syndrome 01/30/2012  . Thoracic or lumbosacral neuritis or radiculitis, unspecified 01/30/2012    [name] 12/24/2014, 8:01 PM  [facility] [address] [place], [place], [address] Phone: [phone]   Fax:  [phone]   Lamar Laundry, PT,DPT,NCS 12/24/2014 8:04 PM Phone [phone] FAX [phone]

## 2014-12-30 ENCOUNTER — Ambulatory Visit: Payer: BLUE CROSS/BLUE SHIELD

## 2014-12-30 ENCOUNTER — Telehealth: Payer: Self-pay | Admitting: *Deleted

## 2014-12-30 DIAGNOSIS — R262 Difficulty in walking, not elsewhere classified: Secondary | ICD-10-CM

## 2014-12-30 DIAGNOSIS — R279 Unspecified lack of coordination: Secondary | ICD-10-CM

## 2014-12-30 DIAGNOSIS — M5442 Lumbago with sciatica, left side: Secondary | ICD-10-CM | POA: Diagnosis not present

## 2014-12-30 NOTE — Telephone Encounter (Signed)
Form on Stephanie desk.  

## 2014-12-30 NOTE — Patient Instructions (Signed)
   Weight Shift: Anterior / Posterior (Limits of Stability)   Stand in a corner with a chair in front of you and hold onto the chair as needed for balance. Slowly shift weight backward until toes begin to rise off floor. Return to starting position. Shift weight slowly forward until heels begin to rise off floor. Hold each position 2 seconds. Repeat 20 times per session. Do 1 sessions every other day.   Copyright  VHI. All rights reserved.    FUNCTIONAL MOBILITY: Heel Walking   Hold onto counter. Walk forward on heels (keep back straight and don't stick buttocks out!). When you get to the end, walk backwards in the same manner. Do 3 laps every other day.  Copyright  VHI. All rights reserved.    Feet Heel-Toe "Tandem"   Hold onto counter.  Walk a straight line bringing one foot directly in front of the other. When you get to the end, walk backwards in the same manner. Do 3 laps every other day.  Copyright  VHI. All rights reserved.    HIP: Hamstrings - Short Sitting   Scoot to edge of chair. Extend one leg and keep knee straight, and pull toes toward you. Lift chest and lean forward until you feel a gentle stretch. Don't overstretch! Hold 30 seconds. Perform 3x on each leg morning and night, daily.   Copyright  VHI. All rights reserved.   Gastrocnemius   Support with hands on wall or chair, back straight and chest out (the picture is incorrect--chest should be out). Place right foot back. Keep heels flat with right knee straight, gently bend left knee until you feel a gentle stretch. Hold 30 seconds. Perform 3x on each leg in morning and night. Do not raise up on toes. Do not sway or round back. Copyright  VHI. All rights reserved.    Axial Extension   Lie on stomach with forehead resting on floor and arms at sides. Tuck chin in and raise head and chest from floor without bending neck it up or down. Repeat 10 times per set. Do 3 sets per session. Do 1 sessions  every other day.  http://orth.exer.us/971   Copyright  VHI. All rights reserved.    Isometric Core Strengthening laying on bed part A, B and C a. Legs:  i. Lay on bed with feet making a "v" ii. Squeeze heels together and dig your legs down into the bed b. Abs: (do this at the same time as "legs") i. Now tighten your deep abdominal muscles by pulling your belly button into your spine (you can feel the muscle tighten if you place fingertips just above your groin) ii. Hold this tight c. Arms: i. With your arms by your sides, press hands and arms into the bed, as if they are super glued on the bed, no one should be able to pull them up. ii. Hold this tight with the abs and feet. Hold as long as you can hold all three. Perform 5 sets

## 2014-12-30 NOTE — Therapy (Signed)
[facility] [address] [place], [place], [address] Phone: [phone]   Fax:  [phone]  Physical Therapy Treatment  Patient Details  Name: EVELYNNE SPIERS MRN: [id number] Date of Birth: 08-Dec-1960 Referring Provider:  Catalina Pizza, MD  Encounter Date: 12/30/2014      PT End of Session - 12/30/14 1124    Visit Number 2   Number of Visits 17   Date for PT Re-Evaluation 02/22/15   Authorization Type BCBS 90 visit limit   PT Start Time 0930   PT Stop Time 1015   PT Time Calculation (min) 45 min      Past Medical History  Diagnosis Date  . Fibromyalgia   . Anemia   . Anxiety   . GERD (gastroesophageal reflux disease)   . Hyperlipidemia   . Hypertension   . Depression   . DDD (degenerative disc disease)   . Complication of anesthesia     last shoulder surg 2010-dsc-2 hr after block to surg-neck swelled-hard to tube-stayed RCC    Past Surgical History  Procedure Laterality Date  . Carpal tunnel release    . Shoulder surgery    . Tubal ligation    . Back surgery  89,02    lumbar x2  . Shoulder arthroscopy with subacromial decompression  10/23/2012    Procedure: SHOULDER ARTHROSCOPY WITH SUBACROMIAL DECOMPRESSION;  Surgeon: Wyn Forster., MD;  Location: [facility];  Service: Orthopedics;  Laterality: Left;  LEFT SHOULDER  ARTHROSCOPY WITH SUBACROMIAL DECOMPRESSION, DISTAL CLAVICLE RESECTION, ARTHROSCOPIC SUBSCAPULARIS REPAIR AND OPEN REPAIR OF SUPRASPINATOUS TENDON    There were no vitals taken for this visit.  Visit Diagnosis:  Left-sided low back pain with left-sided sciatica  Difficulty walking  Lack of coordination      Subjective Assessment - 12/30/14 0933    Symptoms "I'm no good until the evening"   Currently in Pain? Yes   Pain Score 6    Pain Location Back  Pt reports this pain is normal in the AM   Pain Orientation Mid;Lower          [facility] PT Assessment -  12/30/14 0001    Standardized Balance Assessment   Standardized Balance Assessment Dynamic Gait Index   Dynamic Gait Index   Level Surface Moderate Impairment   Change in Gait Speed Normal   Gait with Horizontal Head Turns Mild Impairment   Gait with Vertical Head Turns Moderate Impairment   Gait and Pivot Turn Normal   Step Over Obstacle Mild Impairment   Step Around Obstacles Normal   Steps Mild Impairment   Total Score 17       Completed DGI see above.  The patient was taught, performed, and was provided with a home exercise program to address balance, core strengthening and BLE flexibility. See pt instructions for details.                     PT Education - 12/30/14 1123    Education provided Yes   Education Details HEP for balance, stretching and core strengthening   Person(s) Educated Patient   Methods Explanation;Demonstration;Handout   Comprehension Verbalized understanding;Returned demonstration          PT Short Term Goals - 12/30/14 1126    PT SHORT TERM GOAL #1   Title Demosntrate correct performance of HEP to address muscle weakness, pain, posture and balance impairment. Target 01/24/15   PT SHORT TERM GOAL #2   Title  Complete Oswestry and write appropriate STG/LTG Target 01/24/15   PT SHORT TERM GOAL #3   Title Increase score on DGI to 19/24 Target 01/24/15   PT SHORT TERM GOAL #4   Title Report ability to tolerate sleeping in bed with modifications to postiion as needed for improved rest at night. Target 01/24/15   PT SHORT TERM GOAL #5   Title Demonstrate ability to sit >5 minutes without increase in pain. Target 01/24/15           PT Long Term Goals - 12/30/14 1126    PT LONG TERM GOAL #1   Title Verbalize understanding of posture/body mechanics for injury prevention. Target 02/22/15   PT LONG TERM GOAL #2   Title Verbalize understanding of fall prevention strategies for home environment.Target 02/22/15   PT LONG TERM GOAL #3   Title  Increase DGI score to 22/24 for decreased fall risk. Target 02/22/15   PT LONG TERM GOAL #4   Title Oswestry Paper LTG: _____________Target 02/22/15   PT LONG TERM GOAL #5   Title Increase ABC score on FOTO to 53% for increased balance confidence. Target 02/22/15               Plan - 12/30/14 1124    Clinical Impression Statement Pt demonstrated good understanding of the exercises provided today. She appears motivated to perform them. Expected to progress to goals.   PT Next Visit Plan Complete Oswestry and goal. Review HEP. Balance+core strength        Problem List Patient Active Problem List   Diagnosis Date Noted  . Left leg pain 11/22/2014  . Fibromyalgia 01/30/2012  . Depression with anxiety 01/30/2012  . Lumbar post-laminectomy syndrome 01/30/2012  . Thoracic or lumbosacral neuritis or radiculitis, unspecified 01/30/2012    Lamar LaundryAlderson, Davonna Ertl D 12/30/2014, 11:28 AM  [facility] [address] [place], [place], [address] Phone: [phone]   Fax:  [phone]

## 2015-01-02 ENCOUNTER — Ambulatory Visit: Payer: BLUE CROSS/BLUE SHIELD

## 2015-01-02 DIAGNOSIS — M5442 Lumbago with sciatica, left side: Secondary | ICD-10-CM | POA: Diagnosis not present

## 2015-01-02 DIAGNOSIS — R262 Difficulty in walking, not elsewhere classified: Secondary | ICD-10-CM

## 2015-01-02 DIAGNOSIS — R279 Unspecified lack of coordination: Secondary | ICD-10-CM

## 2015-01-02 NOTE — Therapy (Signed)
[facility] [address] [place], [place], [address] Phone: [phone]   Fax:  [phone]  Physical Therapy Treatment  Patient Details  Name: Melanie Avila MRN: [id number] Date of Birth: 08/17/1961 Referring Provider:  Catalina PizzaHall, [name], MD  Encounter Date: 01/02/2015      PT End of Session - 01/03/15 1455    Visit Number 3   Number of Visits 17   Date for PT Re-Evaluation 02/22/15   Authorization Type BCBS 90 visit limit   PT Start Time 1448   PT Stop Time 1530   PT Time Calculation (min) 42 min      Past Medical History  Diagnosis Date  . Fibromyalgia   . Anemia   . Anxiety   . GERD (gastroesophageal reflux disease)   . Hyperlipidemia   . Hypertension   . Depression   . DDD (degenerative disc disease)   . Complication of anesthesia     last shoulder surg 2010-dsc-2 hr after block to surg-neck swelled-hard to tube-stayed RCC    Past Surgical History  Procedure Laterality Date  . Carpal tunnel release    . Shoulder surgery    . Tubal ligation    . Back surgery  89,02    lumbar x2  . Shoulder arthroscopy with subacromial decompression  10/23/2012    Procedure: SHOULDER ARTHROSCOPY WITH SUBACROMIAL DECOMPRESSION;  Surgeon: Wyn Forsterobert V Sypher Jr., MD;  Location: [facility];  Service: Orthopedics;  Laterality: Left;  LEFT SHOULDER  ARTHROSCOPY WITH SUBACROMIAL DECOMPRESSION, DISTAL CLAVICLE RESECTION, ARTHROSCOPIC SUBSCAPULARIS REPAIR AND OPEN REPAIR OF SUPRASPINATOUS TENDON    There were no vitals taken for this visit.  Visit Diagnosis:  Left-sided low back pain with left-sided sciatica  Difficulty walking  Lack of coordination      Subjective Assessment - 01/02/15 1452    Symptoms I'm no good at the balance stuff. I just fall forward or backward.   Currently in Pain? Yes   Pain Score 6   "My normal pain"   Pain Location Back   Pain Orientation Mid         Reviewed home exercise  programs (balance/neuro re ed and therex)see previously provided pt instructions for details-- with good performance. Minimal cues necessary. Therapist wrote in any additional needed cues onto pt's printed HEP.  Self care: Discussed benefit of utilizing a physioball for a chair to promote improved posture with improved core activation. Pt plans to attain a physioball for this.   other therex: anterior pelvic tilts with return to neutral on mat, then on ball for increased comfort, less than 3 minutes of sacro iliac joint distraction with medial pressure of iliums                   PT Short Term Goals - 12/30/14 1126    PT SHORT TERM GOAL #1   Title Demosntrate correct performance of HEP to address muscle weakness, pain, posture and balance impairment. Target 01/24/15   PT SHORT TERM GOAL #2   Title Complete Oswestry and write appropriate STG/LTG Target 01/24/15   PT SHORT TERM GOAL #3   Title Increase score on DGI to 19/24 Target 01/24/15   PT SHORT TERM GOAL #4   Title Report ability to tolerate sleeping in bed with modifications to postiion as needed for improved rest at night. Target 01/24/15   PT SHORT TERM GOAL #5   Title Demonstrate ability to sit >5 minutes without increase in pain. Target 01/24/15  PT Long Term Goals - 12/30/14 1126    PT LONG TERM GOAL #1   Title Verbalize understanding of posture/body mechanics for injury prevention. Target 02/22/15   PT LONG TERM GOAL #2   Title Verbalize understanding of fall prevention strategies for home environment.Target 02/22/15   PT LONG TERM GOAL #3   Title Increase DGI score to 22/24 for decreased fall risk. Target 02/22/15   PT LONG TERM GOAL #4   Title Oswestry Paper LTG: _____________Target 02/22/15   PT LONG TERM GOAL #5   Title Increase ABC score on FOTO to 53% for increased balance confidence. Target 02/22/15               Plan - 01/03/15 1456    Clinical Impression Statement Pt is making progress  with balance HEP. Now able to perform without UE support.   PT Next Visit Plan Stretch gluts/quads/IT band, strengthening glut med and anterior tibialis        Problem List Patient Active Problem List   Diagnosis Date Noted  . Left leg pain 11/22/2014  . Fibromyalgia 01/30/2012  . Depression with anxiety 01/30/2012  . Lumbar post-laminectomy syndrome 01/30/2012  . Thoracic or lumbosacral neuritis or radiculitis, unspecified 01/30/2012    [name] 01/03/2015, 3:00 PM  [facility] [address] [place], [place], [address] Phone: [phone]   Fax:  [phone]

## 2015-01-06 ENCOUNTER — Ambulatory Visit: Payer: BLUE CROSS/BLUE SHIELD | Attending: Neurology

## 2015-01-06 DIAGNOSIS — M5442 Lumbago with sciatica, left side: Secondary | ICD-10-CM

## 2015-01-06 DIAGNOSIS — R279 Unspecified lack of coordination: Secondary | ICD-10-CM

## 2015-01-06 DIAGNOSIS — R262 Difficulty in walking, not elsewhere classified: Secondary | ICD-10-CM

## 2015-01-06 NOTE — Therapy (Addendum)
[facility] [address] [place], [place], [address] Phone: [phone]   Fax:  [phone]  Physical Therapy Treatment  Patient Details  Name: Melanie Avila MRN: [id number] Date of Birth: 06/06/1961 Referring Provider:  Catalina PizzaHall, [name], MD  Encounter Date: 01/06/2015      PT End of Session - 01/06/15 1644    Visit Number 4   Number of Visits 17   Date for PT Re-Evaluation 02/22/15   Authorization Type [employer] 90 visit limit   PT Start Time 1447   PT Stop Time 1530   PT Time Calculation (min) 43 min      Past Medical History  Diagnosis Date  . Fibromyalgia   . Anemia   . Anxiety   . GERD (gastroesophageal reflux disease)   . Hyperlipidemia   . Hypertension   . Depression   . DDD (degenerative disc disease)   . Complication of anesthesia     last shoulder surg 2010-dsc-2 hr after block to surg-neck swelled-hard to tube-stayed RCC    Past Surgical History  Procedure Laterality Date  . Carpal tunnel release    . Shoulder surgery    . Tubal ligation    . Back surgery  89,02    lumbar x2  . Shoulder arthroscopy with subacromial decompression  10/23/2012    Procedure: SHOULDER ARTHROSCOPY WITH SUBACROMIAL DECOMPRESSION;  Surgeon: Wyn Forsterobert V Sypher Jr., MD;  Location: [facility];  Service: Orthopedics;  Laterality: Left;  LEFT SHOULDER  ARTHROSCOPY WITH SUBACROMIAL DECOMPRESSION, DISTAL CLAVICLE RESECTION, ARTHROSCOPIC SUBSCAPULARIS REPAIR AND OPEN REPAIR OF SUPRASPINATOUS TENDON    There were no vitals taken for this visit.  Visit Diagnosis:  Left-sided low back pain with left-sided sciatica  Difficulty walking  Lack of coordination      Subjective Assessment - 01/06/15 1456    Symptoms Pt is requesting that I send her PT notes to her disability insurance company. She will complete authorization form for sending this today.   Currently in Pain? Yes   Pain Score 7    Pain Location Back  left  leg and foot   Pain Type Chronic pain       self care: Thorough discussion of pt's reports about how she anticipates pain and feels that her entire body tenses up which leads to more pain. Therapist recognized pt's insightfulness and confirmed that this is a real contributor of chronic pain. Also discussed the role that stress and anxiety play in continuing the pain cycle. Pt was recommended to read "Mind Body Prescription" as this book can shine more light on the concepts of chronic pain and the role that the mind plays in this.Pt also discussed a constant itching/burning sensation she gets in the middle of her right palm when she is experiencing stressful life events. She stated that shehas not been given any answers regarding this. PT suggested pt pursue counseling or psychology to address some of the early adulthood trauma she mentioned--which seems to have occurred just before the start of her chronic pain.  Therex: Bilateral knee to chest stretch Taught IT band protocol--see HEP Lifting and lowering the LLE on/off bed with active core                     PT Education - 01/06/15 1644    Education provided Yes   Education Details HEP for IT band stretching and book recommendation   Person(s) Educated Patient   Methods Explanation   Comprehension  Verbalized understanding;Returned demonstration          PT Short Term Goals - 01/06/15 1655    PT SHORT TERM GOAL #1   Title Demosntrate correct performance of HEP to address muscle weakness, pain, posture and balance impairment. Target 01/24/15   PT SHORT TERM GOAL #2   Title Decrease Oswestry pain disability questionaire to 18 = 36% disability (scored 44% initially) Target 01/24/15   PT SHORT TERM GOAL #3   Title Increase score on DGI to 19/24 Target 01/24/15   PT SHORT TERM GOAL #4   Title Report ability to tolerate sleeping in bed with modifications to postiion as needed for improved rest at night. Target 01/24/15   PT  SHORT TERM GOAL #5   Title Demonstrate ability to sit >5 minutes without increase in pain. Target 01/24/15           PT Long Term Goals - 01/06/15 1657    PT LONG TERM GOAL #1   Title Verbalize understanding of posture/body mechanics for injury prevention. Target 02/22/15   PT LONG TERM GOAL #2   Title Verbalize understanding of fall prevention strategies for home environment.Target 02/22/15   PT LONG TERM GOAL #3   Title Increase DGI score to 22/24 for decreased fall risk. Target 02/22/15   PT LONG TERM GOAL #4   Title Oswestry Paper LTG: Decrease score to 14 points = 28% disability (was 44% initially)Target 02/22/15   PT LONG TERM GOAL #5   Title Increase ABC score on FOTO to 53% for increased balance confidence. Target 02/22/15               Plan - 01/06/15 1645    Clinical Impression Statement Pt has pain in left hip consistent with IT band pain. She responded vvery well to IT band exercise protocol today.    PT Next Visit Plan Strengthen anterior tibialis, ensure the therapy notes were faxed to [facility] as per pt request, thoracic and lumbar stretching and core strength        Problem List Patient Active Problem List   Diagnosis Date Noted  . Left leg pain 11/22/2014  . Fibromyalgia 01/30/2012  . Depression with anxiety 01/30/2012  . Lumbar post-laminectomy syndrome 01/30/2012  . Thoracic or lumbosacral neuritis or radiculitis, unspecified 01/30/2012    [name] 01/06/2015, 4:58 PM  [facility] [address] [place], [place], [address] Phone: [phone]   Fax:  [phone]

## 2015-01-06 NOTE — Patient Instructions (Addendum)
Clam Shell 45 Degrees   Lying on your side with a 1/4th forward roll with hips and knees bent 45. Lift knee 3". Be sure pelvis does not roll backward. Do not arch back. Do 3 sets of 10 reps, each leg, every other day.  http://ss.exer.us/75   Copyright  VHI. All rights reserved.    IT Band: Wall Lean With Crossed Leg   Stand with left hand on wall (or hold onto a chair). Cross left leg behind other leg. Stretch hip toward wall with other arm supporting trunk. Hold 30 seconds. Relax. Repeat 3 times each leg. Do 2 sets per  Day. (morning and night) Repeat on other side.  Copyright  VHI. All rights reserved.   Hip Flexor Stretch   Lying on back near edge of bed, bend one leg, foot flat. Hang other leg over edge, relaxed, bend knee until a gentle stretch is felt. Keep foot supported with a stack of books or the floor. Hold 30 seconds.  Repeat 3 times on each leg. Do 2 sessions per day (morning and night). Advanced Exercise: Bend knee back keeping thigh in contact with bed.  http://gt2.exer.us/347   Copyright  VHI. All rights reserved.  Gluteal Stretch--don't use a ball    Lie supine, foot flat on bed and, other ankle crossed over knee. Push your knee away from your body until a gentle stretch is felt. Peform 3x30 seconds on each leg morning and night. Copyright  VHI. All rights reserved.   Recommended pt read Mind Body Prescription by Dr. Marsh DollyJohn E Sarno. As it is very educational regarding psychosomatic aspect of chronic pain.

## 2015-01-09 ENCOUNTER — Encounter: Payer: Self-pay | Admitting: Neurology

## 2015-01-09 ENCOUNTER — Ambulatory Visit: Payer: BLUE CROSS/BLUE SHIELD

## 2015-01-09 ENCOUNTER — Ambulatory Visit (INDEPENDENT_AMBULATORY_CARE_PROVIDER_SITE_OTHER): Payer: BLUE CROSS/BLUE SHIELD | Admitting: Neurology

## 2015-01-09 VITALS — BP 127/82 | HR 94 | Ht 67.0 in | Wt 186.5 lb

## 2015-01-09 DIAGNOSIS — M5442 Lumbago with sciatica, left side: Secondary | ICD-10-CM

## 2015-01-09 DIAGNOSIS — R279 Unspecified lack of coordination: Secondary | ICD-10-CM

## 2015-01-09 DIAGNOSIS — M5417 Radiculopathy, lumbosacral region: Secondary | ICD-10-CM

## 2015-01-09 DIAGNOSIS — R262 Difficulty in walking, not elsewhere classified: Secondary | ICD-10-CM

## 2015-01-09 NOTE — Therapy (Signed)
[facility] [address] [place], [place], [address] Phone: [phone]   Fax:  [phone]  Physical Therapy Treatment  Patient Details  Name: Melanie Avila MRN: [id number] Date of Birth: 01/30/1961 Referring Provider:  Catalina PizzaHall, [name], MD  Encounter Date: 01/09/2015      PT End of Session - 01/09/15 1618    Visit Number 5   Number of Visits 17   Date for PT Re-Evaluation 02/22/15   Authorization Type [employer]BS 90 visit limit   PT Start Time 1445   PT Stop Time 1530   PT Time Calculation (min) 45 min      Past Medical History  Diagnosis Date  . Fibromyalgia   . Anemia   . Anxiety   . GERD (gastroesophageal reflux disease)   . Hyperlipidemia   . Hypertension   . Depression   . DDD (degenerative disc disease)   . Complication of anesthesia     last shoulder surg 2010-dsc-2 hr after block to surg-neck swelled-hard to tube-stayed RCC    Past Surgical History  Procedure Laterality Date  . Carpal tunnel release    . Shoulder surgery    . Tubal ligation    . Back surgery  89,02    lumbar x2  . Shoulder arthroscopy with subacromial decompression  10/23/2012    Procedure: SHOULDER ARTHROSCOPY WITH SUBACROMIAL DECOMPRESSION;  Surgeon: Wyn Forsterobert V Sypher Jr., MD;  Location: [facility];  Service: Orthopedics;  Laterality: Left;  LEFT SHOULDER  ARTHROSCOPY WITH SUBACROMIAL DECOMPRESSION, DISTAL CLAVICLE RESECTION, ARTHROSCOPIC SUBSCAPULARIS REPAIR AND OPEN REPAIR OF SUPRASPINATOUS TENDON    There were no vitals taken for this visit.  Visit Diagnosis:  Left-sided low back pain with left-sided sciatica  Difficulty walking  Lack of coordination      Subjective Assessment - 01/09/15 1455    Symptoms Pt reports she is noticing a notable improvement in her balance but less improvment in the pain relief. She has relief while in therapy but it does not remain.   Currently in Pain? Yes  had to take 2 pain pills,  muscle relaxer and anti-inflammatory   Pain Score 7    Pain Location Back   Pain Orientation Upper;Lower     Therex:  3x30 seconds on each leg anterior tibialis stretch 3x30 seconds each side:thoracic rotation and thoracic sidebending in siting, lumbar rotation with therapist providing overpressure,   10x bridges Bridge hold +10 marches 10x scissor kicks in forearm prop 10x flutter kicks in forearm prop 5x chest press up with hold 10x each leg glut kickbacks                         PT Short Term Goals - 01/06/15 1655    PT SHORT TERM GOAL #1   Title Demosntrate correct performance of HEP to address muscle weakness, pain, posture and balance impairment. Target 01/24/15   PT SHORT TERM GOAL #2   Title Decrease Oswestry pain disability questionaire to 18 = 36% disability (scored 44% initially) Target 01/24/15   PT SHORT TERM GOAL #3   Title Increase score on DGI to 19/24 Target 01/24/15   PT SHORT TERM GOAL #4   Title Report ability to tolerate sleeping in bed with modifications to postiion as needed for improved rest at night. Target 01/24/15   PT SHORT TERM GOAL #5   Title Demonstrate ability to sit >5 minutes without increase in pain. Target 01/24/15  PT Long Term Goals - 01/06/15 1657    PT LONG TERM GOAL #1   Title Verbalize understanding of posture/body mechanics for injury prevention. Target 02/22/15   PT LONG TERM GOAL #2   Title Verbalize understanding of fall prevention strategies for home environment.Target 02/22/15   PT LONG TERM GOAL #3   Title Increase DGI score to 22/24 for decreased fall risk. Target 02/22/15   PT LONG TERM GOAL #4   Title Oswestry Paper LTG: Decrease score to 14 points = 28% disability (was 44% initially)Target 02/22/15   PT LONG TERM GOAL #5   Title Increase ABC score on FOTO to 53% for increased balance confidence. Target 02/22/15               Plan - 01/09/15 1619    Clinical Impression Statement Pt is  making progress with awareness of how posture and stress affect her muscle tension and her pain. Continue per plan of care.   PT Next Visit Plan Balance on compliant surfaces        Problem List Patient Active Problem List   Diagnosis Date Noted  . Left leg pain 11/22/2014  . Fibromyalgia 01/30/2012  . Depression with anxiety 01/30/2012  . Lumbar post-laminectomy syndrome 01/30/2012  . Thoracic or lumbosacral neuritis or radiculitis, unspecified 01/30/2012    [name] 01/09/2015, 4:20 PM  [facility] [address] [place], [place], [address] Phone: [phone]   Fax:  [phone]

## 2015-01-09 NOTE — Progress Notes (Signed)
[facility]    Provider:  Dr Lucia GaskinsAhern Referring Provider: Catalina PizzaHall, [name], MD Primary Care Physician:  Catalina PizzaHALL, [name], MD  CC:  Left leg pain  HPI:  Melanie Avila is a 54 y.o. female here as a follow up for left leg pain  She is in physical therapy. Working on balance and strength. PT is helping. Pain is still horrible. She has been on pain medication for 10 years. She has tried steroids in the past which help for a few weeks but never permanently improve or resolve the problem. She has had 2 back surgeries. It helped at the time. The pain has returned. She takes chronic Norco for the pain. She takes up to 5 daily. Leg has not given out recently but  she feels weak esp if she has to sit for long periods.  She is due to complete physical therapt at the end of march.   Reviewed notes, labs and imaging from outside physicians, which showed:  MRI of the lumbar spine 12/04/2014  Abnormal MRI lumbar spine (without) demonstrating: 1. At L5-S1: left laminectomy, disc bulging, with mild right and moderate-severe left foraminal stenosis; potential impingement upon the exiting left L5 and descending left S1 roots 2. Multi-level facet hypertrophy. 3. Compared to MRI on 07/22/11, no major changes, but possibly subtle increased contact upon the left L5 and S1 roots in the current study.  Review of Systems: Patient complains of symptoms per HPI as well as the following symptoms: fatigue, numbness, murmur, apnea, insomnia, restless leg, snoring, muscle cramps, aching muscles, weakness, numbness, depression and anxiety. Pertinent negatives per HPI. All others negative.  Initial visit 11/19/2014: She is having knee pain. She has chronic LBP and hx of 2 back surgeries, fibromyalgia. She has chronic weakness in the toes attributed to lumbar radiculopathy. She was at work in early November and she fell on the kitchen floor, landed on her right hip and right elbow. Her left knee twisted and she  damaged it. Since then the knee has been hurting. She hasn't fallen since then. It still hurts, with stiffness in the knee. She has chronic LBP with radiation down the back of the left leg. She has foot pain around the left foot ankle. The toes have numbness and tingling. Right foot sometimes gets numb but it is positional. She can just be walking and the left leg gives out. She feels weakness in her left leg, more weakness. She has fallen 3 times due to this. She also fell in the driveway, about 3x times since November for left leg giving out. No other focal neurologic symptoms.    History   Social History  . Marital Status: Married    Spouse Name: Randi     Number of Children: 2  . Years of Education: 12   Occupational History  . Not on file.   Social History Main Topics  . Smoking status: Current Every Day Smoker -- 1.00 packs/day for 35 years    Types: Cigarettes  . Smokeless tobacco: Never Used  . Alcohol Use: No  . Drug Use: No  . Sexual Activity: Not on file   Other Topics Concern  . Not on file   Social History Narrative   Patient lives at home with husband and son   Patient has 2 children    Patient has a high school education    Patient works has [employer]   Patient is right handed     Family History  Problem Relation Age of Onset  . Alzheimer's disease Mother   . Prostate cancer Father   . Leukemia Father   . Heart attack Maternal Aunt   . Heart attack Maternal Uncle     Past Medical History  Diagnosis Date  . Fibromyalgia   . Anemia   . Anxiety   . GERD (gastroesophageal reflux disease)   . Hyperlipidemia   . Hypertension   . Depression   . DDD (degenerative disc disease)   . Complication of anesthesia     last shoulder surg 2010-dsc-2 hr after block to surg-neck swelled-hard to tube-stayed RCC    Past Surgical History  Procedure Laterality Date  . Carpal tunnel release    . Shoulder surgery    . Tubal ligation    . Back surgery  89,02    lumbar  x2  . Shoulder arthroscopy with subacromial decompression  10/23/2012    Procedure: SHOULDER ARTHROSCOPY WITH SUBACROMIAL DECOMPRESSION;  Surgeon: Wyn Forster., MD;  Location: [facility];  Service: Orthopedics;  Laterality: Left;  LEFT SHOULDER  ARTHROSCOPY WITH SUBACROMIAL DECOMPRESSION, DISTAL CLAVICLE RESECTION, ARTHROSCOPIC SUBSCAPULARIS REPAIR AND OPEN REPAIR OF SUPRASPINATOUS TENDON    Current Outpatient Prescriptions  Medication Sig Dispense Refill  . ALPRAZolam (XANAX) 1 MG tablet Take 1 mg by mouth 2 (two) times daily as needed. 1 TO 1 1/2 QHS    . atorvastatin (LIPITOR) 20 MG tablet 20 mg daily.    . carisoprodol (SOMA) 350 MG tablet Take 1 tablet (350 mg total) by mouth every 8 (eight) hours as needed for muscle spasms. 75 tablet 3  . diclofenac (VOLTAREN) 75 MG EC tablet Take 75 mg by mouth 2 (two) times daily.    Marland Kitchen estradiol (ESTRACE) 2 MG tablet TAKE 1 TABLET DAILY 90 tablet 2  . HYDROcodone-acetaminophen (NORCO) 10-325 MG per tablet Take by mouth daily.     . medroxyPROGESTERone (PROVERA) 2.5 MG tablet     . NUCYNTA ER 50 MG TB12     . omeprazole (PRILOSEC) 20 MG capsule Take 20 mg by mouth as needed.     . sertraline (ZOLOFT) 100 MG tablet     . telmisartan-hydrochlorothiazide (MICARDIS HCT) 80-25 MG per tablet Take 1 tablet by mouth daily.    Marland Kitchen oxymorphone (OPANA ER) 30 MG 12 hr tablet Take 1 tablet (30 mg total) by mouth every 12 (twelve) hours. (Patient not taking: Reported on 01/09/2015) 60 tablet 0   No current facility-administered medications for this visit.    Allergies as of 01/09/2015 - Review Complete 01/09/2015  Allergen Reaction Noted  . Cymbalta [duloxetine hcl]  01/05/2012  . Sulfa antibiotics Swelling 09/29/2011  . Effexor [venlafaxine hydrochloride]  01/05/2012    Vitals: BP 127/82 mmHg  Pulse 94  Ht  (1.702 m)  Wt 186 lb 8 oz (84.596 kg)  BMI 29.20 kg/m2 Last Weight:  Wt Readings from Last 1 Encounters:  01/09/15 186 lb  8 oz (84.596 kg)   Last Height:   Ht Readings from Last 1 Encounters:  01/09/15  (1.702 m)   Physical exam: Exam: Gen: NAD, conversant, well nourised, well groomed    Eyes: Conjunctivae clear without exudates or hemorrhage  Neuro: Detailed Neurologic Exam  Speech:    Speech is normal; fluent and spontaneous with normal comprehension.  Cognition:    The patient is oriented to person, place, and time;     recent and remote memory intact;     language fluent;  normal attention, concentration,     fund of knowledge Cranial Nerves:    The pupils are equal, round, and reactive to light. Visual fields are full to finger confrontation. Extraocular movements are intact. Trigeminal sensation is intact and the muscles of mastication are normal. The face is symmetric. The palate elevates in the midline. Hearing intact. Voice is normal. Shoulder shrug is normal. The tongue has normal motion without fasciculations.   Gait:    Heel-toe and tandem gait are normal.   Motor Observation:    No asymmetry, no atrophy, and no involuntary movements noted. Tone:    Normal muscle tone.    Posture:    Posture is normal.     Strength: Still some weakness left HS and hip flexion but improved from last exam  now 5-/5.     Sensation: Left lateral foot and leg numbness   DTR: absent left ankle DTR      Assessment/Plan:  54 year old female PMHx chronic pain and Fibromyalgia, depression, anxiety with chronic LBP and hx of 2 back surgeries, fibromyalgia with left leg weakness, numbness, radicular pain after a fall. MRI shows impingement on the left L5 and S1 roots.  Continue physical therapy, will re-evaluate at the end of March after this round of PT is complete.    [name], MD  [facility] [address] [place], [place] [address]  Phone [phone] Fax [phone]  A total of 20 minutes was spent face-to-face with this patient. Over half this  time was spent on counseling patient on the lumbar radiculopathy diagnosis and different diagnostic and therapeutic options available.

## 2015-01-10 ENCOUNTER — Encounter: Payer: Self-pay | Admitting: Neurology

## 2015-01-12 ENCOUNTER — Encounter: Payer: Self-pay | Admitting: *Deleted

## 2015-01-12 ENCOUNTER — Telehealth: Payer: Self-pay | Admitting: *Deleted

## 2015-01-12 NOTE — Progress Notes (Signed)
This encounter was created in error - please disregard.

## 2015-01-12 NOTE — Telephone Encounter (Signed)
Form faxed on 01/22/15 to [employer] attn Clent JacksKathy Wall RN BSN.

## 2015-01-12 NOTE — Addendum Note (Signed)
Addended by: Hillis RangeKING, Rivers Hamrick L on: 01/12/2015 03:45 PM   Modules accepted: Level of Service, SmartSet

## 2015-01-13 ENCOUNTER — Ambulatory Visit: Payer: BLUE CROSS/BLUE SHIELD

## 2015-01-13 DIAGNOSIS — M5442 Lumbago with sciatica, left side: Secondary | ICD-10-CM | POA: Diagnosis not present

## 2015-01-13 DIAGNOSIS — R262 Difficulty in walking, not elsewhere classified: Secondary | ICD-10-CM

## 2015-01-13 DIAGNOSIS — R279 Unspecified lack of coordination: Secondary | ICD-10-CM

## 2015-01-13 NOTE — Patient Instructions (Signed)
Ankle: Plantar Flexion   Gently push foot down without turning it inward or outward. Hold 30 seconds. Repeat 3 times. Do 2 sessions per day on each leg. CAUTION: Stretch should be gentle, steady and slow.  Copyright  VHI. All rights reserved.

## 2015-01-13 NOTE — Therapy (Signed)
[facility] [address] [place], [place], [address] Phone: [phone]   Fax:  [phone]  Physical Therapy Treatment  Patient Details  Name: Melanie Avila MRN: [id number] Date of Birth: 10/16/1961 Referring Provider:  Catalina PizzaHall, [name], MD  Encounter Date: 01/13/2015      PT End of Session - 01/13/15 1452    Visit Number 6   Number of Visits 17   Date for PT Re-Evaluation 02/22/15   Authorization Type BCBS 90 visit limit      Past Medical History  Diagnosis Date  . Fibromyalgia   . Anemia   . Anxiety   . GERD (gastroesophageal reflux disease)   . Hyperlipidemia   . Hypertension   . Depression   . DDD (degenerative disc disease)   . Complication of anesthesia     last shoulder surg 2010-dsc-2 hr after block to surg-neck swelled-hard to tube-stayed RCC    Past Surgical History  Procedure Laterality Date  . Carpal tunnel release    . Shoulder surgery    . Tubal ligation    . Back surgery  89,02    lumbar x2  . Shoulder arthroscopy with subacromial decompression  10/23/2012    Procedure: SHOULDER ARTHROSCOPY WITH SUBACROMIAL DECOMPRESSION;  Surgeon: Wyn Forsterobert V Sypher Jr., MD;  Location: [facility];  Service: Orthopedics;  Laterality: Left;  LEFT SHOULDER  ARTHROSCOPY WITH SUBACROMIAL DECOMPRESSION, DISTAL CLAVICLE RESECTION, ARTHROSCOPIC SUBSCAPULARIS REPAIR AND OPEN REPAIR OF SUPRASPINATOUS TENDON    There were no vitals taken for this visit.  Visit Diagnosis:  No diagnosis found.      Subjective Assessment - 01/13/15 1450    Symptoms I've been reading that book; 75% done and I believe what it says! That doctor is a genius!   Currently in Pain? Yes   Pain Score 5    Pain Location Back  but its getting better       Therex: Discussed going to the gym and how pt's misses doing this. In the past, she used the ellptical and treadmill and some of the weight lifting machines. PT recommended  return to gym once we trial the treadmill and elliptical during therapy. PT recommended against using the weight lifting machines at this time. Pt completed 5 minutes of walking for endurance on treadmill with BUE support 1st minute at 2.0 MPH and 4 minutes at 2.5 MPH with no sign of foot drop.  4 laps on compliant surface each: (with pt following instructions to use BUE to facilitate glut activation and core activation) -Tandem walking -braiding -high knee marching with 2 second holds forward then backward -heel walking   *Then repeated all of the above with 4 more laps each but with eyes closed and fingertip support.                         PT Short Term Goals - 01/06/15 1655    PT SHORT TERM GOAL #1   Title Demosntrate correct performance of HEP to address muscle weakness, pain, posture and balance impairment. Target 01/24/15   PT SHORT TERM GOAL #2   Title Decrease Oswestry pain disability questionaire to 18 = 36% disability (scored 44% initially) Target 01/24/15   PT SHORT TERM GOAL #3   Title Increase score on DGI to 19/24 Target 01/24/15   PT SHORT TERM GOAL #4   Title Report ability to tolerate sleeping in bed with modifications to postiion as needed for improved  rest at night. Target 01/24/15   PT SHORT TERM GOAL #5   Title Demonstrate ability to sit >5 minutes without increase in pain. Target 01/24/15           PT Long Term Goals - 01/06/15 1657    PT LONG TERM GOAL #1   Title Verbalize understanding of posture/body mechanics for injury prevention. Target 02/22/15   PT LONG TERM GOAL #2   Title Verbalize understanding of fall prevention strategies for home environment.Target 02/22/15   PT LONG TERM GOAL #3   Title Increase DGI score to 22/24 for decreased fall risk. Target 02/22/15   PT LONG TERM GOAL #4   Title Oswestry Paper LTG: Decrease score to 14 points = 28% disability (was 44% initially)Target 02/22/15   PT LONG TERM GOAL #5   Title Increase ABC  score on FOTO to 53% for increased balance confidence. Target 02/22/15               Problem List Patient Active Problem List   Diagnosis Date Noted  . Left leg pain 11/22/2014  . Fibromyalgia 01/30/2012  . Depression with anxiety 01/30/2012  . Lumbar post-laminectomy syndrome 01/30/2012  . Thoracic or lumbosacral neuritis or radiculitis, unspecified 01/30/2012   Lamar Laundry, PT,DPT,NCS 01/13/2015 4:31 PM Phone [phone] FAX [phone]         [facility] [address] [place], [place], [address] Phone: [phone]   Fax:  [phone]

## 2015-01-16 ENCOUNTER — Ambulatory Visit: Payer: BLUE CROSS/BLUE SHIELD

## 2015-01-16 DIAGNOSIS — R279 Unspecified lack of coordination: Secondary | ICD-10-CM

## 2015-01-16 DIAGNOSIS — M5442 Lumbago with sciatica, left side: Secondary | ICD-10-CM | POA: Diagnosis not present

## 2015-01-16 DIAGNOSIS — R262 Difficulty in walking, not elsewhere classified: Secondary | ICD-10-CM

## 2015-01-16 NOTE — Therapy (Signed)
[facility] [address] [place], [place], [address] Phone: [phone]   Fax:  [phone]  Physical Therapy Treatment  Patient Details  Name: Melanie Avila MRN: [id number] Date of Birth: February 11, 1961 Referring Provider:  Catalina Pizza, MD  Encounter Date: 01/16/2015      PT End of Session - 01/16/15 1530    Visit Number 7   Number of Visits 17   Date for PT Re-Evaluation 02/22/15   Authorization Type BCBS 90 visit limit   PT Start Time 1450   PT Stop Time 1529   PT Time Calculation (min) 39 min      Past Medical History  Diagnosis Date  . Fibromyalgia   . Anemia   . Anxiety   . GERD (gastroesophageal reflux disease)   . Hyperlipidemia   . Hypertension   . Depression   . DDD (degenerative disc disease)   . Complication of anesthesia     last shoulder surg 2010-dsc-2 hr after block to surg-neck swelled-hard to tube-stayed RCC    Past Surgical History  Procedure Laterality Date  . Carpal tunnel release    . Shoulder surgery    . Tubal ligation    . Back surgery  89,02    lumbar x2  . Shoulder arthroscopy with subacromial decompression  10/23/2012    Procedure: SHOULDER ARTHROSCOPY WITH SUBACROMIAL DECOMPRESSION;  Surgeon: Wyn Forster., MD;  Location: [facility];  Service: Orthopedics;  Laterality: Left;  LEFT SHOULDER  ARTHROSCOPY WITH SUBACROMIAL DECOMPRESSION, DISTAL CLAVICLE RESECTION, ARTHROSCOPIC SUBSCAPULARIS REPAIR AND OPEN REPAIR OF SUPRASPINATOUS TENDON    There were no vitals taken for this visit.  Visit Diagnosis:  Left-sided low back pain with left-sided sciatica  Difficulty walking  Lack of coordination      Subjective Assessment - 01/16/15 1452    Symptoms reports a decrease in pain   Currently in Pain? Yes   Pain Score 4   it's hardly bothering me today   Pain Location Back      Star drill on compliant mat 8x 4 points each leg with intermittent UE support;  progressed to perform on airex on top of compliant mat 5x4 points each leg.  Tandem stance trials on airex+compliant mat with verbal cues to slowly recorrect balance with weight shift from the core rather than using BUE to push herself over; progressed to tandem stance on airex with around the world ball toss without UE support  Tandem walking on compliant balance beam with intermittent fingertip support; lateral walking with eyes closed with intermittent fingertip support, then progressed to add head nods with eyes closed, then progressed to remove UE support with therapist providing intermittent CGA.  Inverted BOSU ball: anterior posterior limits of stability for ankle reaction; 10x squats with opposite BUE motion for refining hip strategy  Therex x2.5 minutes Elliptical 2.5 minutes at level 2.5 with pt requesting to stop prior to the 5 minute target due to fatigue. Will progress this in future sessions.                          PT Short Term Goals - 01/06/15 1655    PT SHORT TERM GOAL #1   Title Demosntrate correct performance of HEP to address muscle weakness, pain, posture and balance impairment. Target 01/24/15   PT SHORT TERM GOAL #2   Title Decrease Oswestry pain disability questionaire to 18 = 36% disability (scored 44% initially) Target 01/24/15  PT SHORT TERM GOAL #3   Title Increase score on DGI to 19/24 Target 01/24/15   PT SHORT TERM GOAL #4   Title Report ability to tolerate sleeping in bed with modifications to postiion as needed for improved rest at night. Target 01/24/15   PT SHORT TERM GOAL #5   Title Demonstrate ability to sit >5 minutes without increase in pain. Target 01/24/15           PT Long Term Goals - 01/06/15 1657    PT LONG TERM GOAL #1   Title Verbalize understanding of posture/body mechanics for injury prevention. Target 02/22/15   PT LONG TERM GOAL #2   Title Verbalize understanding of fall prevention strategies for home  environment.Target 02/22/15   PT LONG TERM GOAL #3   Title Increase DGI score to 22/24 for decreased fall risk. Target 02/22/15   PT LONG TERM GOAL #4   Title Oswestry Paper LTG: Decrease score to 14 points = 28% disability (was 44% initially)Target 02/22/15   PT LONG TERM GOAL #5   Title Increase ABC score on FOTO to 53% for increased balance confidence. Target 02/22/15               Plan - 01/16/15 1531    Clinical Impression Statement Pt is making significant progress with balance. Will need continued work towards endurance    PT Next Visit Plan *Add visits and Begin checking goals*; elliptical level 2.5 x4 minutes; core strengthening; address pain        Problem List Patient Active Problem List   Diagnosis Date Noted  . Left leg pain 11/22/2014  . Fibromyalgia 01/30/2012  . Depression with anxiety 01/30/2012  . Lumbar post-laminectomy syndrome 01/30/2012  . Thoracic or lumbosacral neuritis or radiculitis, unspecified 01/30/2012   Lamar LaundryJennifer Myca Perno, PT,DPT,NCS 01/16/2015 3:33 PM Phone [phone] FAX [phone]         [facility] [address] [place], [place], [address] Phone: [phone]   Fax:  [phone]

## 2015-01-21 ENCOUNTER — Ambulatory Visit: Payer: BLUE CROSS/BLUE SHIELD

## 2015-01-21 DIAGNOSIS — M5442 Lumbago with sciatica, left side: Secondary | ICD-10-CM | POA: Diagnosis not present

## 2015-01-21 DIAGNOSIS — R262 Difficulty in walking, not elsewhere classified: Secondary | ICD-10-CM

## 2015-01-21 DIAGNOSIS — R279 Unspecified lack of coordination: Secondary | ICD-10-CM

## 2015-01-21 NOTE — Therapy (Signed)
[facility] [address] [place], [place], [address] Phone: [phone]   Fax:  [phone]  Physical Therapy Treatment  Patient Details  Name: Melanie Avila MRN: [id number] Date of Birth: [birth year] Referring Provider:  Catalina Pizza, MD  Encounter Date: 01/21/2015      PT End of Session - 01/21/15 1718    Visit Number 8   Number of Visits 17   Date for PT Re-Evaluation 02/22/15   Authorization Type BCBS 90 visit limit   PT Start Time 1445   PT Stop Time 1530   PT Time Calculation (min) 45 min      Past Medical History  Diagnosis Date  . Fibromyalgia   . Anemia   . Anxiety   . GERD (gastroesophageal reflux disease)   . Hyperlipidemia   . Hypertension   . Depression   . DDD (degenerative disc disease)   . Complication of anesthesia     last shoulder surg 2010-dsc-2 hr after block to surg-neck swelled-hard to tube-stayed RCC    Past Surgical History  Procedure Laterality Date  . Carpal tunnel release    . Shoulder surgery    . Tubal ligation    . Back surgery  89,02    lumbar x2  . Shoulder arthroscopy with subacromial decompression  10/23/2012    Procedure: SHOULDER ARTHROSCOPY WITH SUBACROMIAL DECOMPRESSION;  Surgeon: Wyn Forster., MD;  Location: [facility];  Service: Orthopedics;  Laterality: Left;  LEFT SHOULDER  ARTHROSCOPY WITH SUBACROMIAL DECOMPRESSION, DISTAL CLAVICLE RESECTION, ARTHROSCOPIC SUBSCAPULARIS REPAIR AND OPEN REPAIR OF SUPRASPINATOUS TENDON    There were no vitals taken for this visit.  Visit Diagnosis:  Left-sided low back pain with left-sided sciatica  Difficulty walking  Lack of coordination      Subjective Assessment - 01/21/15 1458    Symptoms reports a decrease in pain   Currently in Pain? No/denies      Checked short term goals--see goals sections  Bilateral knee to chest 3x30 seconds Lumbar rocking 3x10 Prone bilateral simultaneous glut  kickback with gluteus medius isolation 3x10 bridging  Unable to tolerate mobilization of pelvis into anterior pelvic tilt in prone     [facility] PT Assessment - 01/21/15 0001    Observation/Other Assessments   Other Surveys  Select   Oswestry Disability Index  18 points = 36%   Dynamic Gait Index   Level Surface Mild Impairment   Change in Gait Speed Mild Impairment   Gait with Horizontal Head Turns Normal   Gait with Vertical Head Turns Normal   Gait and Pivot Turn Normal   Step Over Obstacle Normal   Step Around Obstacles Normal   Steps Normal   Total Score 22                            PT Short Term Goals - 01/21/15 1453    PT SHORT TERM GOAL #1   Title Demosntrate correct performance of HEP to address muscle weakness, pain, posture and balance impairment. Target 01/24/15   PT SHORT TERM GOAL #2   Title Decrease Oswestry pain disability questionaire to 18 = 36% disability (scored 44% initially) Target 01/24/15   Status Achieved   PT SHORT TERM GOAL #3   Title Increase score on DGI to 19/24 Target 01/24/15   Status Achieved  22/24 on 01/21/15   PT SHORT TERM GOAL #4   Title Report ability to tolerate  sleeping in bed with modifications to postiion as needed for improved rest at night. Target 01/24/15   PT SHORT TERM GOAL #5   Title Demonstrate ability to sit >5 minutes without increase in pain. Target 01/24/15   Status Not Met  reports increased paresthesias after sitting 5 minutes on 01/21/14           PT Long Term Goals - 01/06/15 1657    PT LONG TERM GOAL #1   Title Verbalize understanding of posture/body mechanics for injury prevention. Target 02/22/15   PT LONG TERM GOAL #2   Title Verbalize understanding of fall prevention strategies for home environment.Target 02/22/15   PT LONG TERM GOAL #3   Title Increase DGI score to 22/24 for decreased fall risk. Target 02/22/15   PT LONG TERM GOAL #4   Title Oswestry Paper LTG: Decrease score to 14 points = 28%  disability (was 44% initially)Target 02/22/15   PT LONG TERM GOAL #5   Title Increase ABC score on FOTO to 53% for increased balance confidence. Target 02/22/15               Plan - 01/21/15 1719    Clinical Impression Statement Pt met balance goals. Continues to have pain which limits ability to sit for prolonged periods and limits ability to sleep comfortably. Continue per plan of care with pt requesting to take off 2 weeks while therapist is out of town. She was given the option of seeing a different therpayst.    PT Next Visit Plan Check/upgrade HEP; trial foot up brace, elliptical level 2.5x4 minutes        Problem List Patient Active Problem List   Diagnosis Date Noted  . Left leg pain 11/22/2014  . Fibromyalgia 01/30/2012  . Depression with anxiety 01/30/2012  . Lumbar post-laminectomy syndrome 01/30/2012  . Thoracic or lumbosacral neuritis or radiculitis, unspecified 01/30/2012    Delrae Sawyers D 01/21/2015, 5:21 PM  [facility] [address] [place], [place], [address] Phone: [phone]   Fax:  [phone]

## 2015-01-23 ENCOUNTER — Ambulatory Visit: Payer: BLUE CROSS/BLUE SHIELD

## 2015-02-11 ENCOUNTER — Ambulatory Visit: Payer: BLUE CROSS/BLUE SHIELD | Attending: Neurology

## 2015-02-11 DIAGNOSIS — R262 Difficulty in walking, not elsewhere classified: Secondary | ICD-10-CM | POA: Diagnosis not present

## 2015-02-11 DIAGNOSIS — M5442 Lumbago with sciatica, left side: Secondary | ICD-10-CM | POA: Diagnosis present

## 2015-02-11 DIAGNOSIS — R279 Unspecified lack of coordination: Secondary | ICD-10-CM | POA: Diagnosis not present

## 2015-02-11 NOTE — Therapy (Signed)
[facility] [address] [place], [place], [address] Phone: [phone]   Fax:  [phone]  Physical Therapy Treatment  Patient Details  Name: Melanie Avila MRN: [id number] Date of Birth: 1961/09/01 Referring Provider:  Delphina Cahill, MD  Encounter Date: 02/11/2015      PT End of Session - 02/11/15 1620    Visit Number 9   Number of Visits 17   Date for PT Re-Evaluation 02/22/15   Authorization Type BCBS 90 visit limit   PT Start Time 1534   PT Stop Time 1615   PT Time Calculation (min) 41 min      Past Medical History  Diagnosis Date  . Fibromyalgia   . Anemia   . Anxiety   . GERD (gastroesophageal reflux disease)   . Hyperlipidemia   . Hypertension   . Depression   . DDD (degenerative disc disease)   . Complication of anesthesia     last shoulder surg 2010-dsc-2 hr after block to surg-neck swelled-hard to tube-stayed RCC    Past Surgical History  Procedure Laterality Date  . Carpal tunnel release    . Shoulder surgery    . Tubal ligation    . Back surgery  89,02    lumbar x2  . Shoulder arthroscopy with subacromial decompression  10/23/2012    Procedure: SHOULDER ARTHROSCOPY WITH SUBACROMIAL DECOMPRESSION;  Surgeon: Cammie Sickle., MD;  Location: [facility];  Service: Orthopedics;  Laterality: Left;  LEFT SHOULDER  ARTHROSCOPY WITH SUBACROMIAL DECOMPRESSION, DISTAL CLAVICLE RESECTION, ARTHROSCOPIC SUBSCAPULARIS REPAIR AND OPEN REPAIR OF SUPRASPINATOUS TENDON    There were no vitals taken for this visit.  Visit Diagnosis:  Difficulty walking  Lack of coordination      Subjective Assessment - 02/11/15 1536    Symptoms Reports decrease in pain. She has passed the kidney stone and is feeling better.   Currently in Pain? No/denies        Gait training throughout session today. x1200' without L ankle support (shoes only), good gait speed, and no foot drop present, although pt  reported that her foot did drop while walking into the clinic from her car today.  x600' indoors on level surface with L foot up brace, then x >1000' outdoors on uneven grass, up/down grassy slopes, and on uneven concrete and up/down curbs without foot drop. Pt also demonstrated improved posture without looking down at feet as she does without the foot up brace.  Provided info regarding where she can order the foot-up brace.  Dynamic gait on compliant mat and compliant balance beam: tandem walking x3 laps forward and 3 laps backward with CGA and occasional stepping off of compliant balance beam. Then performed forward and backward and lateral walking on compliant map with "compliant stepping stones" hidden under mat to further challenge balance and ankles stability with ambulation. Also performed braiding in this manner.                    PT Education - 02/11/15 1620    Education provided Yes   Education Details Scientist, water [email or web address] web page for the [facility], per patient request regarding where to order.   Person(s) Educated Patient   Methods Handout   Comprehension Verbalized understanding          PT Short Term Goals - 01/21/15 1453    PT SHORT TERM GOAL #1   Title Demosntrate correct performance of HEP to address muscle weakness, pain, posture  and balance impairment. Target 01/24/15   PT SHORT TERM GOAL #2   Title Decrease Oswestry pain disability questionaire to 18 = 36% disability (scored 44% initially) Target 01/24/15   Status Achieved   PT SHORT TERM GOAL #3   Title Increase score on DGI to 19/24 Target 01/24/15   Status Achieved  22/24 on 01/21/15   PT SHORT TERM GOAL #4   Title Report ability to tolerate sleeping in bed with modifications to postiion as needed for improved rest at night. Target 01/24/15   PT SHORT TERM GOAL #5   Title Demonstrate ability to sit >5 minutes without increase in pain. Target 01/24/15   Status Not Met  reports increased  paresthesias after sitting 5 minutes on 01/21/14           PT Long Term Goals - 01/06/15 1657    PT LONG TERM GOAL #1   Title Verbalize understanding of posture/body mechanics for injury prevention. Target 02/22/15   PT LONG TERM GOAL #2   Title Verbalize understanding of fall prevention strategies for home environment.Target 02/22/15   PT LONG TERM GOAL #3   Title Increase DGI score to 22/24 for decreased fall risk. Target 02/22/15   PT LONG TERM GOAL #4   Title Oswestry Paper LTG: Decrease score to 14 points = 28% disability (was 44% initially)Target 02/22/15   PT LONG TERM GOAL #5   Title Increase ABC score on FOTO to 53% for increased balance confidence. Target 02/22/15               Plan - 02/11/15 1621    Clinical Impression Statement Pt is making progress despite taking 2 weeks off of therapy. Pain was not present today and she did not have to take pain medication today, but the pain relief is fairly inconsistent. She said she had a lot of pain yesterday. Pt feels more comfortable with the foot-up brace and plans to purchase one for the LLE. Demonstrated good balance with dynamic gait on uneven surfaces today. Continue per plan of care.   PT Next Visit Plan Upgrade HEP, elliptical level 2.5 x 4 minutes. Continue with endurance training and ankle strengthening.   Consulted and Agree with Plan of Care Patient        Problem List Patient Active Problem List   Diagnosis Date Noted  . Left leg pain 11/22/2014  . Fibromyalgia 01/30/2012  . Depression with anxiety 01/30/2012  . Lumbar post-laminectomy syndrome 01/30/2012  . Thoracic or lumbosacral neuritis or radiculitis, unspecified 01/30/2012   .Delrae Sawyers, PT,DPT,NCS 02/11/2015 4:30 PM Phone [phone] FAX [phone]         [facility] [address] [place], [place], [address] Phone: [phone]   Fax:  [phone]

## 2015-02-12 ENCOUNTER — Telehealth: Payer: Self-pay | Admitting: *Deleted

## 2015-02-12 DIAGNOSIS — Z0289 Encounter for other administrative examinations: Secondary | ICD-10-CM

## 2015-02-12 NOTE — Telephone Encounter (Signed)
Form is on [employer] desk.

## 2015-02-13 ENCOUNTER — Ambulatory Visit: Payer: BLUE CROSS/BLUE SHIELD

## 2015-02-13 DIAGNOSIS — M5442 Lumbago with sciatica, left side: Secondary | ICD-10-CM | POA: Diagnosis not present

## 2015-02-13 DIAGNOSIS — R279 Unspecified lack of coordination: Secondary | ICD-10-CM

## 2015-02-13 DIAGNOSIS — R262 Difficulty in walking, not elsewhere classified: Secondary | ICD-10-CM

## 2015-02-13 NOTE — Therapy (Signed)
[facility] [address] [place], [place], [address] Phone: [phone]   Fax:  [phone]  Physical Therapy Treatment  Patient Details  Name: Melanie Avila MRN: [id number] Date of Birth: October 28, 1961 Referring Provider:  Delphina Cahill, MD  Encounter Date: 02/13/2015      PT End of Session - 02/13/15 1619    Visit Number 10   Number of Visits 17   Date for PT Re-Evaluation 02/22/15   Authorization Type BCBS 90 visit limit   PT Start Time 2297   PT Stop Time 1613   PT Time Calculation (min) 43 min      Past Medical History  Diagnosis Date  . Fibromyalgia   . Anemia   . Anxiety   . GERD (gastroesophageal reflux disease)   . Hyperlipidemia   . Hypertension   . Depression   . DDD (degenerative disc disease)   . Complication of anesthesia     last shoulder surg 2010-dsc-2 hr after block to surg-neck swelled-hard to tube-stayed RCC    Past Surgical History  Procedure Laterality Date  . Carpal tunnel release    . Shoulder surgery    . Tubal ligation    . Back surgery  89,02    lumbar x2  . Shoulder arthroscopy with subacromial decompression  10/23/2012    Procedure: SHOULDER ARTHROSCOPY WITH SUBACROMIAL DECOMPRESSION;  Surgeon: Cammie Sickle., MD;  Location: [facility];  Service: Orthopedics;  Laterality: Left;  LEFT SHOULDER  ARTHROSCOPY WITH SUBACROMIAL DECOMPRESSION, DISTAL CLAVICLE RESECTION, ARTHROSCOPIC SUBSCAPULARIS REPAIR AND OPEN REPAIR OF SUPRASPINATOUS TENDON    There were no vitals filed for this visit.  Visit Diagnosis:  Difficulty walking  Lack of coordination  Left-sided low back pain with left-sided sciatica    Reviewed HEP from 1/26, 2/2, and 2/9. Pt performed all exercises as indicated. This included strengthening exercises (therex) and balance exercises (neuro re-ed). Added notes to printed copy for pt to clarify some of the therex and to upgrade the balance exercises.  Notes are as follows: -Clamshell-keep hips/pelvis forward -Hip flexor stretch-underlined info regarding use of step stool, and note to activate core and flatten back for stabilization -Anterior/Posterior limits of stability-added two stacked pillows and closed eyes holding chair if needed -Heel walking- note to perform slowly -heel-toe tandem-note to perform with eyes closed -hamstring stretch-note to lean forward -gastroc stretch-note to perform right before bed if the night time cramps return  Pt completed 4 minutes at level 2.5 on elliptical today.                            PT Short Term Goals - 01/21/15 1453    PT SHORT TERM GOAL #1   Title Demosntrate correct performance of HEP to address muscle weakness, pain, posture and balance impairment. Target 01/24/15   PT SHORT TERM GOAL #2   Title Decrease Oswestry pain disability questionaire to 18 = 36% disability (scored 44% initially) Target 01/24/15   Status Achieved   PT SHORT TERM GOAL #3   Title Increase score on DGI to 19/24 Target 01/24/15   Status Achieved  22/24 on 01/21/15   PT SHORT TERM GOAL #4   Title Report ability to tolerate sleeping in bed with modifications to postiion as needed for improved rest at night. Target 01/24/15   PT SHORT TERM GOAL #5   Title Demonstrate ability to sit >5 minutes without increase in pain. Target  01/24/15   Status Not Met  reports increased paresthesias after sitting 5 minutes on 01/21/14           PT Long Term Goals - 01/06/15 1657    PT LONG TERM GOAL #1   Title Verbalize understanding of posture/body mechanics for injury prevention. Target 02/22/15   PT LONG TERM GOAL #2   Title Verbalize understanding of fall prevention strategies for home environment.Target 02/22/15   PT LONG TERM GOAL #3   Title Increase DGI score to 22/24 for decreased fall risk. Target 02/22/15   PT LONG TERM GOAL #4   Title Oswestry Paper LTG: Decrease score to 14 points = 28% disability  (was 44% initially)Target 02/22/15   PT LONG TERM GOAL #5   Title Increase ABC score on FOTO to 53% for increased balance confidence. Target 02/22/15               Plan - 02/13/15 1619    Clinical Impression Statement Progressed many of pt's balance exercises today. Pt also demonstrated progress with endurance training on elliptical today. Continue per plan of care.   PT Next Visit Plan Elliptical level 2.5 x5 minutes, other endurance training more balance training on compliant surfaces with eyes closed. Begin checking LTGs        Problem List Patient Active Problem List   Diagnosis Date Noted  . Left leg pain 11/22/2014  . Fibromyalgia 01/30/2012  . Depression with anxiety 01/30/2012  . Lumbar post-laminectomy syndrome 01/30/2012  . Thoracic or lumbosacral neuritis or radiculitis, unspecified 01/30/2012   Delrae Sawyers, PT,DPT,NCS 02/13/2015 4:27 PM Phone [phone] FAX [phone]         [facility] [address] [place], [place], [address] Phone: [phone]   Fax:  [phone]

## 2015-02-18 ENCOUNTER — Ambulatory Visit: Payer: BLUE CROSS/BLUE SHIELD

## 2015-02-18 DIAGNOSIS — M5442 Lumbago with sciatica, left side: Secondary | ICD-10-CM | POA: Diagnosis not present

## 2015-02-18 DIAGNOSIS — R279 Unspecified lack of coordination: Secondary | ICD-10-CM

## 2015-02-18 NOTE — Therapy (Signed)
[facility] [address] [place], [place], [address] Phone: [phone]   Fax:  [phone]  Physical Therapy Treatment  Patient Details  Name: Melanie Avila MRN: [id number] Date of Birth: 08-02-1961 Referring Provider:  Delphina Cahill, MD  Encounter Date: 02/18/2015      PT End of Session - 02/18/15 1746    Visit Number 11   Number of Visits 17   Date for PT Re-Evaluation 02/22/15   Authorization Type BCBS 90 visit limit   PT Start Time 1537   PT Stop Time 1620   PT Time Calculation (min) 43 min      Past Medical History  Diagnosis Date  . Fibromyalgia   . Anemia   . Anxiety   . GERD (gastroesophageal reflux disease)   . Hyperlipidemia   . Hypertension   . Depression   . DDD (degenerative disc disease)   . Complication of anesthesia     last shoulder surg 2010-dsc-2 hr after block to surg-neck swelled-hard to tube-stayed RCC    Past Surgical History  Procedure Laterality Date  . Carpal tunnel release    . Shoulder surgery    . Tubal ligation    . Back surgery  89,02    lumbar x2  . Shoulder arthroscopy with subacromial decompression  10/23/2012    Procedure: SHOULDER ARTHROSCOPY WITH SUBACROMIAL DECOMPRESSION;  Surgeon: Cammie Sickle., MD;  Location: [facility];  Service: Orthopedics;  Laterality: Left;  LEFT SHOULDER  ARTHROSCOPY WITH SUBACROMIAL DECOMPRESSION, DISTAL CLAVICLE RESECTION, ARTHROSCOPIC SUBSCAPULARIS REPAIR AND OPEN REPAIR OF SUPRASPINATOUS TENDON    There were no vitals filed for this visit.  Visit Diagnosis:  Lack of coordination      Subjective Assessment - 02/18/15 1542    Symptoms Pt  reports no pain today. And isn't sure if she will be able to make it to therapy friday.    Currently in Pain? No/denies     Neuro Re-ed   DGI- 22/24 Dynamic balance activities while ambulating with eyes closed: -Solid surface-3 laps, then 3 laps with turning R and left and  stopping, backward walking -Compliant surface 3 laps each: heel walking forward and backward, walking with turns and stops, braiding   Therex Elliptical x5 minutes level 2.5 for endurance training with rest required after 5 minutes   Self care Discussed pt's progress in therapy, fall prevention ideas and pt verbalized understanding, and pt thoroughly explained how her posture affects her pain and what she has been doing to correct this. Pt verbalized readiness for d/c      [facility] PT Assessment - 02/18/15 0001    Observation/Other Assessments   Focus on Therapeutic Outcomes (FOTO)  Functional Status 81 (was 60 on eval)   Other Surveys  Select   Activities of Balance Confidence Scale (ABC Scale)  91.9% confidence (was 33.1% on eval)   Oswestry Disability Index  12 points = 24%   Dynamic Gait Index   Level Surface Normal   Change in Gait Speed Normal   Gait with Horizontal Head Turns Normal   Gait with Vertical Head Turns Normal   Gait and Pivot Turn Normal   Step Over Obstacle Normal   Step Around Obstacles Normal   Steps Normal   Total Score 24                           PT Education - 02/18/15 1746  Education provided Yes   Education Details educated on progress made during therapy   Person(s) Educated Patient   Methods Explanation   Comprehension Verbalized understanding          PT Short Term Goals - 01/21/15 1453    PT SHORT TERM GOAL #1   Title Demosntrate correct performance of HEP to address muscle weakness, pain, posture and balance impairment. Target 01/24/15   PT SHORT TERM GOAL #2   Title Decrease Oswestry pain disability questionaire to 18 = 36% disability (scored 44% initially) Target 01/24/15   Status Achieved   PT SHORT TERM GOAL #3   Title Increase score on DGI to 19/24 Target 01/24/15   Status Achieved  22/24 on 01/21/15   PT SHORT TERM GOAL #4   Title Report ability to tolerate sleeping in bed with modifications to postiion as  needed for improved rest at night. Target 01/24/15   PT SHORT TERM GOAL #5   Title Demonstrate ability to sit >5 minutes without increase in pain. Target 01/24/15   Status Not Met  reports increased paresthesias after sitting 5 minutes on 01/21/14           PT Long Term Goals - 02/18/15 1551    PT LONG TERM GOAL #1   Title Verbalize understanding of posture/body mechanics for injury prevention. Target 02/22/15   Status Achieved   PT LONG TERM GOAL #2   Title Verbalize understanding of fall prevention strategies for home environment.Target 02/22/15   Status Achieved   PT LONG TERM GOAL #3   Title Increase DGI score to 22/24 for decreased fall risk. Target 02/22/15   Status Achieved  Scored 24/24 on 02/18/15   PT LONG TERM GOAL #4   Title Oswestry Paper LTG: Decrease score to 14 points = 28% disability (was 44% initially)Target 02/22/15   Status Achieved   PT LONG TERM GOAL #5   Title Increase ABC score on FOTO to 53% for increased balance confidence. Target 02/22/15   Status Achieved               Plan - 02/18/15 1747    Clinical Impression Statement Met therapy goals. D/C today   Consulted and Agree with Plan of Care Patient        Problem List Patient Active Problem List   Diagnosis Date Noted  . Left leg pain 11/22/2014  . Fibromyalgia 01/30/2012  . Depression with anxiety 01/30/2012  . Lumbar post-laminectomy syndrome 01/30/2012  . Thoracic or lumbosacral neuritis or radiculitis, unspecified 01/30/2012   Delrae Sawyers, PT,DPT,NCS 02/18/2015 6:19 PM Phone [phone] FAX [phone]         [facility] [address] [place], [place], [address] Phone: [phone]   Fax:  [phone]   PHYSICAL THERAPY DISCHARGE SUMMARY  Visits from Start of Care: 11  Current functional level related to goals / functional outcomes: Pt met all long term goals. See goals section above.   Remaining  deficits: Pt continues to experience generalized pain which is chronic in nature although this has significantly improved. Pt's balance is now within normal limits and she is no longer at a high risk for falls. She made a perfect score on the standardized balance measure used in the clinic. The only impairment causing fall risk is occasional drop foot.   Education / Equipment: Pt has ordered a foot-up brace to address the occasional drop foot and decrease fall risk. She has been educated on HEP for balance  and pain management, fall prevention strategies and ways that her posture affects her pain.  Plan: Patient agrees to discharge.  Patient goals were met. Patient is being discharged due to meeting the stated rehab goals.  ?????

## 2015-02-18 NOTE — Patient Instructions (Signed)
It is important to avoid accidents which may result in broken bones.  Here are a few ideas on how to make your home safer so you will be less likely to trip or fall.  1. Use nonskid mats or non slip strips in your shower or tub, on your bathroom floor and around sinks.  If you know that you have spilled water, wipe it up! 2. In the bathroom, it is important to have properly installed grab bars on the walls or on the edge of the tub.  Towel racks are NOT strong enough for you to hold onto or to pull on for support. 3. Stairs and hallways should have enough light.  Add lamps or night lights if you need ore light. 4. It is good to have handrails on both sides of the stairs if possible.  Always fix broken handrails right away. 5. It is important to see the edges of steps.  Paint the edges of outdoor steps white so you can see them better.  Put colored tape on the edge of inside steps. 6. Throw-rugs are dangerous because they can slide.  Removing the rugs is the best idea, but if they must stay, add adhesive carpet tape to prevent slipping. 7. Do not keep things on stairs or in the halls.  Remove small furniture that blocks the halls as it may cause you to trip.  Keep telephone and electrical cords out of the way where you walk. 8. Always were sturdy, rubber-soled shoes for good support.  Never wear just socks, especially on the stairs.  Socks may cause you to slip or fall.  Do not wear full-length housecoats as you can easily trip on the bottom.  9. Place the things you use the most on the shelves that are the easiest to reach.  If you use a stepstool, make sure it is in good condition.  If you feel unsteady, DO NOT climb, ask for help. If a health professional advises you to use a cane or walker, do not be ashamed.  These items can keep you from falling and breaking your bones.  

## 2015-02-20 ENCOUNTER — Ambulatory Visit: Payer: BLUE CROSS/BLUE SHIELD

## 2015-02-23 ENCOUNTER — Telehealth: Payer: Self-pay | Admitting: *Deleted

## 2015-02-23 NOTE — Telephone Encounter (Signed)
[facility] form [facility] on 02/23/2015.

## 2015-02-27 ENCOUNTER — Ambulatory Visit
Admission: RE | Admit: 2015-02-27 | Discharge: 2015-02-27 | Disposition: A | Discharge status: Home / Self Care | Payer: BLUE CROSS/BLUE SHIELD | Source: Ambulatory Visit | Attending: Neurology | Admitting: Neurology

## 2015-02-27 ENCOUNTER — Other Ambulatory Visit: Payer: Self-pay | Admitting: Neurology

## 2015-02-27 ENCOUNTER — Ambulatory Visit (INDEPENDENT_AMBULATORY_CARE_PROVIDER_SITE_OTHER): Payer: BLUE CROSS/BLUE SHIELD | Admitting: Neurology

## 2015-02-27 ENCOUNTER — Encounter: Payer: Self-pay | Admitting: Neurology

## 2015-02-27 VITALS — BP 113/71 | HR 94 | Temp 97.6°F | Ht 67.0 in | Wt 188.5 lb

## 2015-02-27 DIAGNOSIS — M5417 Radiculopathy, lumbosacral region: Secondary | ICD-10-CM

## 2015-02-27 MED ORDER — IOHEXOL 180 MG/ML  SOLN
1.0000 mL | Freq: Once | INTRAMUSCULAR | Status: AC | PRN
  Administered 2015-02-27: 1 mL via EPIDURAL

## 2015-02-27 MED ORDER — METHYLPREDNISOLONE ACETATE 40 MG/ML INJ SUSP (RADIOLOG
120.0000 mg | Freq: Once | INTRAMUSCULAR | Status: AC
  Administered 2015-02-27: 120 mg via EPIDURAL

## 2015-02-27 NOTE — Progress Notes (Signed)
[facility]    Provider:  Dr Lucia GaskinsAhern Referring Provider: CatZOXWRUEAalina PizzaHall, [name], MD Primary Care Physician:  Catalina PizzaHALL, [name], MD  CC: Left leg pain  Interval update 01/09/2015: Melanie Avila is a 54 y.o. female here as a follow up for left leg radiculopathy  She is feeling well, she did great in physical therapy. Leg is not collapsing. She is "ready to go back to work". She still has pain but she is working on her posture. She is still interested in left L5/S1 epidural steroid injections and called [facility] and patient spoke to IdealDanielle and is going today to have that done.   Interval update 01/09/2015: Melanie Avila is a 54 y.o. female here as a follow up for left leg pain  She is in physical therapy. Working on balance and strength. PT is helping. Pain is still horrible. She has been on pain medication for 10 years. She has tried steroids in the past which help for a few weeks but never permanently improve or resolve the problem. She has had 2 back surgeries. It helped at the time. The pain has returned. She takes chronic Norco for the pain. She takes up to 5 daily. Leg has not given out recently but she feels weak esp if she has to sit for long periods. She is due to complete physical therapt at the end of march.   Reviewed notes, labs and imaging from outside physicians, which showed:  MRI of the lumbar spine 12/04/2014  Abnormal MRI lumbar spine (without) demonstrating: 1. At L5-S1: left laminectomy, disc bulging, with mild right and moderate-severe left foraminal stenosis; potential impingement upon the exiting left L5 and descending left S1 roots 2. Multi-level facet hypertrophy. 3. Compared to MRI on 07/22/11, no major changes, but possibly subtle increased contact upon the left L5 and S1 roots in the current study.  Initial visit 11/19/2014: She is having knee pain. She has chronic LBP and hx of 2 back surgeries, fibromyalgia. She has chronic weakness in the toes  attributed to lumbar radiculopathy. She was at work in early November and she fell on the kitchen floor, landed on her right hip and right elbow. Her left knee twisted and she damaged it. Since then the knee has been hurting. She hasn't fallen since then. It still hurts, with stiffness in the knee. She has chronic LBP with radiation down the back of the left leg. She has foot pain around the left foot ankle. The toes have numbness and tingling. Right foot sometimes gets numb but it is positional. She can just be walking and the left leg gives out. She feels weakness in her left leg, more weakness. She has fallen 3 times due to this. She also fell in the driveway, about 3x times since November for left leg giving out. No other focal neurologic symptoms.    Review of Systems: Patient complains of symptoms per HPI as well as the following symptoms: numbness, weakness, back pain, aching muscles, muscle cramps, depression, anxiety, restless leg, insomnia, apnea, frequent waking, daytime sleepiness, snoring, shift work. Pertinent negatives per HPI. All others negative.   History   Social History  . Marital Status: Married    Spouse Name: Donnal DebarRandi   . Number of Children: 2  . Years of Education: 12   Occupational History  . Not on file.   Social History Main Topics  . Smoking status: Current Every Day Smoker -- 1.00 packs/day for 35 years    Types: Cigarettes  .  Smokeless tobacco: Never Used  . Alcohol Use: No  . Drug Use: No  . Sexual Activity: Not on file   Other Topics Concern  . Not on file   Social History Narrative   Patient lives at home with husband and son   Patient has 2 children    Patient has a high school education    Patient works has [employer]   Patient is right handed    Caffeine use: 1/2-1 cup coffee occasionally    Drinks tea occas    Family History  Problem Relation Age of Onset  . Alzheimer's disease Mother   . Prostate cancer Father   . Leukemia Father   . Heart  attack Maternal Aunt   . Heart attack Maternal Uncle   . Diabetes Cousin   . Diabetes Cousin     Past Medical History  Diagnosis Date  . Fibromyalgia   . Anemia   . Anxiety   . GERD (gastroesophageal reflux disease)   . Hyperlipidemia   . Hypertension   . Depression   . DDD (degenerative disc disease)   . Complication of anesthesia     last shoulder surg 2010-dsc-2 hr after block to surg-neck swelled-hard to tube-stayed RCC    Past Surgical History  Procedure Laterality Date  . Carpal tunnel release    . Shoulder surgery    . Tubal ligation    . Back surgery  89,02    lumbar x2  . Shoulder arthroscopy with subacromial decompression  10/23/2012    Procedure: SHOULDER ARTHROSCOPY WITH SUBACROMIAL DECOMPRESSION;  Surgeon: Wyn Forster., MD;  Location: [facility];  Service: Orthopedics;  Laterality: Left;  LEFT SHOULDER  ARTHROSCOPY WITH SUBACROMIAL DECOMPRESSION, DISTAL CLAVICLE RESECTION, ARTHROSCOPIC SUBSCAPULARIS REPAIR AND OPEN REPAIR OF SUPRASPINATOUS TENDON    Current Outpatient Prescriptions  Medication Sig Dispense Refill  . ALPRAZolam (XANAX) 1 MG tablet Take 1 mg by mouth 2 (two) times daily as needed. 1 TO 1 1/2 QHS    . amLODipine (NORVASC) 5 MG tablet Take 5 mg by mouth daily.    Marland Kitchen atorvastatin (LIPITOR) 20 MG tablet 20 mg daily.    . carisoprodol (SOMA) 350 MG tablet Take 1 tablet (350 mg total) by mouth every 8 (eight) hours as needed for muscle spasms. 75 tablet 3  . diclofenac (VOLTAREN) 75 MG EC tablet Take 75 mg by mouth 2 (two) times daily.    Marland Kitchen estradiol (ESTRACE) 2 MG tablet TAKE 1 TABLET DAILY 90 tablet 2  . HYDROcodone-acetaminophen (NORCO) 10-325 MG per tablet Take by mouth daily.     . medroxyPROGESTERone (PROVERA) 2.5 MG tablet     . NUCYNTA ER 50 MG TB12     . sertraline (ZOLOFT) 100 MG tablet     . telmisartan-hydrochlorothiazide (MICARDIS HCT) 80-25 MG per tablet Take 1 tablet by mouth daily.    Marland Kitchen omeprazole (PRILOSEC) 20 MG  capsule Take 20 mg by mouth as needed.      No current facility-administered medications for this visit.    Allergies as of 02/27/2015 - Review Complete 02/27/2015  Allergen Reaction Noted  . Cymbalta [duloxetine hcl]  01/05/2012  . Sulfa antibiotics Swelling 09/29/2011  . Effexor [venlafaxine hydrochloride]  01/05/2012    Vitals: BP 113/71 mmHg  Pulse 94  Temp(Src) 97.6 F (36.4 C)  Ht  (1.702 m)  Wt 188 lb 8 oz (85.503 kg)  BMI 29.52 kg/m2 Last Weight:  Wt Readings from Last 1 Encounters:  02/27/15  188 lb 8 oz (85.503 kg)   Last Height:   Ht Readings from Last 1 Encounters:  02/27/15  (1.702 m)    Strength: Strength improved, 5/5 left HS and 5/5 left dorsiflexion.   Sensation: Left lateral foot and leg numbness   DTR: absent left ankle DTR    Assessment/Plan:  54 year old female PMHx chronic pain and Fibromyalgia, depression, anxiety with chronic LBP and hx of 2 back surgeries, fibromyalgia with left leg weakness, numbness, radicular pain after a fall. MRI shows impingement on the left L5 and S1 roots.  Completed physical therapy, she is "ready to go back to work". She is going for LS ESI today at 1:15pm.  She has not tolerated Cymbalta or Lyrica in the past. We will see if the ESI help, she is on anti-inflammatory medication daily which helps. She is not interested in back surgery.   Follow up 6 months   [name], MD  [facility] [address] [place], [place] [address]  Phone [phone] Fax [phone]  A total of 15 minutes was spent face-to-face with this patient. Over half this time was spent on counseling patient on the lumbosacral radiculopathy diagnosis and different diagnostic and therapeutic options available.

## 2015-02-27 NOTE — Patient Instructions (Signed)
Overall you are doing fairly well but I do want to suggest a few things today:   Remember to drink plenty of fluid, eat healthy meals and do not skip any meals. Try to eat protein with a every meal and eat a healthy snack such as fruit or nuts in between meals. Try to keep a regular sleep-wake schedule and try to exercise daily, particularly in the form of walking, 20-30 minutes a day, if you can.   I would like to see you back in 6 months, sooner if we need to. Please call us with any interim questions, concerns, problems, updates or refill requests.   Please also call us for any test results so we can go over those with you on the phone.  My clinical assistant and will answer any of your questions and relay your messages to me and also relay most of my messages to you.   Our phone number is [phone]. We also have an after hours call service for urgent matters and there is a physician on-call for urgent questions. For any emergencies you know to call 911 or go to the nearest emergency room   

## 2015-02-27 NOTE — Discharge Instructions (Signed)

## 2015-02-28 ENCOUNTER — Encounter: Payer: Self-pay | Admitting: Neurology

## 2015-05-19 ENCOUNTER — Other Ambulatory Visit: Payer: Self-pay | Admitting: Neurology

## 2015-05-19 DIAGNOSIS — M5416 Radiculopathy, lumbar region: Secondary | ICD-10-CM

## 2015-05-25 ENCOUNTER — Ambulatory Visit
Admission: RE | Admit: 2015-05-25 | Discharge: 2015-05-25 | Disposition: A | Discharge status: Home / Self Care | Payer: BLUE CROSS/BLUE SHIELD | Source: Ambulatory Visit | Attending: Neurology | Admitting: Neurology

## 2015-05-25 ENCOUNTER — Other Ambulatory Visit: Payer: Self-pay | Admitting: Neurology

## 2015-05-25 DIAGNOSIS — M5416 Radiculopathy, lumbar region: Secondary | ICD-10-CM

## 2015-05-25 MED ORDER — METHYLPREDNISOLONE ACETATE 40 MG/ML INJ SUSP (RADIOLOG: 120.0000 mg | Freq: Once | INTRAMUSCULAR | Status: DC

## 2015-05-25 MED ORDER — IOHEXOL 180 MG/ML  SOLN: 1.0000 mL | Freq: Once | INTRAMUSCULAR | Status: AC | PRN

## 2015-08-13 ENCOUNTER — Telehealth: Payer: Self-pay | Admitting: Neurology

## 2015-08-13 DIAGNOSIS — M5417 Radiculopathy, lumbosacral region: Secondary | ICD-10-CM

## 2015-08-13 NOTE — Telephone Encounter (Signed)
Order placed under referral. Referral faxed to [facility] imaging at [phone]. Received fax confirmation.

## 2015-08-13 NOTE — Telephone Encounter (Signed)
Melanie Avila - can you order please? thanks

## 2015-08-13 NOTE — Telephone Encounter (Signed)
Patient called [facility] to set up 3rd inject but they need an order for DG Epidurography. The order expired 06/30/15. Please call and advise. Patient can be reached at [phone].

## 2015-08-13 NOTE — Telephone Encounter (Signed)
Called and spoke w/ pt to let her know order was sent over to [facility] imaging and she can call and make appt. She verbalized understanding.

## 2015-08-14 ENCOUNTER — Other Ambulatory Visit: Payer: Self-pay | Admitting: Neurology

## 2015-08-14 DIAGNOSIS — M5417 Radiculopathy, lumbosacral region: Secondary | ICD-10-CM

## 2015-08-26 ENCOUNTER — Ambulatory Visit
Admission: RE | Admit: 2015-08-26 | Discharge: 2015-08-26 | Disposition: A | Discharge status: Home / Self Care | Payer: BLUE CROSS/BLUE SHIELD | Source: Ambulatory Visit | Attending: Neurology | Admitting: Neurology

## 2015-08-26 DIAGNOSIS — M5417 Radiculopathy, lumbosacral region: Secondary | ICD-10-CM

## 2015-08-26 MED ORDER — IOHEXOL 180 MG/ML  SOLN
1.0000 mL | Freq: Once | INTRAMUSCULAR | Status: DC | PRN
  Administered 2015-08-26: 1 mL via EPIDURAL

## 2015-08-26 MED ORDER — METHYLPREDNISOLONE ACETATE 40 MG/ML INJ SUSP (RADIOLOG
120.0000 mg | Freq: Once | INTRAMUSCULAR | Status: AC
  Administered 2015-08-26: 120 mg via EPIDURAL

## 2015-08-31 ENCOUNTER — Encounter (INDEPENDENT_AMBULATORY_CARE_PROVIDER_SITE_OTHER): Payer: Self-pay | Admitting: *Deleted

## 2015-08-31 ENCOUNTER — Ambulatory Visit: Payer: BLUE CROSS/BLUE SHIELD | Admitting: Neurology

## 2015-09-23 ENCOUNTER — Encounter (INDEPENDENT_AMBULATORY_CARE_PROVIDER_SITE_OTHER): Payer: Self-pay | Admitting: *Deleted

## 2015-09-24 ENCOUNTER — Other Ambulatory Visit (INDEPENDENT_AMBULATORY_CARE_PROVIDER_SITE_OTHER): Payer: Self-pay | Admitting: *Deleted

## 2015-09-24 DIAGNOSIS — Z1211 Encounter for screening for malignant neoplasm of colon: Secondary | ICD-10-CM

## 2015-11-11 ENCOUNTER — Telehealth (INDEPENDENT_AMBULATORY_CARE_PROVIDER_SITE_OTHER): Payer: Self-pay | Admitting: *Deleted

## 2015-11-11 DIAGNOSIS — Z1211 Encounter for screening for malignant neoplasm of colon: Secondary | ICD-10-CM

## 2015-11-11 MED ORDER — PEG 3350-KCL-NA BICARB-NACL 420 G PO SOLR: 4000.0000 mL | Freq: Once | ORAL | Status: DC

## 2015-11-11 NOTE — Telephone Encounter (Signed)
Patient needs trilyte 

## 2015-11-11 NOTE — Telephone Encounter (Signed)
Referring MD/PCP: hall   Procedure: tcs  Reason/Indication:  Screening, fam hx colon ca  Has patient had this procedure before?  no  If so, when, by whom and where?    Is there a family history of colon cancer?  Yes, cousin  Who?  What age when diagnosed?    Is patient diabetic?   no      Does patient have prosthetic heart valve or mechanical valve?  no  Do you have a pacemaker?  no  Has patient ever had endocarditis? no  Has patient had joint replacement within last 12 months?  no  Does patient tend to be constipated or take laxatives? no  Does patient have a history of alcohol/drug use?  no  Is patient on Coumadin, Plavix and/or Aspirin? no  Medications: see epic  Allergies: see epic  Medication Adjustment:   Procedure date & time: 12/09/15 at 830

## 2015-12-09 ENCOUNTER — Encounter (HOSPITAL_COMMUNITY): Payer: Self-pay | Admitting: *Deleted

## 2015-12-09 ENCOUNTER — Encounter (HOSPITAL_COMMUNITY)
Admission: RE | Disposition: A | Discharge status: Home / Self Care | Payer: Self-pay | Source: Ambulatory Visit | Attending: Internal Medicine

## 2015-12-09 ENCOUNTER — Ambulatory Visit (HOSPITAL_COMMUNITY)
Admission: RE | Admit: 2015-12-09 | Discharge: 2015-12-09 | Disposition: A | Discharge status: Home / Self Care | Payer: BLUE CROSS/BLUE SHIELD | Source: Ambulatory Visit | Attending: Internal Medicine | Admitting: Internal Medicine

## 2015-12-09 DIAGNOSIS — F17210 Nicotine dependence, cigarettes, uncomplicated: Secondary | ICD-10-CM | POA: Insufficient documentation

## 2015-12-09 DIAGNOSIS — Z1211 Encounter for screening for malignant neoplasm of colon: Secondary | ICD-10-CM | POA: Diagnosis present

## 2015-12-09 DIAGNOSIS — E785 Hyperlipidemia, unspecified: Secondary | ICD-10-CM | POA: Insufficient documentation

## 2015-12-09 DIAGNOSIS — I10 Essential (primary) hypertension: Secondary | ICD-10-CM | POA: Diagnosis not present

## 2015-12-09 DIAGNOSIS — Z791 Long term (current) use of non-steroidal anti-inflammatories (NSAID): Secondary | ICD-10-CM | POA: Insufficient documentation

## 2015-12-09 DIAGNOSIS — F419 Anxiety disorder, unspecified: Secondary | ICD-10-CM | POA: Insufficient documentation

## 2015-12-09 DIAGNOSIS — Z79899 Other long term (current) drug therapy: Secondary | ICD-10-CM | POA: Diagnosis not present

## 2015-12-09 DIAGNOSIS — F329 Major depressive disorder, single episode, unspecified: Secondary | ICD-10-CM | POA: Diagnosis not present

## 2015-12-09 DIAGNOSIS — M797 Fibromyalgia: Secondary | ICD-10-CM | POA: Diagnosis not present

## 2015-12-09 HISTORY — DX: Nausea with vomiting, unspecified: R11.2

## 2015-12-09 HISTORY — DX: Other specified postprocedural states: Z98.890

## 2015-12-09 HISTORY — PX: COLONOSCOPY: SHX5424

## 2015-12-09 SURGERY — COLONOSCOPY
Anesthesia: Moderate Sedation

## 2015-12-09 MED ORDER — MIDAZOLAM HCL 5 MG/5ML IJ SOLN
INTRAMUSCULAR | Status: AC
  Filled 2015-12-09: qty 10

## 2015-12-09 MED ORDER — PROMETHAZINE HCL 25 MG/ML IJ SOLN
INTRAMUSCULAR | Status: AC
  Filled 2015-12-09: qty 1

## 2015-12-09 MED ORDER — MIDAZOLAM HCL 5 MG/5ML IJ SOLN
INTRAMUSCULAR | Status: DC | PRN
  Administered 2015-12-09: 2 mg via INTRAVENOUS
  Administered 2015-12-09: 3 mg via INTRAVENOUS
  Administered 2015-12-09: 2 mg via INTRAVENOUS
  Administered 2015-12-09: 3 mg via INTRAVENOUS

## 2015-12-09 MED ORDER — ONDANSETRON HCL 4 MG/2ML IJ SOLN
INTRAMUSCULAR | Status: AC
  Filled 2015-12-09: qty 2

## 2015-12-09 MED ORDER — SODIUM CHLORIDE 0.9 % IJ SOLN
INTRAMUSCULAR | Status: AC
  Filled 2015-12-09: qty 3

## 2015-12-09 MED ORDER — MEPERIDINE HCL 50 MG/ML IJ SOLN
INTRAMUSCULAR | Status: AC
  Filled 2015-12-09: qty 1

## 2015-12-09 MED ORDER — MEPERIDINE HCL 100 MG/ML IJ SOLN
INTRAMUSCULAR | Status: AC
  Filled 2015-12-09: qty 2

## 2015-12-09 MED ORDER — SODIUM CHLORIDE 0.9 % IV SOLN
INTRAVENOUS | Status: DC
  Administered 2015-12-09: 1000 mL via INTRAVENOUS

## 2015-12-09 MED ORDER — PROMETHAZINE HCL 25 MG/ML IJ SOLN
INTRAMUSCULAR | Status: DC | PRN
Start: 2015-12-09 — End: 2015-12-09
  Administered 2015-12-09: 12.5 mg via INTRAVENOUS

## 2015-12-09 MED ORDER — STERILE WATER FOR IRRIGATION IR SOLN
Status: DC | PRN
  Administered 2015-12-09: 2.5 mL

## 2015-12-09 MED ORDER — MEPERIDINE HCL 50 MG/ML IJ SOLN
INTRAMUSCULAR | Status: DC | PRN
  Administered 2015-12-09 (×2): 25 mg via INTRAVENOUS

## 2015-12-09 NOTE — H&P (Addendum)
Melanie Avila is an 55 y.o. Avila.   Chief Complaint: Patient is here for colonoscopy. HPI: Patient is Melanie Avila who is here for screening exam. She denies abdominal pain change in bowel habits or rectal bleeding. She believes her first cousin was treated for colon carcinoma 10 years ago but she has not absolutely certain.  Past Medical History  Diagnosis Date  . Fibromyalgia   . Anemia   . Anxiety   . GERD (gastroesophageal reflux disease)   . Hyperlipidemia   . Hypertension   . Depression   . DDD (degenerative disc disease)   . Complication of anesthesia     last shoulder surg 2010-dsc-2 hr after block to surg-neck swelled-hard to tube-stayed RCC  . PONV (postoperative nausea and vomiting)     Past Surgical History  Procedure Laterality Date  . Carpal tunnel release    . Shoulder surgery    . Tubal ligation    . Back surgery  89,02    lumbar x2  . Shoulder arthroscopy with subacromial decompression  10/23/2012    Procedure: SHOULDER ARTHROSCOPY WITH SUBACROMIAL DECOMPRESSION;  Surgeon: Wyn Forsterobert V Sypher Jr., MD;  Location: [facility];  Service: Orthopedics;  Laterality: Left;  LEFT SHOULDER  ARTHROSCOPY WITH SUBACROMIAL DECOMPRESSION, DISTAL CLAVICLE RESECTION, ARTHROSCOPIC SUBSCAPULARIS REPAIR AND OPEN REPAIR OF SUPRASPINATOUS TENDON    Family History  Problem Relation Age of Onset  . Alzheimer's disease Mother   . Prostate cancer Father   . Leukemia Father   . Heart attack Maternal Aunt   . Heart attack Maternal Uncle   . Diabetes Cousin   . Diabetes Cousin    Social History:  reports that she has been smoking Cigarettes.  She has a 35 pack-year smoking history. She has never used smokeless tobacco. She reports that she does not drink alcohol or use illicit drugs.  Allergies:  Allergies  Allergen Reactions  . Cymbalta [Duloxetine Hcl]     Suicidal thoughts  . Effexor [Venlafaxine Hydrochloride] Other (See Comments)    Suicidal  thoughts  . Sulfa Antibiotics Swelling  . Lyrica [Pregabalin] Other (See Comments)    dizziness    Medications Prior to Admission  Medication Sig Dispense Refill  . ALPRAZolam (XANAX) 1 MG tablet Take 1 mg by mouth 2 (two) times daily as needed for anxiety.     Marland Kitchen. amLODipine (NORVASC) 5 MG tablet Take 5 mg by mouth daily.    Marland Kitchen. atorvastatin (LIPITOR) 20 MG tablet Take 20 mg by mouth daily.     . carisoprodol (SOMA) 350 MG tablet Take 1 tablet (350 mg total) by mouth every 8 (eight) hours as needed for muscle spasms. 75 tablet 3  . diclofenac (VOLTAREN) 75 MG EC tablet Take 75 mg by mouth 2 (two) times daily.    Marland Kitchen. estradiol (ESTRACE) 2 MG tablet TAKE 1 TABLET DAILY 90 tablet 2  . HYDROcodone-acetaminophen (NORCO) 10-325 MG per tablet Take 1 tablet by mouth 5 (five) times daily as needed for moderate pain.     . medroxyPROGESTERone (PROVERA) 2.5 MG tablet Take 2.5 mg by mouth daily.     [name]. NUCYNTA ER 50 MG TB12 Take 1 tablet by mouth 2 (two) times daily.     Marland Kitchen. omeprazole (PRILOSEC) 20 MG capsule Take 20 mg by mouth daily as needed (heartburn).     . polyethylene glycol-electrolytes (NULYTELY/GOLYTELY) 420 G solution Take 4,000 mLs by mouth once. 4000 mL 0  . sertraline (ZOLOFT) 100 MG tablet Take 100  mg by mouth daily.     Marland Kitchen telmisartan-hydrochlorothiazide (MICARDIS HCT) 80-25 MG per tablet Take 1 tablet by mouth daily.      No results found for this or any previous visit (from the past 48 hour(s)). No results found.  ROS  Blood pressure 137/83, pulse 83, temperature 97.6 F (36.4 C), temperature source Oral, resp. rate 16, height 5\' 5"  (1.651 m), weight 191 lb (86.637 kg), SpO2 98 %. Physical Exam  Constitutional: She appears well-developed and well-nourished.  HENT:  Mouth/Throat: Oropharynx is clear and moist.  Eyes: Conjunctivae are normal. No scleral icterus.  Neck: No thyromegaly present.  Cardiovascular: Normal rate and regular rhythm.   Murmur (Faint systolic ejection murmur  best heard at aortic area.) heard. GI: Soft. She exhibits no distension and no mass. There is no tenderness.  Musculoskeletal: She exhibits no edema.  Lymphadenopathy:    She has no cervical adenopathy.  Neurological: She is alert.  Skin: Skin is warm and dry.     Assessment/Plan Average risk screening colonoscopy.   Krystle Oberman U 12/09/2015, 8:35 AM

## 2015-12-09 NOTE — Op Note (Addendum)
COLONOSCOPY PROCEDURE REPORT  PATIENT:  Melanie Avila  MR#:  [id number] Birthdate:  12/20/1960, 55 y.o., female Endoscopist:  Dr. Malissa HippoNajeeb U. Dustine Bertini, MD Referred By:  Dr. Catalina PizzaZach Hall, MD Procedure Date: 12/09/2015  Procedure:   Colonoscopy  Indications:  Patient is 55 year old Caucasian female who is undergoing average risk screening colonoscopy.  Informed Consent:  The procedure and risks were reviewed with the patient and informed consent was obtained.  Medications:  Demerol 50 mg IV Versed 10 mg IV Promethazine 12.5 mg IV in diluted form Moderate sedation started @8 :45A.M. procedure ended scope out @9 :05 A.M.   Description of procedure:  After a digital rectal exam was performed, that colonoscope was advanced from the anus through the rectum and colon to the area of the cecum, ileocecal valve and appendiceal orifice. The cecum was deeply intubated. These structures were well-seen and photographed for the record. From the level of the cecum and ileocecal valve, the scope was slowly and cautiously withdrawn. The mucosal surfaces were carefully surveyed utilizing scope tip to flexion to facilitate fold flattening as needed. The scope was pulled down into the rectum where a thorough exam including retroflexion was performed.  Findings:   Prep satisfactory. Normal mucosa of cecum, ascending colon, hepatic flexure, transverse colon, splenic flexure, descending and sigmoid colon. Normal rectal mucosa and anorectal junction.   Therapeutic/Diagnostic Maneuvers Performed: None  Complications:  None  EBL: None  Cecal Withdrawal Time:  7 minutes  Impression:  Normal colonoscopy.  Recommendations:  Standard instructions given. Next screening exam in 10 years.  Melanie Avila U  12/09/2015 9:12 AM  CC: Dr. Dwana MelenaZack Hall, MD & Dr. Bonnetta BarryNo ref. provider found

## 2015-12-09 NOTE — Discharge Instructions (Signed)
Resume usual medications and diet. No driving for 24 hours. Next screening exam in 10 years unless family history changes.  Colonoscopy, Care After Refer to this sheet in the next few weeks. These instructions provide you with information on caring for yourself after your procedure. Your health care provider may also give you more specific instructions. Your treatment has been planned according to current medical practices, but problems sometimes occur. Call your health care provider if you have any problems or questions after your procedure. Dr. Karilyn Cotaehman [phone]   WHAT TO EXPECT AFTER THE PROCEDURE  After your procedure, it is typical to have the following:  A small amount of blood in your stool.  Moderate amounts of gas and mild abdominal cramping or bloating. HOME CARE INSTRUCTIONS  Do not drive, operate machinery, or sign important documents for 24 hours.  You may shower and resume your regular physical activities, but move at a slower pace for the first 24 hours.  Take frequent rest periods for the first 24 hours.  Drink enough fluids to keep your urine clear or pale yellow.  You may resume your normal diet as instructed by your health care provider. Avoid heavy or fried foods that are hard to digest.  Avoid drinking alcohol for 24 hours or as instructed by your health care provider.  Only take over-the-counter or prescription medicines as directed by your health care provider. SEEK MEDICAL CARE IF: You have persistent spotting of blood in your stool 2-3 days after the procedure. SEEK IMMEDIATE MEDICAL CARE IF:  You have more than a small spotting of blood in your stool.  You pass large blood clots in your stool.  Your abdomen is swollen (distended).  You have nausea or vomiting.  You have a fever.  You have increasing abdominal pain that is not relieved with medicine.   This information is not intended to replace advice given to you by your health care provider.  Make sure you discuss any questions you have with your health care provider.   Document Released: 07/05/2004 Document Revised: 09/11/2013 Document Reviewed: 07/29/2013 Elsevier Interactive Patient Education [employer]2016 Elsevier Inc.

## 2015-12-11 ENCOUNTER — Encounter (HOSPITAL_COMMUNITY): Payer: Self-pay | Admitting: Internal Medicine

## 2015-12-28 ENCOUNTER — Ambulatory Visit (INDEPENDENT_AMBULATORY_CARE_PROVIDER_SITE_OTHER): Payer: BLUE CROSS/BLUE SHIELD | Admitting: Neurology

## 2015-12-28 ENCOUNTER — Other Ambulatory Visit: Payer: Self-pay | Admitting: Neurology

## 2015-12-28 ENCOUNTER — Encounter: Payer: Self-pay | Admitting: Neurology

## 2015-12-28 VITALS — BP 135/85 | HR 85 | Ht 65.0 in | Wt 188.2 lb

## 2015-12-28 DIAGNOSIS — M5416 Radiculopathy, lumbar region: Secondary | ICD-10-CM | POA: Diagnosis not present

## 2015-12-28 MED ORDER — TRAMADOL HCL 50 MG PO TABS: ORAL_TABLET | ORAL | Status: DC

## 2015-12-28 NOTE — Progress Notes (Signed)
[facility]    CC: Left leg pain  Interval update 12/28/2015:   ESi in the back are helping. No problems with bowel and bladder. No falls. She finished physical therapy and she went back to work. Things are much better. No falls.   Interval update 02/27/2015: Melanie Avila is a 55 y.o. female here as a follow up for left leg radiculopathy  She is feeling well, she did great in physical therapy. Leg is not collapsing. She is "ready to go back to work". She still has pain but she is working on her posture. She is still interested in left L5/S1 epidural steroid injections and called [facility] and patient spoke to [place] and is going today to have that done.   Interval update 01/09/2015: Melanie Avila is a 55 y.o. female here as a follow up for left leg pain  She is in physical therapy. Working on balance and strength. PT is helping. Pain is still horrible. She has been on pain medication for 10 years. She has tried steroids in the past which help for a few weeks but never permanently improve or resolve the problem. She has had 2 back surgeries. It helped at the time. The pain has returned. She takes chronic Norco for the pain. She takes up to 5 daily. Leg has not given out recently but she feels weak esp if she has to sit for long periods. She is due to complete physical therapt at the end of march.   Reviewed notes, labs and imaging from outside physicians, which showed:  MRI of the lumbar spine 12/04/2014  Abnormal MRI lumbar spine (without) demonstrating: 1. At L5-S1: left laminectomy, disc bulging, with mild right and moderate-severe left foraminal stenosis; potential impingement upon the exiting left L5 and descending left S1 roots 2. Multi-level facet hypertrophy. 3. Compared to MRI on 07/22/11, no major changes, but possibly subtle increased contact upon the left L5 and S1 roots in the current study.  Initial visit 11/19/2014: She is having knee pain.  She has chronic LBP and hx of 2 back surgeries, fibromyalgia. She has chronic weakness in the toes attributed to lumbar radiculopathy. She was at work in early November and she fell on the kitchen floor, landed on her right hip and right elbow. Her left knee twisted and she damaged it. Since then the knee has been hurting. She hasn't fallen since then. It still hurts, with stiffness in the knee. She has chronic LBP with radiation down the back of the left leg. She has foot pain around the left foot ankle. The toes have numbness and tingling. Right foot sometimes gets numb but it is positional. She can just be walking and the left leg gives out. She feels weakness in her left leg, more weakness. She has fallen 3 times due to this. She also fell in the driveway, about 3x times since November for left leg giving out. No other focal neurologic symptoms.    Review of Systems: Patient complains of symptoms per HPI as well as the following symptoms: numbness, weakness, back pain, aching muscles, muscle cramps, depression, anxiety, restless leg, insomnia, apnea, frequent waking, daytime sleepiness, snoring, shift work. Pertinent negatives per HPI. All others negative.  Social History   Social History  . Marital Status: Married    Spouse Name: Donnal Debar   . Number of Children: 2  . Years of Education: 12   Occupational History  . Not on file.   Social History Main Topics  .  Smoking status: Current Every Day Smoker -- 1.00 packs/day for 35 years    Types: Cigarettes  . Smokeless tobacco: Never Used  . Alcohol Use: No  . Drug Use: No  . Sexual Activity: Not on file   Other Topics Concern  . Not on file   Social History Narrative   Patient lives at home with husband and son   Patient has 2 children    Patient has a high school education    Patient works has [employer]   Patient is right handed    Caffeine use: 1/2-1 cup coffee occasionally    Drinks tea occas    Family History  Problem Relation Age  of Onset  . Alzheimer's disease Mother   . Prostate cancer Father   . Leukemia Father   . Heart attack Maternal Aunt   . Heart attack Maternal Uncle   . Diabetes Cousin   . Diabetes Cousin     Past Medical History  Diagnosis Date  . Fibromyalgia   . Anemia   . Anxiety   . GERD (gastroesophageal reflux disease)   . Hyperlipidemia   . Hypertension   . Depression   . DDD (degenerative disc disease)   . Complication of anesthesia     last shoulder surg 2010-dsc-2 hr after block to surg-neck swelled-hard to tube-stayed RCC  . PONV (postoperative nausea and vomiting)     Past Surgical History  Procedure Laterality Date  . Carpal tunnel release    . Shoulder surgery    . Tubal ligation    . Back surgery  89,02    lumbar x2  . Shoulder arthroscopy with subacromial decompression  10/23/2012    Procedure: SHOULDER ARTHROSCOPY WITH SUBACROMIAL DECOMPRESSION;  Surgeon: Wyn Forster., MD;  Location: [facility];  Service: Orthopedics;  Laterality: Left;  LEFT SHOULDER  ARTHROSCOPY WITH SUBACROMIAL DECOMPRESSION, DISTAL CLAVICLE RESECTION, ARTHROSCOPIC SUBSCAPULARIS REPAIR AND OPEN REPAIR OF SUPRASPINATOUS TENDON  . Colonoscopy N/A 12/09/2015    Procedure: COLONOSCOPY;  Surgeon: Malissa Hippo, MD;  [facility];  Service: Endoscopy;  Laterality: N/A;  830    Current Outpatient Prescriptions  Medication Sig Dispense Refill  . ALPRAZolam (XANAX) 1 MG tablet Take 1 mg by mouth 2 (two) times daily as needed for anxiety.     Marland Kitchen amLODipine (NORVASC) 5 MG tablet Take 5 mg by mouth daily.    Marland Kitchen atorvastatin (LIPITOR) 20 MG tablet Take 20 mg by mouth daily.     . carisoprodol (SOMA) 350 MG tablet Take 1 tablet (350 mg total) by mouth every 8 (eight) hours as needed for muscle spasms. 75 tablet 3  . diclofenac (VOLTAREN) 75 MG EC tablet Take 75 mg by mouth 2 (two) times daily.    Marland Kitchen estradiol (ESTRACE) 2 MG tablet TAKE 1 TABLET DAILY 90 tablet 2  .  HYDROcodone-acetaminophen (NORCO) 10-325 MG per tablet Take 1 tablet by mouth 5 (five) times daily as needed for moderate pain.     . medroxyPROGESTERone (PROVERA) 2.5 MG tablet Take 2.5 mg by mouth daily.     [name] ER 50 MG TB12 Take 1 tablet by mouth 2 (two) times daily.     Marland Kitchen omeprazole (PRILOSEC) 20 MG capsule Take 20 mg by mouth daily as needed (heartburn).     . sertraline (ZOLOFT) 100 MG tablet Take 100 mg by mouth daily.     Marland Kitchen telmisartan-hydrochlorothiazide (MICARDIS HCT) 80-25 MG per tablet Take 1 tablet by mouth daily.  No current facility-administered medications for this visit.    Allergies as of 12/28/2015 - Review Complete 12/28/2015  Allergen Reaction Noted  . Cymbalta [duloxetine hcl]  01/05/2012  . Effexor [venlafaxine hydrochloride] Other (See Comments) 01/05/2012  . Sulfa antibiotics Swelling 09/29/2011  . Lyrica [pregabalin] Other (See Comments) 02/27/2015    Vitals: BP 135/85 mmHg  Pulse 85  Ht  (1.651 m)  Wt 188 lb 3.2 oz (85.367 kg)  BMI 31.32 kg/m2 Last Weight:  Wt Readings from Last 1 Encounters:  12/28/15 188 lb 3.2 oz (85.367 kg)   Last Height:   Ht Readings from Last 1 Encounters:  12/28/15  (1.651 m)    Strength: Strength improved, 5/5 left HS and 5/5 left dorsiflexion.   Sensation: Left lateral foot and leg numbness   DTR: absent left ankle DTR   Assessment/Plan: 55 year old female PMHx chronic pain and Fibromyalgia, depression, anxiety with chronic LBP and hx of 2 back surgeries, fibromyalgia with left leg weakness, numbness, radicular pain after a fall. MRI shows impingement on the left L5 and S1 roots. Completed physical therapy, went back to work, feeling better, on chronic pain medication at a pain clinic.  She has not tolerated Cymbalta or Lyrica in the past. She is not interested in back surgery.   She goes to pain clinic and she has run out of meds due to insurance problems. Will give her tramadol prn limited  supply and recommend going back to pain clinic. She will have to get her hydrocodone and nucynta from pain clinic.   Discussed risk of seratonin syndrome with tramadol and sertraline. Patient understands risks.  Follow up as needed   [name], MD  [facility] [address] [place], [place] [address]  Phone [phone] Fax [phone]  A total of 30 minutes was spent face-to-face with this patient. Over half this time was spent on counseling patient on the lumbar radiculopathy  diagnosis and different diagnostic and therapeutic options available.

## 2015-12-28 NOTE — Addendum Note (Signed)
Addended by: [name] B on: 12/28/2015 08:24 AM   Modules accepted: Orders

## 2015-12-30 ENCOUNTER — Ambulatory Visit
Admission: RE | Admit: 2015-12-30 | Discharge: 2015-12-30 | Disposition: A | Discharge status: Home / Self Care | Payer: BLUE CROSS/BLUE SHIELD | Source: Ambulatory Visit | Attending: Neurology | Admitting: Neurology

## 2015-12-30 ENCOUNTER — Other Ambulatory Visit: Payer: Self-pay | Admitting: Neurology

## 2015-12-30 DIAGNOSIS — M5416 Radiculopathy, lumbar region: Secondary | ICD-10-CM

## 2015-12-30 MED ORDER — METHYLPREDNISOLONE ACETATE 40 MG/ML INJ SUSP (RADIOLOG
120.0000 mg | Freq: Once | INTRAMUSCULAR | Status: AC
  Administered 2015-12-30: 120 mg via EPIDURAL

## 2015-12-30 MED ORDER — IOHEXOL 180 MG/ML  SOLN
1.0000 mL | Freq: Once | INTRAMUSCULAR | Status: AC | PRN
Start: 2015-12-30 — End: 2015-12-30
  Administered 2015-12-30: 1 mL via EPIDURAL

## 2015-12-30 NOTE — Discharge Instructions (Signed)

## 2016-03-10 ENCOUNTER — Telehealth: Payer: Self-pay | Admitting: Neurology

## 2016-03-10 DIAGNOSIS — M5416 Radiculopathy, lumbar region: Secondary | ICD-10-CM

## 2016-03-10 NOTE — Telephone Encounter (Signed)
Dr Lucia GaskinsAhern- are you okay with this? If so, can you place order? Thank you!  Pt had ESI in January and would [facility]. Advised if ok by Dr Lucia GaskinsAhern, we will place order and they will contact her to schedule. If not okay, I will call her back. She verbalized understanding.

## 2016-03-10 NOTE — Telephone Encounter (Signed)
Pt is requesting order for lumbar epidural to be sent to GI.

## 2016-03-10 NOTE — Telephone Encounter (Signed)
That's fine. thanks

## 2016-03-11 NOTE — Telephone Encounter (Signed)
Faxed order to [facility] to have pt scheduled. Fax: [phone]. Received confirmation.

## 2016-03-11 NOTE — Telephone Encounter (Signed)
[facility].

## 2016-03-11 NOTE — Telephone Encounter (Signed)
Spoke to Melanie Avila at [facility]. She will call pt to schedule ESI.

## 2016-03-23 DIAGNOSIS — G894 Chronic pain syndrome: Secondary | ICD-10-CM | POA: Diagnosis not present

## 2016-03-28 DIAGNOSIS — R7301 Impaired fasting glucose: Secondary | ICD-10-CM | POA: Diagnosis not present

## 2016-03-28 DIAGNOSIS — E782 Mixed hyperlipidemia: Secondary | ICD-10-CM | POA: Diagnosis not present

## 2016-03-30 ENCOUNTER — Ambulatory Visit
Admission: RE | Admit: 2016-03-30 | Discharge: 2016-03-30 | Disposition: A | Discharge status: Home / Self Care | Payer: BLUE CROSS/BLUE SHIELD | Source: Ambulatory Visit | Attending: Neurology | Admitting: Neurology

## 2016-03-30 DIAGNOSIS — M5416 Radiculopathy, lumbar region: Secondary | ICD-10-CM

## 2016-03-30 DIAGNOSIS — M47817 Spondylosis without myelopathy or radiculopathy, lumbosacral region: Secondary | ICD-10-CM | POA: Diagnosis not present

## 2016-03-30 MED ORDER — IOPAMIDOL (ISOVUE-M 200) INJECTION 41%
1.0000 mL | Freq: Once | INTRAMUSCULAR | Status: AC
  Administered 2016-03-30: 1 mL via EPIDURAL

## 2016-03-30 MED ORDER — METHYLPREDNISOLONE ACETATE 40 MG/ML INJ SUSP (RADIOLOG
120.0000 mg | Freq: Once | INTRAMUSCULAR | Status: AC
  Administered 2016-03-30: 120 mg via EPIDURAL

## 2016-03-30 NOTE — Discharge Instructions (Signed)

## 2016-04-04 DIAGNOSIS — I10 Essential (primary) hypertension: Secondary | ICD-10-CM | POA: Diagnosis not present

## 2016-04-04 DIAGNOSIS — R7301 Impaired fasting glucose: Secondary | ICD-10-CM | POA: Diagnosis not present

## 2016-04-04 DIAGNOSIS — G894 Chronic pain syndrome: Secondary | ICD-10-CM | POA: Diagnosis not present

## 2016-04-04 DIAGNOSIS — F411 Generalized anxiety disorder: Secondary | ICD-10-CM | POA: Diagnosis not present

## 2016-04-20 DIAGNOSIS — M545 Low back pain: Secondary | ICD-10-CM | POA: Diagnosis not present

## 2016-04-20 DIAGNOSIS — G894 Chronic pain syndrome: Secondary | ICD-10-CM | POA: Diagnosis not present

## 2016-04-20 DIAGNOSIS — F17210 Nicotine dependence, cigarettes, uncomplicated: Secondary | ICD-10-CM | POA: Diagnosis not present

## 2016-04-20 DIAGNOSIS — M5137 Other intervertebral disc degeneration, lumbosacral region: Secondary | ICD-10-CM | POA: Diagnosis not present

## 2016-04-20 DIAGNOSIS — M47817 Spondylosis without myelopathy or radiculopathy, lumbosacral region: Secondary | ICD-10-CM | POA: Diagnosis not present

## 2016-04-20 DIAGNOSIS — Z79891 Long term (current) use of opiate analgesic: Secondary | ICD-10-CM | POA: Diagnosis not present

## 2016-04-20 DIAGNOSIS — Z716 Tobacco abuse counseling: Secondary | ICD-10-CM | POA: Diagnosis not present

## 2016-04-20 DIAGNOSIS — Z79899 Other long term (current) drug therapy: Secondary | ICD-10-CM | POA: Diagnosis not present

## 2016-05-04 ENCOUNTER — Telehealth: Payer: Self-pay | Admitting: Neurology

## 2016-05-04 DIAGNOSIS — M5137 Other intervertebral disc degeneration, lumbosacral region: Secondary | ICD-10-CM | POA: Diagnosis not present

## 2016-05-04 DIAGNOSIS — Z79899 Other long term (current) drug therapy: Secondary | ICD-10-CM | POA: Diagnosis not present

## 2016-05-04 DIAGNOSIS — Z79891 Long term (current) use of opiate analgesic: Secondary | ICD-10-CM | POA: Diagnosis not present

## 2016-05-04 DIAGNOSIS — G894 Chronic pain syndrome: Secondary | ICD-10-CM | POA: Diagnosis not present

## 2016-05-04 DIAGNOSIS — M545 Low back pain: Secondary | ICD-10-CM | POA: Diagnosis not present

## 2016-05-04 DIAGNOSIS — M47817 Spondylosis without myelopathy or radiculopathy, lumbosacral region: Secondary | ICD-10-CM | POA: Diagnosis not present

## 2016-05-04 NOTE — Telephone Encounter (Signed)
Dr Ahern- please advise, thank you 

## 2016-05-04 NOTE — Telephone Encounter (Signed)
Let patient know she has to ask her primary care to refer her per our new office policy, sorry. thanks

## 2016-05-04 NOTE — Telephone Encounter (Signed)
Patient reports she does not like the provider at the [facility]/[place]ern[place]. He has changed all of her medications. She has an appt with him at 3pm today. She would like Dr Lucia GaskinsAhern to refer her to another pain management. She is going to keep appt today with him but she is requesting to speak with Dr Lucia GaskinsAhern today before her appt. Operator did not schedule appt did not know protocol for this. Please call.  Advised that the nurse would call.

## 2016-05-04 NOTE — Telephone Encounter (Signed)
Called and spoke to pt and relayed Dr Lucia GaskinsAhern message below. She verbalized understanding.

## 2016-05-18 DIAGNOSIS — Z79899 Other long term (current) drug therapy: Secondary | ICD-10-CM | POA: Diagnosis not present

## 2016-05-18 DIAGNOSIS — G894 Chronic pain syndrome: Secondary | ICD-10-CM | POA: Diagnosis not present

## 2016-05-18 DIAGNOSIS — M79606 Pain in leg, unspecified: Secondary | ICD-10-CM | POA: Diagnosis not present

## 2016-05-18 DIAGNOSIS — Z79891 Long term (current) use of opiate analgesic: Secondary | ICD-10-CM | POA: Diagnosis not present

## 2016-05-18 DIAGNOSIS — M47817 Spondylosis without myelopathy or radiculopathy, lumbosacral region: Secondary | ICD-10-CM | POA: Diagnosis not present

## 2016-05-18 DIAGNOSIS — M5137 Other intervertebral disc degeneration, lumbosacral region: Secondary | ICD-10-CM | POA: Diagnosis not present

## 2016-05-18 DIAGNOSIS — Z1231 Encounter for screening mammogram for malignant neoplasm of breast: Secondary | ICD-10-CM | POA: Diagnosis not present

## 2016-05-19 DIAGNOSIS — M5417 Radiculopathy, lumbosacral region: Secondary | ICD-10-CM | POA: Diagnosis not present

## 2016-05-25 ENCOUNTER — Ambulatory Visit (INDEPENDENT_AMBULATORY_CARE_PROVIDER_SITE_OTHER): Payer: BLUE CROSS/BLUE SHIELD | Admitting: Obstetrics & Gynecology

## 2016-05-25 ENCOUNTER — Encounter: Payer: Self-pay | Admitting: Obstetrics & Gynecology

## 2016-05-25 VITALS — BP 120/80 | HR 76 | Ht 66.0 in | Wt 183.0 lb

## 2016-05-25 DIAGNOSIS — N8110 Cystocele, unspecified: Secondary | ICD-10-CM

## 2016-05-25 DIAGNOSIS — IMO0001 Reserved for inherently not codable concepts without codable children: Secondary | ICD-10-CM

## 2016-05-25 DIAGNOSIS — IMO0002 Reserved for concepts with insufficient information to code with codable children: Principal | ICD-10-CM

## 2016-05-25 MED ORDER — ESTRADIOL 0.1 MG/GM VA CREA: 1.0000 | TOPICAL_CREAM | Freq: Every day | VAGINAL | Status: DC

## 2016-05-25 NOTE — Progress Notes (Signed)
Patient ID: Melanie Avila, female   DOB: 1960-12-31, 55 y.o.   MRN: [id number]   Chief Complaint  Patient presents with  . new gyn visit    think bladder drop     HPI:   55 y.o. No obstetric history on file. No LMP recorded. Patient is postmenopausal.   Location:  Vaginal . Quality:  pressure. Severity:  moderate. Timing:  Started 3 days ago. Duration:  At work. Context:  Working, stooping, squatting, lifting. Modifying factors:  work Signs/Symptoms:    Loses a couple of drops of urine, rarely   Current outpatient prescriptions:  .  ALPRAZolam (XANAX) 1 MG tablet, Take 1 mg by mouth 2 (two) times daily as needed for anxiety. , Disp: , Rfl:  .  amLODipine (NORVASC) 5 MG tablet, Take 5 mg by mouth daily., Disp: , Rfl:  .  atorvastatin (LIPITOR) 20 MG tablet, Take 20 mg by mouth daily. , Disp: , Rfl:  .  meloxicam (MOBIC) 7.5 MG tablet, Take 7.5 mg by mouth daily., Disp: , Rfl:  .  OxyCODONE ER (XTAMPZA ER) 9 MG C12A, Take by mouth., Disp: , Rfl:  .  sertraline (ZOLOFT) 100 MG tablet, Take 100 mg by mouth daily. , Disp: , Rfl:  .  telmisartan-hydrochlorothiazide (MICARDIS HCT) 80-25 MG per tablet, Take 1 tablet by mouth daily., Disp: , Rfl:  .  estradiol (ESTRACE VAGINAL) 0.1 MG/GM vaginal cream, Place 1 Applicatorful vaginally at bedtime., Disp: 42.5 g, Rfl: 12  Problem Pertinent ROS:     No burning with urination, frequency or urgency No nausea, vomiting or diarrhea Nor fever chills or other constitutional symptoms     Extended ROS:        PMFSH:             Past Medical History  Diagnosis Date  . Fibromyalgia   . Anemia   . Anxiety   . GERD (gastroesophageal reflux disease)   . Hyperlipidemia   . Hypertension   . Depression   . DDD (degenerative disc disease)   . Complication of anesthesia     last shoulder surg 2010-dsc-2 hr after block to surg-neck swelled-hard to tube-stayed RCC  . PONV (postoperative nausea and vomiting)     Past Surgical History   Procedure Laterality Date  . Carpal tunnel release    . Shoulder surgery    . Tubal ligation    . Back surgery  89,02    lumbar x2  . Shoulder arthroscopy with subacromial decompression  10/23/2012    Procedure: SHOULDER ARTHROSCOPY WITH SUBACROMIAL DECOMPRESSION;  Surgeon: Wyn Forster., MD;  Location: [facility];  Service: Orthopedics;  Laterality: Left;  LEFT SHOULDER  ARTHROSCOPY WITH SUBACROMIAL DECOMPRESSION, DISTAL CLAVICLE RESECTION, ARTHROSCOPIC SUBSCAPULARIS REPAIR AND OPEN REPAIR OF SUPRASPINATOUS TENDON  . Colonoscopy N/A 12/09/2015    Procedure: COLONOSCOPY;  Surgeon: Malissa Hippo, MD;  Location: [facility];  Service: Endoscopy;  Laterality: N/A;  830    OB History    No data available      Allergies  Allergen Reactions  . Cymbalta [Duloxetine Hcl]     Suicidal thoughts  . Effexor [Venlafaxine Hydrochloride] Other (See Comments)    Suicidal thoughts  . Sulfa Antibiotics Swelling  . Lyrica [Pregabalin] Other (See Comments)    dizziness    Social History   Social History  . Marital Status: Married    Spouse Name: Donnal Debar   . Number of Children: 2  . Years of  Education: 12   Social History Main Topics  . Smoking status: Current Every Day Smoker -- 1.00 packs/day for 35 years    Types: Cigarettes  . Smokeless tobacco: Never Used  . Alcohol Use: No  . Drug Use: No  . Sexual Activity: Not Asked   Other Topics Concern  . None   Social History Narrative   Patient lives at home with husband and son   Patient has 2 children    Patient has a high school education    Patient works has [employer]   Patient is right handed    Caffeine use: 1/2-1 cup coffee occasionally    Drinks tea occas    Family History  Problem Relation Age of Onset  . Alzheimer's disease Mother   . Prostate cancer Father   . Leukemia Father   . Heart attack Maternal Aunt   . Heart attack Maternal Uncle   . Diabetes Cousin   . Diabetes Cousin       Examination:  Vitals:  Blood pressure 120/80, pulse 76, height 5\' 6"  (1.676 m), weight 183 lb (83.008 kg).    Physical Examination:     General Appearance:  awake, alert, oriented, in no acute distress  Vulva:  normal appearing vulva with no masses, tenderness or lesions Vagina:  normal mucosa, no discharge, Grade 1-2 cystocoel, not much movement Cervix:  no lesions Uterus:  normal size, contour, position, consistency, mobility, non-tender Adnexa: ovaries:present,  normal adnexa in size, nontender and no masses  No results found for this or any previous visit (from the past 72 hour(s)).   DATA orders and reviews: Labs were not ordered today:   Imaging studies were not ordered today:    Lab tests were not reviewed today:    Imaging studies were not reviewed today:    I did not independently review/view images, tracing or specimen(not simply the report) myself.    Prescription Drug Management:   Prescriptions prescribed this encounter:    Meds ordered this encounter  Medications  . OxyCODONE ER (XTAMPZA ER) 9 MG C12A    Sig: Take by mouth.  . meloxicam (MOBIC) 7.5 MG tablet    Sig: Take 7.5 mg by mouth daily.  Marland Kitchen. estradiol (ESTRACE VAGINAL) 0.1 MG/GM vaginal cream    Sig: Place 1 Applicatorful vaginally at bedtime.    Dispense:  42.5 g    Refill:  12    Renewed Prescriptions:    Current prescription changes:     Impression/Plan(Problem Based):  1. Cystocele, grade 1- 2, not much provocative movement     Follow Up:   Return in about 3 months (around 08/25/2016) for Follow up, with Dr Despina HiddenEure.        All questions were answered.

## 2016-05-30 ENCOUNTER — Telehealth: Payer: Self-pay | Admitting: *Deleted

## 2016-05-30 NOTE — Telephone Encounter (Signed)
Pt states saw Dr.Eure on 05/25/2016, was given a RX for Estradiol cream and told to perform Kegels exercises. Pt states since that time she has had to leave work early due to pain and discomfort due to the Cystocele. Pt states her employer suggested she get a note from provider for a work leave until she can start taking the cream/Kegel's and see improvement.   Pt also states the Cream cost her $100.00 out of pocket and did not know how long she would be able to afford. Please advise.

## 2016-05-31 ENCOUNTER — Telehealth: Payer: Self-pay | Admitting: Obstetrics & Gynecology

## 2016-05-31 MED ORDER — ESTROGENS, CONJUGATED 0.625 MG/GM VA CREA: 1.0000 | TOPICAL_CREAM | Freq: Every day | VAGINAL | Status: DC

## 2016-05-31 NOTE — Telephone Encounter (Signed)
Pt informed new Rx - Premarin e-scribed due to cost of Estrace. Work note faxed per pt request to [phone] Attn: Clent JacksKathy Wall.

## 2016-06-01 DIAGNOSIS — M47817 Spondylosis without myelopathy or radiculopathy, lumbosacral region: Secondary | ICD-10-CM | POA: Diagnosis not present

## 2016-06-01 DIAGNOSIS — M5137 Other intervertebral disc degeneration, lumbosacral region: Secondary | ICD-10-CM | POA: Diagnosis not present

## 2016-06-15 DIAGNOSIS — M5137 Other intervertebral disc degeneration, lumbosacral region: Secondary | ICD-10-CM | POA: Diagnosis not present

## 2016-06-15 DIAGNOSIS — Z79899 Other long term (current) drug therapy: Secondary | ICD-10-CM | POA: Diagnosis not present

## 2016-06-15 DIAGNOSIS — M79606 Pain in leg, unspecified: Secondary | ICD-10-CM | POA: Diagnosis not present

## 2016-06-15 DIAGNOSIS — M47817 Spondylosis without myelopathy or radiculopathy, lumbosacral region: Secondary | ICD-10-CM | POA: Diagnosis not present

## 2016-06-15 DIAGNOSIS — Z79891 Long term (current) use of opiate analgesic: Secondary | ICD-10-CM | POA: Diagnosis not present

## 2016-06-15 DIAGNOSIS — G894 Chronic pain syndrome: Secondary | ICD-10-CM | POA: Diagnosis not present

## 2016-06-20 DIAGNOSIS — E669 Obesity, unspecified: Secondary | ICD-10-CM | POA: Diagnosis not present

## 2016-06-20 DIAGNOSIS — Z716 Tobacco abuse counseling: Secondary | ICD-10-CM | POA: Diagnosis not present

## 2016-06-20 DIAGNOSIS — E785 Hyperlipidemia, unspecified: Secondary | ICD-10-CM | POA: Diagnosis not present

## 2016-06-20 DIAGNOSIS — F17210 Nicotine dependence, cigarettes, uncomplicated: Secondary | ICD-10-CM | POA: Diagnosis not present

## 2016-06-29 DIAGNOSIS — M5137 Other intervertebral disc degeneration, lumbosacral region: Secondary | ICD-10-CM | POA: Diagnosis not present

## 2016-06-29 DIAGNOSIS — M545 Low back pain: Secondary | ICD-10-CM | POA: Diagnosis not present

## 2016-06-29 DIAGNOSIS — M47817 Spondylosis without myelopathy or radiculopathy, lumbosacral region: Secondary | ICD-10-CM | POA: Diagnosis not present

## 2016-06-29 DIAGNOSIS — M79606 Pain in leg, unspecified: Secondary | ICD-10-CM | POA: Diagnosis not present

## 2016-07-13 DIAGNOSIS — Z79891 Long term (current) use of opiate analgesic: Secondary | ICD-10-CM | POA: Diagnosis not present

## 2016-07-13 DIAGNOSIS — Z79899 Other long term (current) drug therapy: Secondary | ICD-10-CM | POA: Diagnosis not present

## 2016-07-13 DIAGNOSIS — M47817 Spondylosis without myelopathy or radiculopathy, lumbosacral region: Secondary | ICD-10-CM | POA: Diagnosis not present

## 2016-07-13 DIAGNOSIS — M5137 Other intervertebral disc degeneration, lumbosacral region: Secondary | ICD-10-CM | POA: Diagnosis not present

## 2016-07-13 DIAGNOSIS — M79606 Pain in leg, unspecified: Secondary | ICD-10-CM | POA: Diagnosis not present

## 2016-07-13 DIAGNOSIS — G894 Chronic pain syndrome: Secondary | ICD-10-CM | POA: Diagnosis not present

## 2016-07-27 DIAGNOSIS — M5137 Other intervertebral disc degeneration, lumbosacral region: Secondary | ICD-10-CM | POA: Diagnosis not present

## 2016-07-27 DIAGNOSIS — M545 Low back pain: Secondary | ICD-10-CM | POA: Diagnosis not present

## 2016-07-27 DIAGNOSIS — M47817 Spondylosis without myelopathy or radiculopathy, lumbosacral region: Secondary | ICD-10-CM | POA: Diagnosis not present

## 2016-08-11 DIAGNOSIS — M5137 Other intervertebral disc degeneration, lumbosacral region: Secondary | ICD-10-CM | POA: Diagnosis not present

## 2016-08-11 DIAGNOSIS — M47817 Spondylosis without myelopathy or radiculopathy, lumbosacral region: Secondary | ICD-10-CM | POA: Diagnosis not present

## 2016-08-11 DIAGNOSIS — Z79899 Other long term (current) drug therapy: Secondary | ICD-10-CM | POA: Diagnosis not present

## 2016-08-11 DIAGNOSIS — M79606 Pain in leg, unspecified: Secondary | ICD-10-CM | POA: Diagnosis not present

## 2016-08-11 DIAGNOSIS — G894 Chronic pain syndrome: Secondary | ICD-10-CM | POA: Diagnosis not present

## 2016-08-11 DIAGNOSIS — Z79891 Long term (current) use of opiate analgesic: Secondary | ICD-10-CM | POA: Diagnosis not present

## 2016-08-17 DIAGNOSIS — J40 Bronchitis, not specified as acute or chronic: Secondary | ICD-10-CM | POA: Diagnosis not present

## 2016-08-17 DIAGNOSIS — R0602 Shortness of breath: Secondary | ICD-10-CM | POA: Diagnosis not present

## 2016-08-17 DIAGNOSIS — R05 Cough: Secondary | ICD-10-CM | POA: Diagnosis not present

## 2016-08-24 ENCOUNTER — Ambulatory Visit: Payer: BLUE CROSS/BLUE SHIELD | Admitting: Obstetrics & Gynecology

## 2016-08-24 DIAGNOSIS — M47817 Spondylosis without myelopathy or radiculopathy, lumbosacral region: Secondary | ICD-10-CM | POA: Diagnosis not present

## 2016-08-24 DIAGNOSIS — M5137 Other intervertebral disc degeneration, lumbosacral region: Secondary | ICD-10-CM | POA: Diagnosis not present

## 2016-08-30 ENCOUNTER — Emergency Department (HOSPITAL_COMMUNITY): Payer: BLUE CROSS/BLUE SHIELD

## 2016-08-30 ENCOUNTER — Emergency Department (HOSPITAL_COMMUNITY)
Admission: EM | Admit: 2016-08-30 | Discharge: 2016-08-30 | Disposition: A | Discharge status: Home / Self Care | Payer: BLUE CROSS/BLUE SHIELD | Attending: Emergency Medicine | Admitting: Emergency Medicine

## 2016-08-30 ENCOUNTER — Encounter (HOSPITAL_COMMUNITY): Payer: Self-pay | Admitting: Emergency Medicine

## 2016-08-30 DIAGNOSIS — F17210 Nicotine dependence, cigarettes, uncomplicated: Secondary | ICD-10-CM | POA: Insufficient documentation

## 2016-08-30 DIAGNOSIS — M5442 Lumbago with sciatica, left side: Secondary | ICD-10-CM | POA: Diagnosis not present

## 2016-08-30 DIAGNOSIS — Z79899 Other long term (current) drug therapy: Secondary | ICD-10-CM | POA: Diagnosis not present

## 2016-08-30 DIAGNOSIS — M5136 Other intervertebral disc degeneration, lumbar region: Secondary | ICD-10-CM | POA: Diagnosis not present

## 2016-08-30 DIAGNOSIS — I10 Essential (primary) hypertension: Secondary | ICD-10-CM | POA: Insufficient documentation

## 2016-08-30 DIAGNOSIS — M545 Low back pain: Secondary | ICD-10-CM | POA: Diagnosis present

## 2016-08-30 DIAGNOSIS — M25552 Pain in left hip: Secondary | ICD-10-CM | POA: Diagnosis not present

## 2016-08-30 DIAGNOSIS — R7301 Impaired fasting glucose: Secondary | ICD-10-CM | POA: Diagnosis not present

## 2016-08-30 MED ORDER — PREDNISONE 10 MG PO TABS: ORAL_TABLET | ORAL | 0 refills | Status: DC

## 2016-08-30 NOTE — ED Triage Notes (Signed)
Pt reports falling 2 weeks ago "without hitting the ground" after dog jumped up on her.  Pt having left lower back pain at this time.

## 2016-08-30 NOTE — Discharge Instructions (Signed)
Follow-up with your pain management provider.  °

## 2016-08-30 NOTE — ED Provider Notes (Signed)
[facility] Provider Note   CSN: [id number] Arrival date & time: 08/30/16  1138     History   Chief Complaint Chief Complaint  Patient presents with  . Back Pain    HPI Melanie Lecheronya T Bertholf is a 55 y.o. female.  HPI   Melanie Avila is a 55 y.o. female who presents to the Emergency Department complaining of left low back pain for 2 weeks.  She reports being pushed by her dog and falling against a wall and has worsening back pain since then.  She describes an aching pain that radiates into her left buttocks, hip and left leg to the level of her knee.  Pain is worse with standing and walking. She states that her job requires continuous walking and she is concerned that it may be irritating her back.  She states that she sees pain management and takes her medications as directed, but she states this pain seems different. She denies amount of pain, fever or chills, urinary or bowel changes, numbness or weakness to the lower extremities    Past Medical History:  Diagnosis Date  . Anemia   . Anxiety   . Complication of anesthesia    last shoulder surg 2010-dsc-2 hr after block to surg-neck swelled-hard to tube-stayed RCC  . DDD (degenerative disc disease)   . Depression   . Fibromyalgia   . GERD (gastroesophageal reflux disease)   . Hyperlipidemia   . Hypertension   . PONV (postoperative nausea and vomiting)     Patient Active Problem List   Diagnosis Date Noted  . Left leg pain 11/22/2014  . Fibromyalgia 01/30/2012  . Depression with anxiety 01/30/2012  . Lumbar post-laminectomy syndrome 01/30/2012  . Thoracic or lumbosacral neuritis or radiculitis, unspecified 01/30/2012    Past Surgical History:  Procedure Laterality Date  . BACK SURGERY  89,02   lumbar x2  . CARPAL TUNNEL RELEASE    . COLONOSCOPY N/A 12/09/2015   Procedure: COLONOSCOPY;  Surgeon: Malissa HippoNajeeb U Rehman, MD;  Location: [facility];  Service: Endoscopy;  Laterality: N/A;  830  . SHOULDER ARTHROSCOPY WITH  SUBACROMIAL DECOMPRESSION  10/23/2012   Procedure: SHOULDER ARTHROSCOPY WITH SUBACROMIAL DECOMPRESSION;  Surgeon: Wyn Forsterobert V Sypher Jr., MD;  Location:  [facility];  Service: Orthopedics;  Laterality: Left;  LEFT SHOULDER  ARTHROSCOPY WITH SUBACROMIAL DECOMPRESSION, DISTAL CLAVICLE RESECTION, ARTHROSCOPIC SUBSCAPULARIS REPAIR AND OPEN REPAIR OF SUPRASPINATOUS TENDON  . SHOULDER SURGERY    . TUBAL LIGATION      OB History    No data available       Home Medications    Prior to Admission medications   Medication Sig Start Date End Date Taking? Authorizing Provider  ALPRAZolam Prudy Feeler(XANAX) 1 MG tablet Take 1 mg by mouth 2 (two) times daily as needed for anxiety.     Historical Provider, MD  amLODipine (NORVASC) 5 MG tablet Take 5 mg by mouth daily. 02/24/15   Historical Provider, MD  atorvastatin (LIPITOR) 20 MG tablet Take 20 mg by mouth daily.  08/24/14   Historical Provider, MD  conjugated estrogens (PREMARIN) vaginal cream Place 1 Applicatorful vaginally daily. 05/31/16   Lazaro ArmsLuther H Eure, MD  estradiol (ESTRACE VAGINAL) 0.1 MG/GM vaginal cream Place 1 Applicatorful vaginally at bedtime. 05/25/16   Lazaro ArmsLuther H Eure, MD  meloxicam (MOBIC) 7.5 MG tablet Take 7.5 mg by mouth daily.    Historical Provider, MD  OxyCODONE ER (XTAMPZA ER) 9 MG C12A Take by mouth.    Historical Provider, MD  sertraline (ZOLOFT) 100 MG tablet Take 100 mg by mouth daily.  10/13/14   Historical Provider, MD  telmisartan-hydrochlorothiazide (MICARDIS HCT) 80-25 MG per tablet Take 1 tablet by mouth daily.    Historical Provider, MD    Family History Family History  Problem Relation Age of Onset  . Alzheimer's disease Mother   . Prostate cancer Father   . Leukemia Father   . Heart attack Maternal Aunt   . Heart attack Maternal Uncle   . Diabetes Cousin   . Diabetes Cousin     Social History Social History  Substance Use Topics  . Smoking status: Current Every Day Smoker    Packs/day: 1.00    Years: 35.00      Types: Cigarettes  . Smokeless tobacco: Never Used  . Alcohol use No     Allergies   Cymbalta [duloxetine hcl]; Effexor [venlafaxine hydrochloride]; Sulfa antibiotics; and Lyrica [pregabalin]   Review of Systems Review of Systems  Constitutional: Negative for fever.  Respiratory: Negative for shortness of breath.   Gastrointestinal: Negative for abdominal pain, constipation and vomiting.  Genitourinary: Negative for decreased urine volume, difficulty urinating, dysuria, flank pain and hematuria.  Musculoskeletal: Positive for back pain. Negative for joint swelling.  Skin: Negative for rash.  Neurological: Negative for weakness and numbness (pins and needles sensation to left leg).  All other systems reviewed and are negative.    Physical Exam Updated Vital Signs BP 157/86 (BP Location: Left Arm)   Pulse 97   Temp 98.3 F (36.8 C) (Oral)   Resp 18   Ht 5' 5.5" (1.664 m)   Wt 83 kg   SpO2 100%   BMI 29.99 kg/m   Physical Exam  Constitutional: She is oriented to person, place, and time. She appears well-developed and well-nourished. No distress.  HENT:  Head: Normocephalic and atraumatic.  Neck: Normal range of motion. Neck supple.  Cardiovascular: Normal rate, regular rhythm and intact distal pulses.   No murmur heard. Pulmonary/Chest: Effort normal and breath sounds normal. No respiratory distress.  Abdominal: Soft. She exhibits no distension. There is no tenderness.  Musculoskeletal: She exhibits tenderness. She exhibits no edema.       Lumbar back: She exhibits tenderness and pain. She exhibits normal range of motion, no swelling, no deformity, no laceration and normal pulse.  ttp of the lower lumbar spine and left paraspinal muscles and SI joint.  DP pulses are brisk and symmetrical.  Distal sensation intact.  Pt has 5/5 strength against resistance of bilateral lower extremities.     Neurological: She is alert and oriented to person, place, and time. She has  normal strength. No sensory deficit. She exhibits normal muscle tone. Coordination and gait normal.  Skin: Skin is warm and dry. No rash noted.  Nursing note and vitals reviewed.    ED Treatments / Results  Labs (all labs ordered are listed, but only abnormal results are displayed) Labs Reviewed - No data to display  EKG  EKG Interpretation None       Radiology Dg Lumbar Spine Complete  Result Date: 08/30/2016 CLINICAL DATA:  55 year old presenting with acute onset of low back pain and left hip pain which began last night. No recent injuries. EXAM: LUMBAR SPINE - COMPLETE 4+ VIEW COMPARISON:  Lumbar spine x-rays 08/23/2011. FINDINGS: Five non-rib-bearing lumbar vertebrae with anatomic alignment. No fractures. Severe disc space narrowing and associated endplate hypertrophic changes at L5-S1, progressive since 2012. Remaining disc spaces well-preserved. Schmorl's nodes involving the endplates of  the T12 through L4 vertebrae. Lower thoracic spondylosis. No pars defects. Moderate right facet degenerative changes at L4-5 and L5-S1. Aortoiliac atherosclerosis without evidence of aneurysm. IMPRESSION: 1. No acute osseous abnormality. 2. Severe degenerative disc disease and spondylosis at L5-S1, progressive since 2012. 3. Lower thoracic spondylosis. 4. Aortoiliac atherosclerosis. Electronically Signed   By: Hulan Saas M.D.   On: 08/30/2016 12:38   Dg Hip Unilat W Or Wo Pelvis 2-3 Views Left  Result Date: 08/30/2016 CLINICAL DATA:  55 year old presenting with acute onset of low back pain and left hip pain which began last night. No recent injuries. EXAM: DG HIP (WITH OR WITHOUT PELVIS) 2-3V LEFT COMPARISON:  None. FINDINGS: No evidence of acute or subacute fracture or dislocation. Well-preserved joint space. Well-preserved bone mineral density. Included [facility] pelvis demonstrates a normal-appearing contralateral right hip. Sacroiliac joints and symphysis pubis intact. No intrinsic osseous  abnormality. IMPRESSION: Normal examination. Electronically Signed   By: Hulan Saas M.D.   On: 08/30/2016 12:39    Procedures Procedures (including critical care time)  Medications Ordered in ED Medications - No data to display   Initial Impression / Assessment and Plan / ED Course  I have reviewed the triage vital signs and the nursing notes.  Pertinent labs & imaging results that were available during my care of the patient were reviewed by me and considered in my medical decision making (see chart for details).  Clinical Course   Pt is well appearing.  Ambulates with a steady gait.  No focal neuro deficits on exam.  No concerning sx's for emergent neurological or infectious process.    Pt has hx of chronic back pain. Has pain medications at home.  Agrees to arrange pain management f/u   Rx for prednisone taper   Final Clinical Impressions(s) / ED Diagnoses   Final diagnoses:  Left-sided low back pain with left-sided sciatica    New Prescriptions New Prescriptions   No medications on file     Pauline Aus, Cordelia Poche 08/30/16 1632    Blane Ohara, MD 09/03/16 [phone]

## 2016-09-02 DIAGNOSIS — I10 Essential (primary) hypertension: Secondary | ICD-10-CM | POA: Diagnosis not present

## 2016-09-02 DIAGNOSIS — Z23 Encounter for immunization: Secondary | ICD-10-CM | POA: Diagnosis not present

## 2016-09-02 DIAGNOSIS — Z79899 Other long term (current) drug therapy: Secondary | ICD-10-CM | POA: Diagnosis not present

## 2016-09-02 DIAGNOSIS — M79606 Pain in leg, unspecified: Secondary | ICD-10-CM | POA: Diagnosis not present

## 2016-09-02 DIAGNOSIS — Z79891 Long term (current) use of opiate analgesic: Secondary | ICD-10-CM | POA: Diagnosis not present

## 2016-09-02 DIAGNOSIS — M47817 Spondylosis without myelopathy or radiculopathy, lumbosacral region: Secondary | ICD-10-CM | POA: Diagnosis not present

## 2016-09-02 DIAGNOSIS — G894 Chronic pain syndrome: Secondary | ICD-10-CM | POA: Diagnosis not present

## 2016-09-02 DIAGNOSIS — E782 Mixed hyperlipidemia: Secondary | ICD-10-CM | POA: Diagnosis not present

## 2016-09-02 DIAGNOSIS — M5137 Other intervertebral disc degeneration, lumbosacral region: Secondary | ICD-10-CM | POA: Diagnosis not present

## 2016-09-02 DIAGNOSIS — R7301 Impaired fasting glucose: Secondary | ICD-10-CM | POA: Diagnosis not present

## 2016-09-23 DIAGNOSIS — Z716 Tobacco abuse counseling: Secondary | ICD-10-CM | POA: Diagnosis not present

## 2016-09-23 DIAGNOSIS — M5416 Radiculopathy, lumbar region: Secondary | ICD-10-CM | POA: Diagnosis not present

## 2016-09-30 DIAGNOSIS — M79606 Pain in leg, unspecified: Secondary | ICD-10-CM | POA: Diagnosis not present

## 2016-09-30 DIAGNOSIS — M5137 Other intervertebral disc degeneration, lumbosacral region: Secondary | ICD-10-CM | POA: Diagnosis not present

## 2016-09-30 DIAGNOSIS — M47817 Spondylosis without myelopathy or radiculopathy, lumbosacral region: Secondary | ICD-10-CM | POA: Diagnosis not present

## 2016-09-30 DIAGNOSIS — G894 Chronic pain syndrome: Secondary | ICD-10-CM | POA: Diagnosis not present

## 2016-09-30 DIAGNOSIS — Z79891 Long term (current) use of opiate analgesic: Secondary | ICD-10-CM | POA: Diagnosis not present

## 2016-09-30 DIAGNOSIS — Z79899 Other long term (current) drug therapy: Secondary | ICD-10-CM | POA: Diagnosis not present

## 2016-10-04 DIAGNOSIS — M545 Low back pain: Secondary | ICD-10-CM | POA: Diagnosis not present

## 2016-10-07 DIAGNOSIS — M5416 Radiculopathy, lumbar region: Secondary | ICD-10-CM | POA: Diagnosis not present

## 2016-10-13 ENCOUNTER — Other Ambulatory Visit: Payer: Self-pay | Admitting: Orthopedic Surgery

## 2016-10-14 DIAGNOSIS — E782 Mixed hyperlipidemia: Secondary | ICD-10-CM | POA: Diagnosis not present

## 2016-10-25 DIAGNOSIS — R42 Dizziness and giddiness: Secondary | ICD-10-CM | POA: Diagnosis not present

## 2016-10-25 DIAGNOSIS — H81399 Other peripheral vertigo, unspecified ear: Secondary | ICD-10-CM | POA: Diagnosis not present

## 2016-10-25 DIAGNOSIS — M545 Low back pain: Secondary | ICD-10-CM | POA: Diagnosis not present

## 2016-10-25 DIAGNOSIS — I959 Hypotension, unspecified: Secondary | ICD-10-CM | POA: Diagnosis not present

## 2016-10-31 ENCOUNTER — Ambulatory Visit (HOSPITAL_COMMUNITY)
Admission: RE | Admit: 2016-10-31 | Discharge: 2016-10-31 | Disposition: A | Discharge status: Home / Self Care | Payer: BLUE CROSS/BLUE SHIELD | Source: Ambulatory Visit | Attending: Orthopedic Surgery | Admitting: Orthopedic Surgery

## 2016-10-31 ENCOUNTER — Encounter (HOSPITAL_COMMUNITY)
Admission: RE | Admit: 2016-10-31 | Discharge: 2016-10-31 | Disposition: A | Discharge status: Home / Self Care | Payer: BLUE CROSS/BLUE SHIELD | Source: Ambulatory Visit | Attending: Orthopedic Surgery | Admitting: Orthopedic Surgery

## 2016-10-31 ENCOUNTER — Other Ambulatory Visit (HOSPITAL_COMMUNITY): Payer: Self-pay | Admitting: *Deleted

## 2016-10-31 ENCOUNTER — Encounter (HOSPITAL_COMMUNITY): Payer: Self-pay

## 2016-10-31 DIAGNOSIS — G894 Chronic pain syndrome: Secondary | ICD-10-CM | POA: Diagnosis not present

## 2016-10-31 DIAGNOSIS — M961 Postlaminectomy syndrome, not elsewhere classified: Secondary | ICD-10-CM | POA: Diagnosis not present

## 2016-10-31 DIAGNOSIS — I10 Essential (primary) hypertension: Secondary | ICD-10-CM | POA: Insufficient documentation

## 2016-10-31 DIAGNOSIS — R011 Cardiac murmur, unspecified: Secondary | ICD-10-CM | POA: Insufficient documentation

## 2016-10-31 DIAGNOSIS — Z79891 Long term (current) use of opiate analgesic: Secondary | ICD-10-CM | POA: Diagnosis not present

## 2016-10-31 DIAGNOSIS — D649 Anemia, unspecified: Secondary | ICD-10-CM | POA: Diagnosis not present

## 2016-10-31 DIAGNOSIS — Z01810 Encounter for preprocedural cardiovascular examination: Secondary | ICD-10-CM | POA: Insufficient documentation

## 2016-10-31 DIAGNOSIS — M5116 Intervertebral disc disorders with radiculopathy, lumbar region: Secondary | ICD-10-CM | POA: Insufficient documentation

## 2016-10-31 DIAGNOSIS — Z79899 Other long term (current) drug therapy: Secondary | ICD-10-CM | POA: Diagnosis not present

## 2016-10-31 DIAGNOSIS — M79606 Pain in leg, unspecified: Secondary | ICD-10-CM | POA: Diagnosis not present

## 2016-10-31 DIAGNOSIS — Z01812 Encounter for preprocedural laboratory examination: Secondary | ICD-10-CM | POA: Diagnosis not present

## 2016-10-31 DIAGNOSIS — M79605 Pain in left leg: Secondary | ICD-10-CM | POA: Insufficient documentation

## 2016-10-31 DIAGNOSIS — Z01818 Encounter for other preprocedural examination: Secondary | ICD-10-CM

## 2016-10-31 DIAGNOSIS — M47817 Spondylosis without myelopathy or radiculopathy, lumbosacral region: Secondary | ICD-10-CM | POA: Diagnosis not present

## 2016-10-31 DIAGNOSIS — M797 Fibromyalgia: Secondary | ICD-10-CM | POA: Diagnosis not present

## 2016-10-31 DIAGNOSIS — F418 Other specified anxiety disorders: Secondary | ICD-10-CM | POA: Insufficient documentation

## 2016-10-31 DIAGNOSIS — E785 Hyperlipidemia, unspecified: Secondary | ICD-10-CM | POA: Insufficient documentation

## 2016-10-31 DIAGNOSIS — G709 Myoneural disorder, unspecified: Secondary | ICD-10-CM | POA: Insufficient documentation

## 2016-10-31 DIAGNOSIS — M5137 Other intervertebral disc degeneration, lumbosacral region: Secondary | ICD-10-CM | POA: Diagnosis not present

## 2016-10-31 HISTORY — DX: Myoneural disorder, unspecified: G70.9

## 2016-10-31 HISTORY — DX: Headache, unspecified: R51.9

## 2016-10-31 HISTORY — DX: Cardiac murmur, unspecified: R01.1

## 2016-10-31 HISTORY — DX: Headache: R51

## 2016-10-31 HISTORY — DX: Personal history of urinary calculi: Z87.442

## 2016-10-31 LAB — COMPREHENSIVE METABOLIC PANEL
ALBUMIN: 4.1 g/dL (ref 3.5–5.0)
ALT: 23 U/L (ref 14–54)
AST: 24 U/L (ref 15–41)
Alkaline Phosphatase: 84 U/L (ref 38–126)
Anion gap: 7 (ref 5–15)
BILIRUBIN TOTAL: 0.5 mg/dL (ref 0.3–1.2)
BUN: 7 mg/dL (ref 6–20)
CO2: 30 mmol/L (ref 22–32)
CREATININE: 0.82 mg/dL (ref 0.44–1.00)
Calcium: 10 mg/dL (ref 8.9–10.3)
Chloride: 100 mmol/L — ABNORMAL LOW (ref 101–111)
GFR calc Af Amer: >60 mL/min (ref >60)
GLUCOSE: 103 mg/dL — AB (ref 65–99)
POTASSIUM: 3.3 mmol/L — AB (ref 3.5–5.1)
Sodium: 137 mmol/L (ref 135–145)
TOTAL PROTEIN: 7.1 g/dL (ref 6.5–8.1)

## 2016-10-31 LAB — CBC WITH DIFFERENTIAL/PLATELET
BASOS ABS: 0.1 10*3/uL (ref 0.0–0.1)
BASOS PCT: 0 %
Eosinophils Absolute: 0.1 10*3/uL (ref 0.0–0.7)
Eosinophils Relative: 1 %
HEMATOCRIT: 41.7 % (ref 36.0–46.0)
HEMOGLOBIN: 13.8 g/dL (ref 12.0–15.0)
LYMPHS PCT: 38 %
Lymphs Abs: 4.8 10*3/uL — ABNORMAL HIGH (ref 0.7–4.0)
MCH: 30.9 pg (ref 26.0–34.0)
MCHC: 33.1 g/dL (ref 30.0–36.0)
MCV: 93.3 fL (ref 78.0–100.0)
MONO ABS: 0.9 10*3/uL (ref 0.1–1.0)
Monocytes Relative: 7 %
NEUTROS ABS: 6.9 10*3/uL (ref 1.7–7.7)
NEUTROS PCT: 54 %
Platelets: 288 10*3/uL (ref 150–400)
RBC: 4.47 MIL/uL (ref 3.87–5.11)
RDW: 12.5 % (ref 11.5–15.5)
WBC: 12.6 10*3/uL — AB (ref 4.0–10.5)

## 2016-10-31 LAB — TYPE AND SCREEN
ABO/RH(D): O POS
ANTIBODY SCREEN: NEGATIVE

## 2016-10-31 LAB — URINALYSIS, ROUTINE W REFLEX MICROSCOPIC
BILIRUBIN URINE: NEGATIVE
Glucose, UA: NEGATIVE mg/dL
Hgb urine dipstick: NEGATIVE
KETONES UR: NEGATIVE mg/dL
LEUKOCYTES UA: NEGATIVE
NITRITE: NEGATIVE
Protein, ur: NEGATIVE mg/dL
SPECIFIC GRAVITY, URINE: 1.017 (ref 1.005–1.030)
pH: 7 (ref 5.0–8.0)

## 2016-10-31 LAB — SURGICAL PCR SCREEN
MRSA, PCR: NEGATIVE
STAPHYLOCOCCUS AUREUS: NEGATIVE

## 2016-10-31 LAB — APTT: APTT: 29 s (ref 24–36)

## 2016-10-31 LAB — PROTIME-INR
INR: 0.92
Prothrombin Time: 12.3 seconds (ref 11.4–15.2)

## 2016-10-31 LAB — ABO/RH: ABO/RH(D): O POS

## 2016-10-31 NOTE — Pre-Procedure Instructions (Addendum)
Melanie Avila  10/31/2016     Your procedure is scheduled on Wednesday, November 02, 2016 at 11:55 AM.   [facility] Admitting Office at 9:00 AM.   Call this number if you have problems the morning of surgery: [phone]   Questions prior to day of surgery, please call [phone] between 8 & 4 PM.   Remember:  Do not eat food or drink liquids after midnight Tuesday, 11/01/16.  Take these medicines the morning of surgery with A SIP OF WATER: Amlodipine (Norvasc), Omeprazole (Prilosec), Oxycodone (Oxycontin), Sertraline (Zoloft), Alprazolam (Xanax) -if needed  Do not use NSAIDS (Ibuprofen, Aleve, etc) or Aspirin products prior to surgery.  Do NOT smoke 24 hours prior to surgery.   Do not wear jewelry, make-up or nail polish.  Do not wear lotions, powders, or perfumes.  Do not shave 48 hours prior to surgery.    Do not bring valuables to the hospital.  [facility] is not responsible for any belongings or valuables.  Contacts, dentures or bridgework may not be worn into surgery.  Leave your suitcase in the car.  After surgery it may be brought to your room.  For patients admitted to the hospital, discharge time will be determined by your treatment team.  Special instructions:  [facility] - Preparing for Surgery  Before surgery, you can play an important role.  Because skin is not sterile, your skin needs to be as free of germs as possible.  You can reduce the number of germs on you skin by washing with CHG (chlorahexidine gluconate) soap before surgery.  CHG is an antiseptic cleaner which kills germs and bonds with the skin to continue killing germs even after washing.  Please DO NOT use if you have an allergy to CHG or antibacterial soaps.  If your skin becomes reddened/irritated stop using the CHG and inform your nurse when you arrive at Short Stay.  Do not shave (including legs and underarms) for at least 48 hours prior to the first CHG  shower.  You may shave your face.  Please follow these instructions carefully:   1.  Shower with CHG Soap the night before surgery and the                    morning of Surgery.  2.  If you choose to wash your hair, wash your hair first as usual with your       normal shampoo.  3.  After you shampoo, rinse your hair and body thoroughly to remove the shampoo.  4.  Use CHG as you would any other liquid soap.  You can apply chg directly       to the skin and wash gently with scrungie or a clean washcloth.  5.  Apply the CHG Soap to your body ONLY FROM THE NECK DOWN.        Do not use on open wounds or open sores.  Avoid contact with your eyes, ears, mouth and genitals (private parts).  Wash genitals (private parts) with your normal soap.  6.  Wash thoroughly, paying special attention to the area where your surgery        will be performed.  7.  Thoroughly rinse your body with warm water from the neck down.  8.  DO NOT shower/wash with your normal soap after using and rinsing off       the CHG Soap.  9.  Pat yourself dry  with a clean towel.            10.  Wear clean pajamas.            11.  Place clean sheets on your bed the night of your first shower and do not        sleep with pets.  Day of Surgery  Do not apply any lotions the morning of surgery.  Please wear clean clothes to the hospital.   Please read over the fact sheets that you were given.

## 2016-11-01 NOTE — H&P (Signed)
PREOPERATIVE H&P  Chief Complaint: Low back pain, left leg pain  HPI: Melanie Avila is a 55 y.o. female who presents with ongoing pain in the low back and left leg s/p a previous L5/S1 decompression  MRI reveals a left L5/S1 HNP, contacting the left L5 and S1 nerves. She did gain temporary benefit with multiple previous left L5 selective nerve blocks.  Patient has failed multiple forms of conservative care and continues to have pain (see office notes for additional details regarding the patient's full course of treatment)  Past Medical History:  Diagnosis Date  . Anemia   . Anxiety   . Complication of anesthesia    last shoulder surg 2010-dsc-2 hr after block to surg-neck swelled-hard to tube-stayed RCC  . DDD (degenerative disc disease)   . Depression   . Fibromyalgia   . GERD (gastroesophageal reflux disease)   . Headache    migraines in her younger years  . Heart murmur    since birth and never has been a problem  . History of kidney stones    7 times  . Hyperlipidemia   . Hypertension   . Neuromuscular disorder (HCC)    nerve damage left leg  . PONV (postoperative nausea and vomiting)    shorter surgeries tends to have n/v.    Past Surgical History:  Procedure Laterality Date  . BACK SURGERY  89,02   lumbar x2  . CARPAL TUNNEL RELEASE Bilateral   . COLONOSCOPY N/A 12/09/2015   Procedure: COLONOSCOPY;  Surgeon: Malissa HippoNajeeb U Rehman, MD;  Location: [facility];  Service: Endoscopy;  Laterality: N/A;  830  . SHOULDER ARTHROSCOPY WITH SUBACROMIAL DECOMPRESSION  10/23/2012   Procedure: SHOULDER ARTHROSCOPY WITH SUBACROMIAL DECOMPRESSION;  Surgeon: Wyn Forsterobert V Sypher Jr., MD;  Location: [facility];  Service: Orthopedics;  Laterality: Left;  LEFT SHOULDER  ARTHROSCOPY WITH SUBACROMIAL DECOMPRESSION, DISTAL CLAVICLE RESECTION, ARTHROSCOPIC SUBSCAPULARIS REPAIR AND OPEN REPAIR OF SUPRASPINATOUS TENDON  . SHOULDER SURGERY Bilateral   . TUBAL LIGATION     Social  History   Social History  . Marital status: Married    Spouse name: Donnal DebarRandi   . Number of children: 2  . Years of education: 3512   Social History Main Topics  . Smoking status: Current Every Day Smoker    Packs/day: 1.00    Years: 35.00    Types: Cigarettes  . Smokeless tobacco: Never Used     Comment: down to 4 cigarettes (as of 10/31/16)  . Alcohol use No  . Drug use: No  . Sexual activity: Not on file   Other Topics Concern  . Not on file   Social History Narrative   Patient lives at home with husband and son   Patient has 2 children    Patient has a high school education    Patient works has [employer]   Patient is right handed    Caffeine use: 1/2-1 cup coffee occasionally    Drinks tea occas   Family History  Problem Relation Age of Onset  . Alzheimer's disease Mother   . Prostate cancer Father   . Leukemia Father   . Heart attack Maternal Aunt   . Heart attack Maternal Uncle   . Diabetes Cousin   . Diabetes Cousin    Allergies  Allergen Reactions  . Cymbalta [Duloxetine Hcl]     Suicidal thoughts  . Effexor [Venlafaxine Hydrochloride] Other (See Comments)    Suicidal thoughts  . Sulfa Antibiotics Swelling  .  Gabapentin     unknown  . Lyrica [Pregabalin] Other (See Comments)    dizziness  . Morphine And Related Other (See Comments)    Makes her constipated and does not help the pain   Prior to Admission medications   Medication Sig Start Date End Date Taking? Authorizing Provider  ALPRAZolam Prudy Feeler(XANAX) 1 MG tablet Take 1 mg by mouth 2 (two) times daily as needed for anxiety.    Yes Historical Provider, MD  amLODipine (NORVASC) 5 MG tablet Take 5 mg by mouth daily. 02/24/15  Yes Historical Provider, MD  atorvastatin (LIPITOR) 20 MG tablet Take 20 mg by mouth daily.  08/24/14  Yes Historical Provider, MD  carisoprodol (SOMA) 350 MG tablet Take 350 mg by mouth 4 (four) times daily as needed for muscle spasms.   Yes Historical Provider, MD  ergocalciferol (VITAMIN  D2) 50000 units capsule Take 50,000 Units by mouth once a week.   Yes Historical Provider, MD  HYDROcodone-acetaminophen (NORCO) 10-325 MG tablet Take 1 tablet by mouth every 6 (six) hours as needed for moderate pain.    Yes Historical Provider, MD  omeprazole (PRILOSEC) 20 MG capsule Take 20 mg by mouth daily.   Yes Historical Provider, MD  oxyCODONE (OXYCONTIN) 20 mg 12 hr tablet Take 20 mg by mouth every 12 (twelve) hours.   Yes Historical Provider, MD  sertraline (ZOLOFT) 100 MG tablet Take 100 mg by mouth daily.  10/13/14  Yes Historical Provider, MD  telmisartan-hydrochlorothiazide (MICARDIS HCT) 80-25 MG per tablet Take 1 tablet by mouth daily.   Yes Historical Provider, MD  conjugated estrogens (PREMARIN) vaginal cream Place 1 Applicatorful vaginally daily. Patient not taking: Reported on 08/30/2016 05/31/16   Lazaro ArmsLuther H Eure, MD  estradiol (ESTRACE VAGINAL) 0.1 MG/GM vaginal cream Place 1 Applicatorful vaginally at bedtime. Patient not taking: Reported on 08/30/2016 05/25/16   Lazaro ArmsLuther H Eure, MD  predniSONE (DELTASONE) 10 MG tablet Take 6 tablets day one, 5 tablets day two, 4 tablets day three, 3 tablets day four, 2 tablets day five, then 1 tablet day six Patient not taking: Reported on 10/21/2016 08/30/16   Tammy Triplett, PA-C  varenicline (CHANTIX) 1 MG tablet Take 1 mg by mouth 2 (two) times daily.    Historical Provider, MD     All other systems have been reviewed and were otherwise negative with the exception of those mentioned in the HPI and as above.  Physical Exam: There were no vitals filed for this visit.  General: Alert, no acute distress Cardiovascular: No pedal edema Respiratory: No cyanosis, no use of accessory musculature Skin: No lesions in the area of chief complaint Neurologic: Sensation intact distally Psychiatric: Patient is competent for consent with normal mood and affect Lymphatic: No axillary or cervical lymphadenopathy  MUSCULOSKELETAL: + SLR on the  left  Assessment/Plan: Radiculopathy, left  Plan for Procedure(s): LEFT SIDED LUMBAR 5-SACRUM 1 TRANSFORAMINAL LUMBAR INTERBODY FUSION WITH INSTRUMENTATION AND ALLOGRAFT   Emilee HeroUMONSKI,Keyra Virella LEONARD, MD 11/01/2016 8:27 AM

## 2016-11-02 ENCOUNTER — Inpatient Hospital Stay (HOSPITAL_COMMUNITY): Payer: BLUE CROSS/BLUE SHIELD | Admitting: Certified Registered"

## 2016-11-02 ENCOUNTER — Inpatient Hospital Stay (HOSPITAL_COMMUNITY): Payer: BLUE CROSS/BLUE SHIELD

## 2016-11-02 ENCOUNTER — Encounter (HOSPITAL_COMMUNITY)
Admission: RE | Disposition: A | Discharge status: Home / Self Care | Payer: Self-pay | Source: Ambulatory Visit | Attending: Orthopedic Surgery

## 2016-11-02 ENCOUNTER — Encounter (HOSPITAL_COMMUNITY): Payer: Self-pay | Admitting: *Deleted

## 2016-11-02 ENCOUNTER — Inpatient Hospital Stay (HOSPITAL_COMMUNITY)
Admission: RE | Admit: 2016-11-02 | Discharge: 2016-11-03 | DRG: 460 | Disposition: A | Discharge status: Home / Self Care | Payer: BLUE CROSS/BLUE SHIELD | Source: Ambulatory Visit | Attending: Orthopedic Surgery | Admitting: Orthopedic Surgery

## 2016-11-02 DIAGNOSIS — Z01818 Encounter for other preprocedural examination: Secondary | ICD-10-CM

## 2016-11-02 DIAGNOSIS — F329 Major depressive disorder, single episode, unspecified: Secondary | ICD-10-CM | POA: Diagnosis present

## 2016-11-02 DIAGNOSIS — Z79899 Other long term (current) drug therapy: Secondary | ICD-10-CM

## 2016-11-02 DIAGNOSIS — F419 Anxiety disorder, unspecified: Secondary | ICD-10-CM | POA: Diagnosis not present

## 2016-11-02 DIAGNOSIS — Z882 Allergy status to sulfonamides status: Secondary | ICD-10-CM

## 2016-11-02 DIAGNOSIS — Z7952 Long term (current) use of systemic steroids: Secondary | ICD-10-CM

## 2016-11-02 DIAGNOSIS — M2578 Osteophyte, vertebrae: Secondary | ICD-10-CM | POA: Diagnosis not present

## 2016-11-02 DIAGNOSIS — M4807 Spinal stenosis, lumbosacral region: Secondary | ICD-10-CM | POA: Diagnosis not present

## 2016-11-02 DIAGNOSIS — Z79890 Hormone replacement therapy (postmenopausal): Secondary | ICD-10-CM

## 2016-11-02 DIAGNOSIS — Z885 Allergy status to narcotic agent status: Secondary | ICD-10-CM | POA: Diagnosis not present

## 2016-11-02 DIAGNOSIS — M5137 Other intervertebral disc degeneration, lumbosacral region: Secondary | ICD-10-CM | POA: Diagnosis not present

## 2016-11-02 DIAGNOSIS — Z79891 Long term (current) use of opiate analgesic: Secondary | ICD-10-CM

## 2016-11-02 DIAGNOSIS — E785 Hyperlipidemia, unspecified: Secondary | ICD-10-CM | POA: Diagnosis not present

## 2016-11-02 DIAGNOSIS — M5117 Intervertebral disc disorders with radiculopathy, lumbosacral region: Secondary | ICD-10-CM | POA: Diagnosis not present

## 2016-11-02 DIAGNOSIS — M797 Fibromyalgia: Secondary | ICD-10-CM | POA: Diagnosis present

## 2016-11-02 DIAGNOSIS — R51 Headache: Secondary | ICD-10-CM | POA: Diagnosis not present

## 2016-11-02 DIAGNOSIS — Z888 Allergy status to other drugs, medicaments and biological substances status: Secondary | ICD-10-CM

## 2016-11-02 DIAGNOSIS — Z419 Encounter for procedure for purposes other than remedying health state, unspecified: Secondary | ICD-10-CM

## 2016-11-02 DIAGNOSIS — G709 Myoneural disorder, unspecified: Secondary | ICD-10-CM | POA: Diagnosis not present

## 2016-11-02 DIAGNOSIS — K219 Gastro-esophageal reflux disease without esophagitis: Secondary | ICD-10-CM | POA: Diagnosis not present

## 2016-11-02 DIAGNOSIS — M5416 Radiculopathy, lumbar region: Secondary | ICD-10-CM | POA: Diagnosis not present

## 2016-11-02 DIAGNOSIS — G8929 Other chronic pain: Secondary | ICD-10-CM | POA: Diagnosis present

## 2016-11-02 DIAGNOSIS — I10 Essential (primary) hypertension: Secondary | ICD-10-CM | POA: Diagnosis present

## 2016-11-02 DIAGNOSIS — M5417 Radiculopathy, lumbosacral region: Secondary | ICD-10-CM | POA: Diagnosis not present

## 2016-11-02 DIAGNOSIS — M4326 Fusion of spine, lumbar region: Secondary | ICD-10-CM | POA: Diagnosis not present

## 2016-11-02 DIAGNOSIS — M5116 Intervertebral disc disorders with radiculopathy, lumbar region: Secondary | ICD-10-CM | POA: Diagnosis not present

## 2016-11-02 DIAGNOSIS — M48061 Spinal stenosis, lumbar region without neurogenic claudication: Secondary | ICD-10-CM | POA: Diagnosis present

## 2016-11-02 DIAGNOSIS — M5410 Radiculopathy, site unspecified: Secondary | ICD-10-CM | POA: Diagnosis present

## 2016-11-02 DIAGNOSIS — F418 Other specified anxiety disorders: Secondary | ICD-10-CM | POA: Diagnosis not present

## 2016-11-02 DIAGNOSIS — F17210 Nicotine dependence, cigarettes, uncomplicated: Secondary | ICD-10-CM | POA: Diagnosis not present

## 2016-11-02 DIAGNOSIS — M5418 Radiculopathy, sacral and sacrococcygeal region: Secondary | ICD-10-CM | POA: Diagnosis present

## 2016-11-02 SURGERY — POSTERIOR LUMBAR FUSION 1 LEVEL
Anesthesia: General | Site: Spine Lumbar | Laterality: Left

## 2016-11-02 MED ORDER — SUGAMMADEX SODIUM 200 MG/2ML IV SOLN
INTRAVENOUS | Status: AC
  Filled 2016-11-02: qty 2

## 2016-11-02 MED ORDER — VITAMIN D (ERGOCALCIFEROL) 1.25 MG (50000 UNIT) PO CAPS: 50000.0000 [IU] | ORAL_CAPSULE | ORAL | Status: DC

## 2016-11-02 MED ORDER — BUPIVACAINE LIPOSOME 1.3 % IJ SUSP
INTRAMUSCULAR | Status: DC | PRN
  Administered 2016-11-02: 20 mL

## 2016-11-02 MED ORDER — HYDROMORPHONE HCL 1 MG/ML IJ SOLN
0.2500 mg | INTRAMUSCULAR | Status: DC | PRN
  Administered 2016-11-02 (×3): 0.5 mg via INTRAVENOUS

## 2016-11-02 MED ORDER — LACTATED RINGERS IV SOLN
INTRAVENOUS | Status: DC
  Administered 2016-11-02 (×3): via INTRAVENOUS

## 2016-11-02 MED ORDER — PROPOFOL 10 MG/ML IV BOLUS
INTRAVENOUS | Status: AC
  Filled 2016-11-02: qty 20

## 2016-11-02 MED ORDER — PHENOL 1.4 % MT LIQD: 1.0000 | OROMUCOSAL | Status: DC | PRN

## 2016-11-02 MED ORDER — BUPIVACAINE LIPOSOME 1.3 % IJ SUSP
20.0000 mL | INTRAMUSCULAR | Status: DC
  Filled 2016-11-02: qty 20

## 2016-11-02 MED ORDER — SENNOSIDES-DOCUSATE SODIUM 8.6-50 MG PO TABS: 1.0000 | ORAL_TABLET | Freq: Every evening | ORAL | Status: DC | PRN

## 2016-11-02 MED ORDER — CEFAZOLIN SODIUM 1 G IJ SOLR
INTRAMUSCULAR | Status: AC
  Filled 2016-11-02: qty 20

## 2016-11-02 MED ORDER — 0.9 % SODIUM CHLORIDE (POUR BTL) OPTIME
TOPICAL | Status: DC | PRN
  Administered 2016-11-02 (×3): 1000 mL

## 2016-11-02 MED ORDER — LIDOCAINE HCL (CARDIAC) 20 MG/ML IV SOLN
INTRAVENOUS | Status: DC | PRN
  Administered 2016-11-02: 60 mg via INTRAVENOUS

## 2016-11-02 MED ORDER — TELMISARTAN-HCTZ 80-25 MG PO TABS: 1.0000 | ORAL_TABLET | Freq: Every day | ORAL | Status: DC

## 2016-11-02 MED ORDER — OXYCODONE HCL 5 MG/5ML PO SOLN: 5.0000 mg | Freq: Once | ORAL | Status: DC | PRN

## 2016-11-02 MED ORDER — MIDAZOLAM HCL 2 MG/2ML IJ SOLN
INTRAMUSCULAR | Status: AC
  Filled 2016-11-02: qty 2

## 2016-11-02 MED ORDER — PROPOFOL 500 MG/50ML IV EMUL
INTRAVENOUS | Status: DC | PRN
  Administered 2016-11-02: 50 ug/kg/min via INTRAVENOUS

## 2016-11-02 MED ORDER — SUCCINYLCHOLINE CHLORIDE 200 MG/10ML IV SOSY
PREFILLED_SYRINGE | INTRAVENOUS | Status: AC
Start: 2016-11-02 — End: 2016-11-02
  Filled 2016-11-02: qty 10

## 2016-11-02 MED ORDER — OXYCODONE HCL ER 20 MG PO T12A
20.0000 mg | EXTENDED_RELEASE_TABLET | Freq: Two times a day (BID) | ORAL | Status: DC
  Administered 2016-11-02 – 2016-11-03 (×2): 20 mg via ORAL
  Filled 2016-11-02 (×2): qty 1

## 2016-11-02 MED ORDER — METHYLENE BLUE 0.5 % INJ SOLN
INTRAVENOUS | Status: DC | PRN
  Administered 2016-11-02: .2 mL via INTRADERMAL

## 2016-11-02 MED ORDER — BUPIVACAINE-EPINEPHRINE 0.25% -1:200000 IJ SOLN
INTRAMUSCULAR | Status: DC | PRN
  Administered 2016-11-02: 20 mL
  Administered 2016-11-02: 6 mL

## 2016-11-02 MED ORDER — PANTOPRAZOLE SODIUM 40 MG PO TBEC
40.0000 mg | DELAYED_RELEASE_TABLET | Freq: Every day | ORAL | Status: DC
  Administered 2016-11-02 – 2016-11-03 (×2): 40 mg via ORAL
  Filled 2016-11-02 (×2): qty 1

## 2016-11-02 MED ORDER — FENTANYL CITRATE (PF) 100 MCG/2ML IJ SOLN
INTRAMUSCULAR | Status: AC
  Filled 2016-11-02: qty 2

## 2016-11-02 MED ORDER — HEMOSTATIC AGENTS (NO CHARGE) OPTIME
TOPICAL | Status: DC | PRN
  Administered 2016-11-02: 1 via TOPICAL

## 2016-11-02 MED ORDER — FLEET ENEMA 7-19 GM/118ML RE ENEM: 1.0000 | ENEMA | Freq: Once | RECTAL | Status: DC | PRN

## 2016-11-02 MED ORDER — VARENICLINE TARTRATE 1 MG PO TABS
1.0000 mg | ORAL_TABLET | Freq: Two times a day (BID) | ORAL | Status: DC
  Administered 2016-11-02 – 2016-11-03 (×2): 1 mg via ORAL
  Filled 2016-11-02 (×2): qty 1

## 2016-11-02 MED ORDER — SODIUM CHLORIDE 0.9% FLUSH: 3.0000 mL | Freq: Two times a day (BID) | INTRAVENOUS | Status: DC

## 2016-11-02 MED ORDER — SUCCINYLCHOLINE CHLORIDE 20 MG/ML IJ SOLN
INTRAMUSCULAR | Status: DC | PRN
  Administered 2016-11-02: 60 mg via INTRAVENOUS
  Administered 2016-11-02: 30 mg via INTRAVENOUS

## 2016-11-02 MED ORDER — DEXAMETHASONE SODIUM PHOSPHATE 10 MG/ML IJ SOLN
INTRAMUSCULAR | Status: DC | PRN
Start: 2016-11-02 — End: 2016-11-02
  Administered 2016-11-02: 10 mg via INTRAVENOUS

## 2016-11-02 MED ORDER — ACETAMINOPHEN 325 MG PO TABS: 650.0000 mg | ORAL_TABLET | ORAL | Status: DC | PRN

## 2016-11-02 MED ORDER — DEXTROSE 5 % IV SOLN
INTRAVENOUS | Status: DC | PRN
  Administered 2016-11-02: 20 ug/min via INTRAVENOUS

## 2016-11-02 MED ORDER — CEFAZOLIN SODIUM-DEXTROSE 2-3 GM-% IV SOLR: INTRAVENOUS | Status: DC | PRN

## 2016-11-02 MED ORDER — OXYCODONE HCL 5 MG PO TABS: 5.0000 mg | ORAL_TABLET | Freq: Once | ORAL | Status: DC | PRN

## 2016-11-02 MED ORDER — HYDROMORPHONE HCL 1 MG/ML IJ SOLN
INTRAMUSCULAR | Status: AC
  Filled 2016-11-02: qty 0.5

## 2016-11-02 MED ORDER — DOCUSATE SODIUM 100 MG PO CAPS
100.0000 mg | ORAL_CAPSULE | Freq: Two times a day (BID) | ORAL | Status: DC
  Administered 2016-11-02 – 2016-11-03 (×2): 100 mg via ORAL
  Filled 2016-11-02 (×2): qty 1

## 2016-11-02 MED ORDER — EPHEDRINE SULFATE 50 MG/ML IJ SOLN
INTRAMUSCULAR | Status: DC | PRN
Start: 2016-11-02 — End: 2016-11-02
  Administered 2016-11-02: 10 mg via INTRAVENOUS
  Administered 2016-11-02: 15 mg via INTRAVENOUS
  Administered 2016-11-02: 10 mg via INTRAVENOUS

## 2016-11-02 MED ORDER — MIDAZOLAM HCL 2 MG/2ML IJ SOLN
INTRAMUSCULAR | Status: DC | PRN
  Administered 2016-11-02: 2 mg via INTRAVENOUS

## 2016-11-02 MED ORDER — POVIDONE-IODINE 7.5 % EX SOLN: Freq: Once | CUTANEOUS | Status: DC

## 2016-11-02 MED ORDER — SUGAMMADEX SODIUM 200 MG/2ML IV SOLN
INTRAVENOUS | Status: DC | PRN
  Administered 2016-11-02: 100 mg via INTRAVENOUS

## 2016-11-02 MED ORDER — BUPIVACAINE-EPINEPHRINE (PF) 0.25% -1:200000 IJ SOLN
INTRAMUSCULAR | Status: AC
  Filled 2016-11-02: qty 30

## 2016-11-02 MED ORDER — DEXAMETHASONE SODIUM PHOSPHATE 10 MG/ML IJ SOLN
INTRAMUSCULAR | Status: AC
  Filled 2016-11-02: qty 1

## 2016-11-02 MED ORDER — ALPRAZOLAM 0.5 MG PO TABS: 1.0000 mg | ORAL_TABLET | Freq: Two times a day (BID) | ORAL | Status: DC | PRN

## 2016-11-02 MED ORDER — ROCURONIUM BROMIDE 100 MG/10ML IV SOLN
INTRAVENOUS | Status: DC | PRN
  Administered 2016-11-02: 40 mg via INTRAVENOUS
  Administered 2016-11-02 (×2): 10 mg via INTRAVENOUS
  Administered 2016-11-02: 20 mg via INTRAVENOUS
  Administered 2016-11-02 (×2): 10 mg via INTRAVENOUS

## 2016-11-02 MED ORDER — OXYCODONE-ACETAMINOPHEN 5-325 MG PO TABS
1.0000 | ORAL_TABLET | ORAL | Status: DC | PRN
  Administered 2016-11-02 – 2016-11-03 (×4): 2 via ORAL
  Filled 2016-11-02 (×4): qty 2

## 2016-11-02 MED ORDER — CEFAZOLIN SODIUM-DEXTROSE 2-4 GM/100ML-% IV SOLN
2.0000 g | INTRAVENOUS | Status: AC
  Administered 2016-11-02 (×2): 2 g via INTRAVENOUS
  Filled 2016-11-02: qty 100

## 2016-11-02 MED ORDER — CARISOPRODOL 350 MG PO TABS
350.0000 mg | ORAL_TABLET | Freq: Four times a day (QID) | ORAL | Status: DC | PRN
  Administered 2016-11-02 – 2016-11-03 (×2): 350 mg via ORAL
  Filled 2016-11-02 (×2): qty 1

## 2016-11-02 MED ORDER — SODIUM CHLORIDE 0.9 % IV SOLN: INTRAVENOUS | Status: DC

## 2016-11-02 MED ORDER — BISACODYL 5 MG PO TBEC: 5.0000 mg | DELAYED_RELEASE_TABLET | Freq: Every day | ORAL | Status: DC | PRN

## 2016-11-02 MED ORDER — ONDANSETRON HCL 4 MG/2ML IJ SOLN: 4.0000 mg | INTRAMUSCULAR | Status: DC | PRN

## 2016-11-02 MED ORDER — ZOLPIDEM TARTRATE 5 MG PO TABS: 5.0000 mg | ORAL_TABLET | Freq: Every evening | ORAL | Status: DC | PRN

## 2016-11-02 MED ORDER — MENTHOL 3 MG MT LOZG: 1.0000 | LOZENGE | OROMUCOSAL | Status: DC | PRN

## 2016-11-02 MED ORDER — AMLODIPINE BESYLATE 5 MG PO TABS
5.0000 mg | ORAL_TABLET | Freq: Every day | ORAL | Status: DC
  Filled 2016-11-02: qty 1

## 2016-11-02 MED ORDER — METHYLENE BLUE 0.5 % INJ SOLN
INTRAVENOUS | Status: AC
  Filled 2016-11-02: qty 10

## 2016-11-02 MED ORDER — LIDOCAINE 2% (20 MG/ML) 5 ML SYRINGE
INTRAMUSCULAR | Status: AC
  Filled 2016-11-02: qty 5

## 2016-11-02 MED ORDER — CEFAZOLIN IN D5W 1 GM/50ML IV SOLN
1.0000 g | Freq: Three times a day (TID) | INTRAVENOUS | Status: AC
  Administered 2016-11-02 – 2016-11-03 (×2): 1 g via INTRAVENOUS
  Filled 2016-11-02 (×2): qty 50

## 2016-11-02 MED ORDER — ACETAMINOPHEN 650 MG RE SUPP: 650.0000 mg | RECTAL | Status: DC | PRN

## 2016-11-02 MED ORDER — ONDANSETRON HCL 4 MG/2ML IJ SOLN
INTRAMUSCULAR | Status: DC | PRN
  Administered 2016-11-02: 4 mg via INTRAVENOUS

## 2016-11-02 MED ORDER — ALUM & MAG HYDROXIDE-SIMETH 200-200-20 MG/5ML PO SUSP: 30.0000 mL | Freq: Four times a day (QID) | ORAL | Status: DC | PRN

## 2016-11-02 MED ORDER — ONDANSETRON HCL 4 MG/2ML IJ SOLN
INTRAMUSCULAR | Status: AC
Start: 2016-11-02 — End: 2016-11-02
  Filled 2016-11-02: qty 2

## 2016-11-02 MED ORDER — SERTRALINE HCL 50 MG PO TABS
100.0000 mg | ORAL_TABLET | Freq: Every day | ORAL | Status: DC
  Administered 2016-11-03: 100 mg via ORAL
  Filled 2016-11-02: qty 2

## 2016-11-02 MED ORDER — ATORVASTATIN CALCIUM 20 MG PO TABS
20.0000 mg | ORAL_TABLET | Freq: Every day | ORAL | Status: DC
  Administered 2016-11-03: 20 mg via ORAL
  Filled 2016-11-02: qty 1

## 2016-11-02 MED ORDER — ROCURONIUM BROMIDE 10 MG/ML (PF) SYRINGE
PREFILLED_SYRINGE | INTRAVENOUS | Status: AC
  Filled 2016-11-02: qty 10

## 2016-11-02 MED ORDER — METHOCARBAMOL 500 MG PO TABS: 500.0000 mg | ORAL_TABLET | Freq: Four times a day (QID) | ORAL | Status: DC | PRN

## 2016-11-02 MED ORDER — THROMBIN 20000 UNITS EX SOLR
CUTANEOUS | Status: AC
  Filled 2016-11-02: qty 20000

## 2016-11-02 MED ORDER — PROPOFOL 10 MG/ML IV BOLUS
INTRAVENOUS | Status: DC | PRN
  Administered 2016-11-02: 140 mg via INTRAVENOUS
  Administered 2016-11-02: 60 mg via INTRAVENOUS

## 2016-11-02 MED ORDER — THROMBIN 20000 UNITS EX KIT
PACK | CUTANEOUS | Status: DC | PRN
  Administered 2016-11-02: 20000 [IU] via TOPICAL

## 2016-11-02 MED ORDER — SODIUM CHLORIDE 0.9% FLUSH: 3.0000 mL | INTRAVENOUS | Status: DC | PRN

## 2016-11-02 MED ORDER — FENTANYL CITRATE (PF) 100 MCG/2ML IJ SOLN
INTRAMUSCULAR | Status: DC | PRN
  Administered 2016-11-02: 50 ug via INTRAVENOUS
  Administered 2016-11-02: 100 ug via INTRAVENOUS
  Administered 2016-11-02 (×4): 50 ug via INTRAVENOUS
  Administered 2016-11-02: 100 ug via INTRAVENOUS
  Administered 2016-11-02: 50 ug via INTRAVENOUS

## 2016-11-02 MED FILL — Heparin Sodium (Porcine) Inj 1000 Unit/ML: INTRAMUSCULAR | Qty: 30 | Status: AC

## 2016-11-02 MED FILL — Sodium Chloride IV Soln 0.9%: INTRAVENOUS | Qty: 1000 | Status: AC

## 2016-11-02 SURGICAL SUPPLY — 91 items
BENZOIN TINCTURE PRP APPL 2/3 (GAUZE/BANDAGES/DRESSINGS) ×2 IMPLANT
BIT DRILL 3.2 (BIT) ×1
BIT DRILL 65X3.2XQC STP NS (BIT) ×1 IMPLANT
BIT DRL 65X3.2XQC STP NS (BIT) ×1
BLADE SURG ROTATE 9660 (MISCELLANEOUS) IMPLANT
BUR PRESCISION 1.7 ELITE (BURR) ×2 IMPLANT
BUR ROUND PRECISION 4.0 (BURR) IMPLANT
BUR SABER RD CUTTING 3.0 (BURR) IMPLANT
CAGE CONCORDE BULLET 9X7X27 (Cage) ×2 IMPLANT
CARTRIDGE OIL MAESTRO DRILL (MISCELLANEOUS) ×1 IMPLANT
CLSR STERI-STRIP ANTIMIC 1/2X4 (GAUZE/BANDAGES/DRESSINGS) ×2 IMPLANT
CONT SPEC STER OR (MISCELLANEOUS) ×2 IMPLANT
COVER MAYO STAND STRL (DRAPES) ×4 IMPLANT
COVER SURGICAL LIGHT HANDLE (MISCELLANEOUS) ×2 IMPLANT
DIFFUSER DRILL AIR PNEUMATIC (MISCELLANEOUS) ×2 IMPLANT
DRAIN CHANNEL 15F RND FF W/TCR (WOUND CARE) ×2 IMPLANT
DRAPE C-ARM 42X72 X-RAY (DRAPES) ×2 IMPLANT
DRAPE C-ARMOR (DRAPES) IMPLANT
DRAPE POUCH INSTRU U-SHP 10X18 (DRAPES) ×2 IMPLANT
DRAPE SURG 17X23 STRL (DRAPES) ×8 IMPLANT
DURAPREP 26ML APPLICATOR (WOUND CARE) ×2 IMPLANT
ELECT BLADE 4.0 EZ CLEAN MEGAD (MISCELLANEOUS) ×2
ELECT CAUTERY BLADE 6.4 (BLADE) ×2 IMPLANT
ELECT REM PT RETURN 9FT ADLT (ELECTROSURGICAL) ×2
ELECTRODE BLDE 4.0 EZ CLN MEGD (MISCELLANEOUS) ×1 IMPLANT
ELECTRODE REM PT RTRN 9FT ADLT (ELECTROSURGICAL) ×1 IMPLANT
EVACUATOR SILICONE 100CC (DRAIN) ×2 IMPLANT
FEE INTRAOP MONITOR IMPULS NCS (MISCELLANEOUS) ×1 IMPLANT
GAUZE SPONGE 4X4 12PLY STRL (GAUZE/BANDAGES/DRESSINGS) ×2 IMPLANT
GAUZE SPONGE 4X4 16PLY XRAY LF (GAUZE/BANDAGES/DRESSINGS) ×2 IMPLANT
GLOVE BIO SURGEON STRL SZ7 (GLOVE) ×2 IMPLANT
GLOVE BIO SURGEON STRL SZ8 (GLOVE) ×2 IMPLANT
GLOVE BIOGEL M 8.0 STRL (GLOVE) ×2 IMPLANT
GLOVE BIOGEL PI IND STRL 7.0 (GLOVE) ×1 IMPLANT
GLOVE BIOGEL PI IND STRL 8 (GLOVE) ×1 IMPLANT
GLOVE BIOGEL PI IND STRL 8.5 (GLOVE) ×1 IMPLANT
GLOVE BIOGEL PI INDICATOR 7.0 (GLOVE) ×1
GLOVE BIOGEL PI INDICATOR 8 (GLOVE) ×1
GLOVE BIOGEL PI INDICATOR 8.5 (GLOVE) ×1
GOWN STRL REUS W/ TWL LRG LVL3 (GOWN DISPOSABLE) ×2 IMPLANT
GOWN STRL REUS W/ TWL XL LVL3 (GOWN DISPOSABLE) ×1 IMPLANT
GOWN STRL REUS W/TWL LRG LVL3 (GOWN DISPOSABLE) ×2
GOWN STRL REUS W/TWL XL LVL3 (GOWN DISPOSABLE) ×1
INTRAOP MONITOR FEE IMPULS NCS (MISCELLANEOUS) ×1
INTRAOP MONITOR FEE IMPULSE (MISCELLANEOUS) ×1
IV CATH 14GX2 1/4 (CATHETERS) ×2 IMPLANT
KIT BASIN OR (CUSTOM PROCEDURE TRAY) ×2 IMPLANT
KIT POSITION SURG JACKSON T1 (MISCELLANEOUS) ×2 IMPLANT
KIT ROOM TURNOVER OR (KITS) ×2 IMPLANT
MARKER SKIN DUAL TIP RULER LAB (MISCELLANEOUS) ×2 IMPLANT
MIX DBX 10CC 35% BONE (Bone Implant) ×2 IMPLANT
NDL SAFETY ECLIPSE 18X1.5 (NEEDLE) ×1 IMPLANT
NEEDLE 22X1 1/2 (OR ONLY) (NEEDLE) ×2 IMPLANT
NEEDLE HYPO 18GX1.5 SHARP (NEEDLE) ×1
NEEDLE HYPO 25GX1X1/2 BEV (NEEDLE) ×2 IMPLANT
NEEDLE SPNL 18GX3.5 QUINCKE PK (NEEDLE) ×4 IMPLANT
NS IRRIG 1000ML POUR BTL (IV SOLUTION) ×2 IMPLANT
OIL CARTRIDGE MAESTRO DRILL (MISCELLANEOUS) ×2
PACK LAMINECTOMY ORTHO (CUSTOM PROCEDURE TRAY) ×2 IMPLANT
PACK UNIVERSAL I (CUSTOM PROCEDURE TRAY) ×2 IMPLANT
PAD ARMBOARD 7.5X6 YLW CONV (MISCELLANEOUS) ×4 IMPLANT
PATTIES SURGICAL .5 X1 (DISPOSABLE) ×2 IMPLANT
PATTIES SURGICAL .5X1.5 (GAUZE/BANDAGES/DRESSINGS) ×2 IMPLANT
PROBE PEDCLE PROBE MAGSTM DISP (MISCELLANEOUS) ×2 IMPLANT
ROD PRE BENT EXPEDIUM 35MM (Rod) ×4 IMPLANT
SCREW CORTICAL VIPER 7X35 (Screw) ×2 IMPLANT
SCREW CORTICAL VIPER 7X40MM (Screw) ×2 IMPLANT
SCREW SET SINGLE INNER (Screw) ×8 IMPLANT
SCREW VIPER CORT FIX 6X35 (Screw) ×4 IMPLANT
SPONGE GAUZE 4X4 12PLY STER LF (GAUZE/BANDAGES/DRESSINGS) ×2 IMPLANT
SPONGE INTESTINAL PEANUT (DISPOSABLE) ×2 IMPLANT
SPONGE SURGIFOAM ABS GEL 100 (HEMOSTASIS) ×2 IMPLANT
STRIP CLOSURE SKIN 1/2X4 (GAUZE/BANDAGES/DRESSINGS) ×4 IMPLANT
SURGIFLO W/THROMBIN 8M KIT (HEMOSTASIS) IMPLANT
SUT ETHILON 2 0 FS 18 (SUTURE) ×2 IMPLANT
SUT MNCRL AB 4-0 PS2 18 (SUTURE) ×2 IMPLANT
SUT VIC AB 0 CT1 18XCR BRD 8 (SUTURE) ×1 IMPLANT
SUT VIC AB 0 CT1 8-18 (SUTURE) ×1
SUT VIC AB 1 CT1 18XCR BRD 8 (SUTURE) ×1 IMPLANT
SUT VIC AB 1 CT1 8-18 (SUTURE) ×1
SUT VIC AB 2-0 CT2 18 VCP726D (SUTURE) ×2 IMPLANT
SYR 20CC LL (SYRINGE) ×2 IMPLANT
SYR BULB IRRIGATION 50ML (SYRINGE) ×2 IMPLANT
SYR CONTROL 10ML LL (SYRINGE) ×4 IMPLANT
SYR TB 1ML LUER SLIP (SYRINGE) ×2 IMPLANT
TAPE CLOTH SURG 6X10 WHT LF (GAUZE/BANDAGES/DRESSINGS) ×2 IMPLANT
TOWEL OR 17X24 6PK STRL BLUE (TOWEL DISPOSABLE) ×2 IMPLANT
TOWEL OR 17X26 10 PK STRL BLUE (TOWEL DISPOSABLE) ×2 IMPLANT
TRAY FOLEY CATH 16FRSI W/METER (SET/KITS/TRAYS/PACK) ×2 IMPLANT
WATER STERILE IRR 1000ML POUR (IV SOLUTION) ×2 IMPLANT
YANKAUER SUCT BULB TIP NO VENT (SUCTIONS) ×2 IMPLANT

## 2016-11-02 NOTE — Anesthesia Postprocedure Evaluation (Signed)
Anesthesia Post Note  Patient: Melanie Avila  Procedure(s) Performed: Procedure(s) (LRB): LEFT SIDED LUMBAR 5-SACRUM 1 TRANSFORAMINAL LUMBAR INTERBODY FUSION WITH INSTRUMENTATION AND ALLOGRAFT (Left)  Patient location during evaluation: PACU Anesthesia Type: General Level of consciousness: awake and alert and oriented Pain management: pain level controlled Vital Signs Assessment: post-procedure vital signs reviewed and stable Respiratory status: spontaneous breathing, nonlabored ventilation and respiratory function stable Cardiovascular status: blood pressure returned to baseline and stable Postop Assessment: no signs of nausea or vomiting Anesthetic complications: no    Last Vitals:  Vitals:   11/02/16 1815 11/02/16 1827  BP:  (!) 155/93  Pulse: 92 93  Resp: 13 (!) 9  Temp:      Last Pain:  Vitals:   11/02/16 1813  TempSrc:   PainSc: 10-Worst pain ever                 Quanta Roher A.

## 2016-11-02 NOTE — Transfer of Care (Signed)
Immediate Anesthesia Transfer of Care Note  Patient: Melanie Avila  Procedure(s) Performed: Procedure(s) with comments: LEFT SIDED LUMBAR 5-SACRUM 1 TRANSFORAMINAL LUMBAR INTERBODY FUSION WITH INSTRUMENTATION AND ALLOGRAFT (Left) - LEFT SIDED LUMBAR 5-SACRUM 1 TRANSFORAMINAL LUMBAR INTERBODY FUSION WITH INSTRUMENTATION AND ALLOGRAFT  Patient Location: PACU  Anesthesia Type:General  Level of Consciousness: lethargic and responds to stimulation  Airway & Oxygen Therapy: Patient Spontanous Breathing and Patient connected to nasal cannula oxygen  Post-op Assessment: Report given to RN and Patient moving all extremities X 4  Post vital signs: Reviewed and stable  Last Vitals:  Vitals:   11/02/16 0930  BP: (!) 147/80  Pulse: 69  Resp: 20  Temp: 36.6 C    Last Pain:  Vitals:   11/02/16 0930  TempSrc: Oral         Complications: No apparent anesthesia complications

## 2016-11-02 NOTE — Anesthesia Preprocedure Evaluation (Addendum)
Anesthesia Evaluation  Patient identified by MRN, date of birth, ID band Patient awake    Reviewed: Allergy & Precautions, NPO status , Patient's Chart, lab work & pertinent test results  History of Anesthesia Complications (+) PONV and history of anesthetic complications  Airway Mallampati: IV  TM Distance: <3 FB Neck ROM: Full    Dental  (+) Teeth Intact, Chipped, Dental Advisory Given,    Pulmonary neg shortness of breath, neg sleep apnea, neg COPD, neg recent URI, Current Smoker,    breath sounds clear to auscultation       Cardiovascular hypertension,  Rhythm:Regular     Neuro/Psych  Headaches, PSYCHIATRIC DISORDERS Anxiety Depression  Neuromuscular disease    GI/Hepatic Neg liver ROS, GERD  Medicated and Controlled,  Endo/Other    Renal/GU negative Renal ROS     Musculoskeletal  (+) Arthritis , Osteoarthritis,  Fibromyalgia -  Abdominal   Peds  Hematology  (+) anemia ,   Anesthesia Other Findings   Reproductive/Obstetrics                            Anesthesia Physical Anesthesia Plan  ASA: III  Anesthesia Plan: General   Post-op Pain Management:    Induction: Intravenous  Airway Management Planned: Oral ETT  Additional Equipment: None  Intra-op Plan:   Post-operative Plan: Extubation in OR  Informed Consent: I have reviewed the patients History and Physical, chart, labs and discussed the procedure including the risks, benefits and alternatives for the proposed anesthesia with the patient or authorized representative who has indicated his/her understanding and acceptance.   Dental advisory given  Plan Discussed with: CRNA and Surgeon  Anesthesia Plan Comments:         Anesthesia Quick Evaluation

## 2016-11-02 NOTE — Anesthesia Procedure Notes (Signed)
Procedure Name: Intubation Date/Time: 11/02/2016 12:24 PM Performed by: Jefm MilesENNIE, Mickey Hebel E Pre-anesthesia Checklist: Patient identified, Emergency Drugs available, Suction available, Patient being monitored and Timeout performed Patient Re-evaluated:Patient Re-evaluated prior to inductionOxygen Delivery Method: Circle system utilized Preoxygenation: Pre-oxygenation with 100% oxygen Intubation Type: IV induction and Rapid sequence Ventilation: Mask ventilation without difficulty Laryngoscope Size: Miller and 2 Grade View: Grade I Tube type: Oral Tube size: 7.0 mm Number of attempts: 2 Airway Equipment and Method: Stylet and Bougie stylet Placement Confirmation: ETT inserted through vocal cords under direct vision,  positive ETCO2 and breath sounds checked- equal and bilateral Secured at: 22 cm Tube secured with: Tape Dental Injury: Teeth and Oropharynx as per pre-operative assessment  Difficulty Due To: Difficulty was unanticipated and Difficult Airway- due to anterior larynx Future Recommendations: Recommend- induction with short-acting agent, and alternative techniques readily available Comments: First attempt with Mac 3 and Grade IV view, Second attempt successful with Mil 2, Grade I view but unable to insert ETT, maintained view and used Bougie for inserting ETT.

## 2016-11-03 MED FILL — Thrombin For Soln 20000 Unit: CUTANEOUS | Qty: 1 | Status: AC

## 2016-11-03 NOTE — Evaluation (Signed)
Occupational Therapy Evaluation and Discharge Patient Details Name: Melanie Avila MRN: [id number] DOB: 08/05/1961 Today's Date: 11/03/2016    History of Present Illness Pt is a 55 y.o. female s/p L5-S1 TLIF. PMHx: Anxiety, Depression, Fibromyalgia, GERD, Heart murmur, Hyperlipidemia, HTN, Back sx x2.   Clinical Impression   Pt reports she was independent with ADL PTA. Currently pt overall supervision for ADL and functional mobility, approaching mod I. All back, safety, and ADL education completed with pt; she was able to recall 3/3 back precautions at end of session. Pt planning to d/c home with 24/7 supervision from family. No further acute OT needs identified; signing off at this time. Please re-consult if needs change. Thank you for this referral.    Follow Up Recommendations  No OT follow up;Supervision - Intermittent    Equipment Recommendations  None recommended by OT    Recommendations for Other Services       Precautions / Restrictions Precautions Precautions: Back;Fall Precaution Booklet Issued: Yes (comment) Precaution Comments: Educated pt on back precautions. Pt able to verbally recall 3/3. Required Braces or Orthoses: Spinal Brace Restrictions Weight Bearing Restrictions: No      Mobility Bed Mobility               General bed mobility comments: Pt OOB upon arrival.  Transfers Overall transfer level: Needs assistance Equipment used: None Transfers: Sit to/from Stand Sit to Stand: Supervision         General transfer comment: Approaching mod I.    Balance Overall balance assessment: No apparent balance deficits (not formally assessed)                                          ADL Overall ADL's : Needs assistance/impaired Eating/Feeding: Independent;Sitting   Grooming: Supervision/safety;Standing Grooming Details (indicate cue type and reason): Educated on use of 2 cups for oral care Upper Body Bathing: Supervision/  safety;Standing   Lower Body Bathing: Supervison/ safety   Upper Body Dressing : Supervision/safety   Lower Body Dressing: Supervision/safety Lower Body Dressing Details (indicate cue type and reason): Educated on compensatory strategies for LB ADL. Toilet Transfer: Supervision/safety;Ambulation;BSC Toilet Transfer Details (indicate cue type and reason): Educated on use of 3 in 1 over toilet if needed upon return home. Toileting- Clothing Manipulation and Hygiene: Supervision/safety Toileting - Clothing Manipulation Details (indicate cue type and reason): Educated on proper technique for peri care without twisting. Tub/ Shower Transfer: Supervision/safety;Walk-in shower;Ambulation Tub/Shower Transfer Details (indicate cue type and reason): Educated on use of 3 in 1 in shower as a seat if needed. Functional mobility during ADLs: Supervision/safety General ADL Comments: Educated pt on maintaining back precautions during functional activities, frequent movement throughout the day upon return home, keeping frequently used items at counter top height.     Vision Vision Assessment?: No apparent visual deficits   Perception     Praxis      Pertinent Vitals/Pain Pain Assessment: No/denies pain     Hand Dominance     Extremity/Trunk Assessment Upper Extremity Assessment Upper Extremity Assessment: Overall WFL for tasks assessed   Lower Extremity Assessment Lower Extremity Assessment: Defer to PT evaluation   Cervical / Trunk Assessment Cervical / Trunk Assessment: Other exceptions Cervical / Trunk Exceptions: s/p spinal sx   Communication Communication Communication: No difficulties   Cognition Arousal/Alertness: Awake/alert Behavior During Therapy: WFL for tasks assessed/performed Overall Cognitive Status: Within  Functional Limits for tasks assessed                     General Comments       Exercises       Shoulder Instructions      Home Living  Family/patient expects to be discharged to:: Private residence Living Arrangements: Spouse/significant other Available Help at Discharge: Family;Available 24 hours/day Type of Home: House Home Access: Stairs to enter [employer]ance Stairs-Number of Steps: 5   Home Layout: One level     Bathroom Shower/Tub: Producer, television/film/videoWalk-in shower   Bathroom Toilet: Handicapped height     Home Equipment: Cane - single point;Bedside commode          Prior Functioning/Environment Level of Independence: Independent                 OT Problem List:     OT Treatment/Interventions:      OT Goals(Current goals can be found in the care plan section) Acute Rehab OT Goals Patient Stated Goal: return home OT Goal Formulation: All assessment and education complete, DC therapy  OT Frequency:     Barriers to D/C:            Co-evaluation              End of Session Nurse Communication: Mobility status;Other (comment) (no equipment or f/u needs)  Activity Tolerance: Patient tolerated treatment well Patient left: Other (comment) (standing in room)   Time: [id number] OT Time Calculation (min): 12 min Charges:  OT General Charges $OT Visit: 1 Procedure OT Evaluation $OT Eval Moderate Complexity: 1 Procedure G-Codes:     Gaye AlkenBailey A Rollie Hynek M.S., OTR/L Pager: [phone]  11/03/2016, 7:54 AM

## 2016-11-03 NOTE — Progress Notes (Signed)
    Patient doing well Has been ambulating Left leg pain substantially improved Minimal LBP   Physical Exam: Vitals:   11/02/16 2355 11/03/16 0400  BP: (!) 107/58 106/62  Pulse: (!) 102 81  Resp: 18 20  Temp: 98.6 F (37 C) 98.6 F (37 C)   Patient looks excellent Patient is standing comfortably Dressing in place NVI  Drain output: 260/10 hours  POD #1 s/p right L5/S1 revision decompression and fusion, doing very well  - up with PT/OT, encourage ambulation - Percocet for pain, Valium for muscle spasms - d/c home today after PT and after brace arrives from [employer] - patient will f/u Saturday morning for likely d/c of drain

## 2016-11-03 NOTE — Progress Notes (Signed)
Pt doing well. Pt and husband given D/C instructions with verbal understanding. Pt instructed on how to empty and recharge JP drain. Pt was instructed to record the amount of drainage output. Pt's IV was removed prior to D/C. Pt's incision is clean and dry with no sign of infection. Pt D/C'd home with JP drain @ 1100 per MD order. Pt is stable @ D/C and has no other needs at this time. Rema FendtAshley Tara Rud, RN

## 2016-11-03 NOTE — Evaluation (Signed)
Physical Therapy Evaluation Patient Details Name: Melanie Avila MRN: [id number] DOB: 02/28/1961 Today's Date: 11/03/2016   History of Present Illness  Pt is a 55 y.o. female s/p L5-S1 TLIF. PMHx: Anxiety, Depression, Fibromyalgia, GERD, Heart murmur, Hyperlipidemia, HTN, Back sx x2.  Clinical Impression  Pt presented OOB, standing up in room when PT entered. Prior to admission pt reported that she was independent with all functional mobility. Pt moving very well during evaluation with supervision for safety with ambulation and during stair training. PT reviewed 3/3 back precautions and log roll technique for bed mobility. No further acute PT needs identified. PT signing-off at this time.     Follow Up Recommendations No PT follow up;Supervision - Intermittent    Equipment Recommendations  None recommended by PT    Recommendations for Other Services       Precautions / Restrictions Precautions Precautions: Back;Fall Precaution Booklet Issued: No (OT provided handout) Precaution Comments: Reviewed 3/3 back precaution with pt. Required Braces or Orthoses: Spinal Brace Restrictions Weight Bearing Restrictions: No      Mobility  Bed Mobility               General bed mobility comments: Pt OOB upon arrival. PT verbally reviewed log roll technique with pt.  Transfers Overall transfer level: Needs assistance Equipment used: None Transfers: Sit to/from Stand Sit to Stand: Modified independent (Device/Increase time)         General transfer comment: increased time  Ambulation/Gait Ambulation/Gait assistance: Supervision Ambulation Distance (Feet): 200 Feet Assistive device: None Gait Pattern/deviations: Step-through pattern Gait velocity: decreased Gait velocity interpretation: Below normal speed for age/gender General Gait Details: no instability noted during ambulation, supervision for safety  Stairs Stairs: Yes Stairs assistance: Supervision Stair Management:  Two rails;Alternating pattern;Forwards Number of Stairs: 15 General stair comments: no instability noted with use of alternating pattern with ascending and descending, supervision for safety'  Wheelchair Mobility    Modified Rankin (Stroke Patients Only)       Balance Overall balance assessment: No apparent balance deficits (not formally assessed)                                           Pertinent Vitals/Pain Pain Assessment: No/denies pain    Home Living Family/patient expects to be discharged to:: Private residence Living Arrangements: Spouse/significant other Available Help at Discharge: Family;Available 24 hours/day Type of Home: House Home Access: Stairs to enter   [employer]trance Stairs-Number of Steps: 5 Home Layout: One level Home Equipment: Cane - single point;Bedside commode      Prior Function Level of Independence: Independent               Hand Dominance        Extremity/Trunk Assessment   Upper Extremity Assessment: Defer to OT evaluation           Lower Extremity Assessment: Overall WFL for tasks assessed      Cervical / Trunk Assessment: Other exceptions  Communication   Communication: No difficulties  Cognition Arousal/Alertness: Awake/alert Behavior During Therapy: WFL for tasks assessed/performed Overall Cognitive Status: Within Functional Limits for tasks assessed                      General Comments      Exercises     Assessment/Plan    PT Assessment Patent does not need any further PT services  PT Problem List            PT Treatment Interventions      PT Goals (Current goals can be found in the Care Plan section)  Acute Rehab PT Goals Patient Stated Goal: return home PT Goal Formulation: With patient    Frequency     Barriers to discharge        Co-evaluation               End of Session   Activity Tolerance: Patient tolerated treatment well Patient left: in chair;with  call bell/phone within reach Nurse Communication: Mobility status         Time: [id number] PT Time Calculation (min) (ACUTE ONLY): 10 min   Charges:   PT Evaluation $PT Eval Low Complexity: 1 Procedure     PT G CodesAlessandra Bevels:        Melanie Avila 11/03/2016, 8:44 AM Deborah ChalkJennifer Amarianna Abplanalp, PT, DPT [phone]

## 2016-11-04 NOTE — Addendum Note (Signed)
Addendum  created 11/04/16 1515 by Val Eaglehristopher Demarr Kluever, MD   Anesthesia Staff edited

## 2016-11-04 NOTE — Op Note (Addendum)
NAMHulen Skains:  Brunty, Codie                 ACCOUNT NO.:  [id number]  MEDICAL RECORD NO.:  [id number]  LOCATION:                                 FACILITY:  PHYSICIAN:  Estill BambergMark Reiana Poteet, MD      DATE OF BIRTH:  [birth year]  DATE OF PROCEDURE:  11/02/2016                              OPERATIVE REPORT   PREOPERATIVE DIAGNOSES: 1. Left-sided L5, S1 radiculopathy. 2. Status post previous L5-S1 decompression x2 by another provider. 3. Severe L5-S1 degenerative disk disease with lateral recess and     neural foraminal stenosis at L5-S1.  POSTOPERATIVE DIAGNOSES: 1. Left-sided L5, S1 radiculopathy. 2. Status post previous L5-S1 decompression x2 by another provider. 3. Severe L5-S1 degenerative disk disease with lateral recess and     neural foraminal stenosis at L5-S1.  PROCEDURE: 1. Revision decompression, L5-S1, with complex removal of disk     osteophyte complex on the left side at L5-S1, compressing the left     S1 and L5 nerves. 2. Complex left-sided L5-S1 transforaminal lumbar interbody fusion     with abundant scar tissue identified in the region of the lateral     recess of L5-S1, requiring meticulous dissection. 3. Right-sided L5-S1 posterolateral fusion. 4. Posterior nonsegmental instrumentation, L5, S1. 5. Insertion of interbody device x1 (7 x 27 mm Concorde bullet cage). 6. Use of local autograft. 7. Use of morselized allograft. 8. Intraoperative use of fluoroscopy.  SURGEON:  Estill BambergMark Orbie Grupe, MD  ASSISTANT:  Jason CoopKayla McKenzie, PA-C  ANESTHESIA:  General endotracheal anesthesia.  COMPLICATIONS:  None.  DISPOSITION:  Stable.  ESTIMATED BLOOD LOSS:  200 mL.  INDICATIONS FOR SURGERY:  Briefly, Ms. Katherina RightDenny is a pleasant 55 year old female who did present to me with ongoing chronic pain in the left leg. The pain did increase over the course of the last couple months.  The patient did have multiple injections and additional nonoperative measures.  Given her ongoing pain, she did wish  to proceed with the procedure reflected above.  The patient was fully aware of the risks and limitations of surgery, including the possibility of lack of resolution of leg pain.  OPERATIVE DETAILS:  On November 02, 2016, patient was brought to surgery and general endotracheal anesthesia was administered.  The patient was placed prone on a well-padded flat Jackson bed with a spinal frame. Antibiotics were given.  The back was prepped and draped in the usual sterile fashion and a time-out procedure was performed.  The patient's previous incision was re-utilized.  The lamina of L5 and S1 was identified and subperiosteally exposed.  Using anatomic landmarks in addition to AP and lateral fluoroscopy, I did cannulate the L5 and S1 pedicles on the right and left sides.  On the right side, the facet joint was decorticated using a high-speed bur, as was the posterolateral gutter.  I then placed screws in the appropriate diameter and length into the L5 and S1 pedicles on the right.  A 35 mm rod was placed and distraction was applied across the rod.  On the left side, the pedicles were cannulated and bone wax was placed in that place.  I then proceeded with a full facetectomy on the  left side.  I then proceeded with the decompression.  This portion of the procedure was extremely meticulous, as the patient was noted to have 2 previous decompressions.  As such, there was abundant scar tissue noted in the region of the left S1 nerve and lateral recess, and region overlying the dura.  I was however able to meticulously decompress the nerve by removing the disk osteophyte complex and facet hypertrophy on the left side. This portion of the procedure normally takes about 20 minutues, but given the complex nature of the scar tissue and nerve adhesions, it did instead take about 90 minutes. With an assistant holding medial retraction of the left S1 nerve, I did proceed with the transforaminal lumbar fusion  portion of the procedure.  Once again, this was a complex portion of the procedure, given the amount of scar tissue noted from the patient's previous surgery.  I was however, able to perform an L5-S1 intervertebral diskectomy.  Mobilization of the left S1 nerve was minimal, given the abundant scar tissue identified.  Once the diskectomy was performed, the intervertebral space was packed liberally with autograft and allograft.  The appropriate size interbody spacer was also packed with allograft and autograft and tamped into position in the usual fashion.  I was very pleased with the press-fit of the implant.  I then removed the distraction on the contralateral right side.  On the left side, I did place L5 and S1 pedicle screws as well as a 35 mm rod. Caps were placed and a final locking procedure was performed.  On the right side, I did pack autograft as well as allograft into the posterolateral gutter to help aid in the success of the posterolateral fusion.  Of note, I did use triggered EMG to test the screws on the left side, and there was no screw that tested below 20 milliamps.  All bleeding was then controlled.  A #15 deep Blake drain was placed.  I was very pleased with the final AP and lateral fluoroscopic images.  The wound was then closed in layers using #1 Vicryl followed by 2-0 Vicryl followed by 4-0 Monocryl.  Benzoin and Steri-Strips were applied followed by sterile dressing.  All instrument counts were correct at the termination of the procedure.  Of note, Jason CoopKayla McKenzie was my assistant throughout surgery, and did aid in retraction, suctioning, and closure from start to finish.     Estill BambergMark Tore Carreker, MD   ______________________________ Estill BambergMark Arthurine Oleary, MD    MD/MEDQ  D:  11/02/2016  T:  11/03/2016  Job:  [id number]

## 2016-11-05 DIAGNOSIS — Z9889 Other specified postprocedural states: Secondary | ICD-10-CM | POA: Diagnosis not present

## 2016-11-09 NOTE — Discharge Summary (Signed)
Patient ID: Melanie Avila MRN: [id number] DOB/AGE: 55/07/1961 55 y.o.  Admit date: 11/02/2016 Discharge date: 11/03/2016  Admission Diagnoses:  Active Problems:   Radiculopathy   Discharge Diagnoses:  Same  Past Medical History:  Diagnosis Date  . Anemia   . Anxiety   . Complication of anesthesia    last shoulder surg 2010-dsc-2 hr after block to surg-neck swelled-hard to tube-stayed RCC  . DDD (degenerative disc disease)   . Depression   . Fibromyalgia   . GERD (gastroesophageal reflux disease)   . Headache    migraines in her younger years  . Heart murmur    since birth and never has been a problem  . History of kidney stones    7 times  . Hyperlipidemia   . Hypertension   . Neuromuscular disorder (HCC)    nerve damage left leg  . PONV (postoperative nausea and vomiting)    shorter surgeries tends to have n/v.     Surgeries: Procedure(s): LEFT SIDED LUMBAR 5-SACRUM 1 TRANSFORAMINAL LUMBAR INTERBODY FUSION WITH INSTRUMENTATION AND ALLOGRAFT on 11/02/2016   Consultants: None  Discharged Condition: Improved  Hospital Course: Melanie Lecheronya T Betsill is an 55 y.o. female who was admitted 11/02/2016 for operative treatment of radiculopathy. Patient has severe unremitting pain that affects sleep, daily activities, and work/hobbies. After pre-op clearance the patient was taken to the operating room on 11/02/2016 and underwent  Procedure(s): LEFT SIDED LUMBAR 5-SACRUM 1 TRANSFORAMINAL LUMBAR INTERBODY FUSION WITH INSTRUMENTATION AND ALLOGRAFT.    Patient was given perioperative antibiotics:  Anti-infectives    Start     Dose/Rate Route Frequency Ordered Stop   11/02/16 2300  ceFAZolin (ANCEF) IVPB 1 g/50 mL premix     1 g 100 mL/hr over 30 Minutes Intravenous Every 8 hours 11/02/16 1940 11/03/16 0534   11/02/16 1145  ceFAZolin (ANCEF) IVPB 2g/100 mL premix     2 g 200 mL/hr over 30 Minutes Intravenous On call to O.R. 11/02/16 0905 11/02/16 1625       Patient was given  sequential compression devices, early ambulation to prevent DVT.  Patient benefited maximally from hospital stay and there were no complications.    Recent vital signs: BP 135/86 (BP Location: Right Arm)   Pulse 94   Temp 98.5 F (36.9 C) (Oral)   Resp 20   Ht 5' 5.5" (1.664 m)   Wt 83.9 kg (185 lb)   SpO2 96%   BMI 30.32 kg/m   Discharge Medications:     Medication List    TAKE these medications   amLODipine 5 MG tablet Commonly known as:  NORVASC Take 5 mg by mouth daily.   atorvastatin 20 MG tablet Commonly known as:  LIPITOR Take 20 mg by mouth daily.   carisoprodol 350 MG tablet Commonly known as:  SOMA Take 350 mg by mouth 4 (four) times daily as needed for muscle spasms.   conjugated estrogens vaginal cream Commonly known as:  PREMARIN Place 1 Applicatorful vaginally daily.   ergocalciferol 50000 units capsule Commonly known as:  VITAMIN D2 Take 50,000 Units by mouth once a week.   estradiol 0.1 MG/GM vaginal cream Commonly known as:  ESTRACE VAGINAL Place 1 Applicatorful vaginally at bedtime.   HYDROcodone-acetaminophen 10-325 MG tablet Commonly known as:  NORCO Take 1 tablet by mouth every 6 (six) hours as needed for moderate pain.   omeprazole 20 MG capsule Commonly known as:  PRILOSEC Take 20 mg by mouth daily.   oxyCODONE 20 mg  12 hr tablet Commonly known as:  OXYCONTIN Take 20 mg by mouth every 12 (twelve) hours.   sertraline 100 MG tablet Commonly known as:  ZOLOFT Take 100 mg by mouth daily.   telmisartan-hydrochlorothiazide 80-25 MG tablet Commonly known as:  MICARDIS HCT Take 1 tablet by mouth daily.   varenicline 1 MG tablet Commonly known as:  CHANTIX Take 1 mg by mouth 2 (two) times daily.   XANAX 1 MG tablet Generic drug:  ALPRAZolam Take 1 mg by mouth 2 (two) times daily as needed for anxiety.       Diagnostic Studies: Dg Chest 2 View  Result Date: 10/31/2016 CLINICAL DATA:  Preoperative study. EXAM: CHEST  2 VIEW  COMPARISON:  08/17/2016. FINDINGS: Mediastinum hilar structures normal. Lungs are clear. Heart size normal. No pleural effusion or pneumothorax. No acute bony abnormality. Chest stable from prior exam. IMPRESSION: Stable chest.  No acute cardiopulmonary disease . Electronically Signed   By: Maisie Fushomas  Register   On: 10/31/2016 16:44   Dg Lumbar Spine 2-3 Views  Result Date: 11/02/2016 CLINICAL DATA:  L5-S1 TLIF EXAM: LUMBAR SPINE - 2-3 VIEW; DG C-ARM GT 120 MIN COMPARISON:  None. FLUOROSCOPY TIME:  Fluoroscopy Time:  54 seconds Radiation Exposure Index (if provided by the fluoroscopic device): Not available Number of Acquired Spot Images: 2 FINDINGS: Pedicle screws are noted at L5 and S1 with posterior fixation. Interbody fusion is noted eccentric to the left IMPRESSION: L5-S1 fusion Electronically Signed   By: Alcide CleverMark  Lukens M.D.   On: 11/02/2016 16:47   Dg C-arm Gt 120 Min  Result Date: 11/02/2016 CLINICAL DATA:  L5-S1 TLIF EXAM: LUMBAR SPINE - 2-3 VIEW; DG C-ARM GT 120 MIN COMPARISON:  None. FLUOROSCOPY TIME:  Fluoroscopy Time:  54 seconds Radiation Exposure Index (if provided by the fluoroscopic device): Not available Number of Acquired Spot Images: 2 FINDINGS: Pedicle screws are noted at L5 and S1 with posterior fixation. Interbody fusion is noted eccentric to the left IMPRESSION: L5-S1 fusion Electronically Signed   By: Alcide CleverMark  Lukens M.D.   On: 11/02/2016 16:47    Disposition: 01-Home or Self Care   POD #1 s/p right L5/S1 revision decompression and fusion, doing very well  - up with PT/OT, encourage ambulation - Percocet for pain, Valium for muscle spasms - patient will f/u Saturday morning for likely d/c of drain -Written scripts for pain signed and in chart -D/C instructions sheet printed and in chart -D/C today  -F/U in office 2 weeks   Signed: Georga BoraMCKENZIE, Boysie Bonebrake J 11/09/2016, 1:40 PM

## 2016-11-15 DIAGNOSIS — M5416 Radiculopathy, lumbar region: Secondary | ICD-10-CM | POA: Diagnosis not present

## 2016-11-15 DIAGNOSIS — Z981 Arthrodesis status: Secondary | ICD-10-CM | POA: Diagnosis not present

## 2016-11-30 DIAGNOSIS — G894 Chronic pain syndrome: Secondary | ICD-10-CM | POA: Diagnosis not present

## 2016-11-30 DIAGNOSIS — M5137 Other intervertebral disc degeneration, lumbosacral region: Secondary | ICD-10-CM | POA: Diagnosis not present

## 2016-11-30 DIAGNOSIS — M47817 Spondylosis without myelopathy or radiculopathy, lumbosacral region: Secondary | ICD-10-CM | POA: Diagnosis not present

## 2016-11-30 DIAGNOSIS — M79606 Pain in leg, unspecified: Secondary | ICD-10-CM | POA: Diagnosis not present

## 2016-11-30 DIAGNOSIS — Z79899 Other long term (current) drug therapy: Secondary | ICD-10-CM | POA: Diagnosis not present

## 2016-11-30 DIAGNOSIS — Z79891 Long term (current) use of opiate analgesic: Secondary | ICD-10-CM | POA: Diagnosis not present

## 2016-12-12 DIAGNOSIS — M4807 Spinal stenosis, lumbosacral region: Secondary | ICD-10-CM | POA: Diagnosis not present

## 2016-12-28 DIAGNOSIS — Z79891 Long term (current) use of opiate analgesic: Secondary | ICD-10-CM | POA: Diagnosis not present

## 2016-12-28 DIAGNOSIS — M47817 Spondylosis without myelopathy or radiculopathy, lumbosacral region: Secondary | ICD-10-CM | POA: Diagnosis not present

## 2016-12-28 DIAGNOSIS — G894 Chronic pain syndrome: Secondary | ICD-10-CM | POA: Diagnosis not present

## 2016-12-28 DIAGNOSIS — Z79899 Other long term (current) drug therapy: Secondary | ICD-10-CM | POA: Diagnosis not present

## 2016-12-28 DIAGNOSIS — M79606 Pain in leg, unspecified: Secondary | ICD-10-CM | POA: Diagnosis not present

## 2016-12-28 DIAGNOSIS — M5137 Other intervertebral disc degeneration, lumbosacral region: Secondary | ICD-10-CM | POA: Diagnosis not present

## 2016-12-30 DIAGNOSIS — R7301 Impaired fasting glucose: Secondary | ICD-10-CM | POA: Diagnosis not present

## 2016-12-30 DIAGNOSIS — E782 Mixed hyperlipidemia: Secondary | ICD-10-CM | POA: Diagnosis not present

## 2016-12-30 DIAGNOSIS — I10 Essential (primary) hypertension: Secondary | ICD-10-CM | POA: Diagnosis not present

## 2017-01-02 DIAGNOSIS — R7301 Impaired fasting glucose: Secondary | ICD-10-CM | POA: Diagnosis not present

## 2017-01-02 DIAGNOSIS — E782 Mixed hyperlipidemia: Secondary | ICD-10-CM | POA: Diagnosis not present

## 2017-01-02 DIAGNOSIS — I10 Essential (primary) hypertension: Secondary | ICD-10-CM | POA: Diagnosis not present

## 2017-01-02 DIAGNOSIS — Z23 Encounter for immunization: Secondary | ICD-10-CM | POA: Diagnosis not present

## 2017-01-02 DIAGNOSIS — Z0001 Encounter for general adult medical examination with abnormal findings: Secondary | ICD-10-CM | POA: Diagnosis not present

## 2017-01-25 DIAGNOSIS — G894 Chronic pain syndrome: Secondary | ICD-10-CM | POA: Diagnosis not present

## 2017-01-25 DIAGNOSIS — M545 Low back pain: Secondary | ICD-10-CM | POA: Diagnosis not present

## 2017-01-25 DIAGNOSIS — Z79899 Other long term (current) drug therapy: Secondary | ICD-10-CM | POA: Diagnosis not present

## 2017-01-25 DIAGNOSIS — M5137 Other intervertebral disc degeneration, lumbosacral region: Secondary | ICD-10-CM | POA: Diagnosis not present

## 2017-01-25 DIAGNOSIS — M47817 Spondylosis without myelopathy or radiculopathy, lumbosacral region: Secondary | ICD-10-CM | POA: Diagnosis not present

## 2017-01-25 DIAGNOSIS — Z79891 Long term (current) use of opiate analgesic: Secondary | ICD-10-CM | POA: Diagnosis not present

## 2017-01-30 DIAGNOSIS — M4807 Spinal stenosis, lumbosacral region: Secondary | ICD-10-CM | POA: Diagnosis not present

## 2017-02-02 ENCOUNTER — Ambulatory Visit (HOSPITAL_COMMUNITY): Payer: BLUE CROSS/BLUE SHIELD | Attending: Orthopedic Surgery

## 2017-02-02 DIAGNOSIS — M545 Low back pain, unspecified: Secondary | ICD-10-CM

## 2017-02-02 DIAGNOSIS — R2681 Unsteadiness on feet: Secondary | ICD-10-CM | POA: Insufficient documentation

## 2017-02-02 DIAGNOSIS — R279 Unspecified lack of coordination: Secondary | ICD-10-CM | POA: Diagnosis not present

## 2017-02-02 DIAGNOSIS — G8929 Other chronic pain: Secondary | ICD-10-CM | POA: Insufficient documentation

## 2017-02-02 DIAGNOSIS — M6281 Muscle weakness (generalized): Secondary | ICD-10-CM | POA: Insufficient documentation

## 2017-02-02 NOTE — Therapy (Signed)
[facility] [address] [place], [place], [address] Phone: [phone]   Fax:  [phone]  Physical Therapy Evaluation  Patient Details  Name: Melanie Avila MRN: [id number] Date of Birth: 04/17/1961 Referring Provider: Estill BambergMark Dumonski   Encounter Date: 02/02/2017      PT End of Session - 02/02/17 1555    Visit Number 1   Number of Visits 8   Date for PT Re-Evaluation 03/05/17   Authorization Type [employer]   Authorization Time Period 02/02/17-03/05/17   PT Start Time 1518   PT Stop Time 1550   PT Time Calculation (min) 32 min   Activity Tolerance Patient tolerated treatment well;No increased pain   Behavior During Therapy WFL for tasks assessed/performed      Past Medical History:  Diagnosis Date  . Anemia   . Anxiety   . Complication of anesthesia    last shoulder surg 2010-dsc-2 hr after block to surg-neck swelled-hard to tube-stayed RCC  . DDD (degenerative disc disease)   . Depression   . Fibromyalgia   . GERD (gastroesophageal reflux disease)   . Headache    migraines in her younger years  . Heart murmur    since birth and never has been a problem  . History of kidney stones    7 times  . Hyperlipidemia   . Hypertension   . Neuromuscular disorder (HCC)    nerve damage left leg  . PONV (postoperative nausea and vomiting)    shorter surgeries tends to have n/v.     Past Surgical History:  Procedure Laterality Date  . BACK SURGERY  89,02   lumbar x2  . CARPAL TUNNEL RELEASE Bilateral   . COLONOSCOPY N/A 12/09/2015   Procedure: COLONOSCOPY;  Surgeon: Malissa HippoNajeeb U Rehman, MD;  Location: [facility];  Service: Endoscopy;  Laterality: N/A;  830  . SHOULDER ARTHROSCOPY WITH SUBACROMIAL DECOMPRESSION  10/23/2012   Procedure: SHOULDER ARTHROSCOPY WITH SUBACROMIAL DECOMPRESSION;  Surgeon: Wyn Forsterobert V Sypher Jr., MD;  Location: [facility];  Service: Orthopedics;  Laterality: Left;  LEFT SHOULDER  ARTHROSCOPY WITH  SUBACROMIAL DECOMPRESSION, DISTAL CLAVICLE RESECTION, ARTHROSCOPIC SUBSCAPULARIS REPAIR AND OPEN REPAIR OF SUPRASPINATOUS TENDON  . SHOULDER SURGERY Bilateral   . TUBAL LIGATION      There were no vitals filed for this visit.       Subjective Assessment - 02/02/17 1523    Subjective Pt reports on 11/29 underwent lumbar fusion at L5/S1. Wore a bone stimulator for 2hrs daily for 4 weeks. Patient was on BLT precautions initially but pt reports she is no longer on any precautions.    Pertinent History 2 prior lumbar spine surgeries, history of FM seeing a pain specialist. Chronic history of loss of Left foot toe flexion in digits 3, 4, 5.    How long can you sit comfortably? Has tolerated 35 minute drives to [place] from appointments, with some subsequent stiffness in the back.    How long can you stand comfortably? Unlimited   How long can you walk comfortably? Unlimited    Patient Stated Goals Return to full work duties    Currently in Pain? Yes  History of FM, has pain everyday. Surgical pain is very low, she reports abotu 3/10.             [facility] - 02/02/17 0001      Assessment   Medical Diagnosis s/p L5/S1 laminectomy    Referring Provider Estill BambergMark Dumonski  Onset Date/Surgical Date 11/02/16   Next MD Visit --  Unsure at this time.    Prior Therapy None     Precautions   Precautions None  no longer on BLT precautions, cleared for full duty    Precaution Comments Returns to work on April 2nd.    Required Braces or Orthoses --  None     Balance Screen   Has the patient fallen in the past 6 months No   Has the patient had a decrease in activity level because of a fear of falling?  No   Is the patient reluctant to leave their home because of a fear of falling?  No     Prior Function   Level of Independence Independent   Vocation Full time employment   Vocation Requirements Continuous crouching down      Sensation   Light Touch Appears Intact  mild tingling in  left lateral calf that has been goign on f        Gait: Self-selected walking for 225 feet without assistive device nor increase in pain. 1.52m/s.   Strength Testing:  Seated MMT- hip flexion, internal rotation, external rotation, horizontal abduction, and adduction 5/5 bilat. Prone MMT: Hip extension 3/5 bilat (not tested with resistance at this time) and with Lt A/ROM 30-50% greater than left side. Hip Abduction is 3+/5 bilat.  Funcitonal- Heel walk and toe walk WNL bil  Single Leg Stance Balance:  Right Leg: 6.5 seconds  Left Leg: 13s   5xSTS:  7.29m/s hands free  Self Selected Gait Speed: 1.75m/s                   PT Education - 02/02/17 1548    Education provided Yes          PT Short Term Goals - 02/02/17 1601      PT SHORT TERM GOAL #1   Title After 4 weeks patient will demonstrate improved tolerance to gait AEB covering 1710ft withou exacerbation of pain.    Status New     PT SHORT TERM GOAL #2   Title After 4 weeks patient will demonstrate improved balance AEB SLS >60sec bilat.    Status New                  Plan - 02/02/17 1556    Clinical Impression Statement P presenting several months s/p lumbar fusion. At evalaution pt  has been release from all precautions. Pt is to return to work in about 1 month. Pt demonstrating some decreased gait speed, decreased balance (ankel strategy) , muscle weakness, and limited tolerance to sitting. Pt will benefit from skilled PT intervention to address these impairments and limitations to restore epatient to PLOF and return to work.    Rehab Potential Excellent   PT Frequency 2x / week   PT Duration 4 weeks   PT Treatment/Interventions Dry needling;Energy conservation;Moist Heat;Functional mobility training;Stair training;Therapeutic exercise;Therapeutic activities;Passive range of motion;Balance training;Manual techniques   PT Next Visit Plan Review HEP, Review treatment goals. Progress balance  training and closed chain glutes medius strengthening.    PT Home Exercise Plan At eval: Glute max bridge (wide base, end range knee flexion), SLS c hip flexion, SKTC stretch    Consulted and Agree with Plan of Care Patient      Patient will benefit from skilled therapeutic intervention in order to improve the following deficits and impairments:  Pain, Decreased activity tolerance, Decreased balance, Decreased strength, Decreased mobility  Visit Diagnosis: Chronic left-sided low back pain without sciatica  Unsteadiness on feet  Muscle weakness (generalized)     Problem List Patient Active Problem List   Diagnosis Date Noted  . Radiculopathy 11/02/2016  . Left leg pain 11/22/2014  . Fibromyalgia 01/30/2012  . Depression with anxiety 01/30/2012  . Lumbar post-laminectomy syndrome 01/30/2012  . Thoracic or lumbosacral neuritis or radiculitis, unspecified 01/30/2012    4:06 PM, 02/02/17 Rosamaria Lints, PT, DPT Physical Therapist at [facility] [phone] (office)     [facility] [address] [place], [place], [address] Phone: [phone]   Fax:  [phone]  Name: Melanie Avila MRN: [id number] Date of Birth: 1960-12-09

## 2017-02-09 ENCOUNTER — Ambulatory Visit (HOSPITAL_COMMUNITY): Payer: BLUE CROSS/BLUE SHIELD

## 2017-02-09 DIAGNOSIS — R2681 Unsteadiness on feet: Secondary | ICD-10-CM | POA: Diagnosis not present

## 2017-02-09 DIAGNOSIS — M545 Low back pain, unspecified: Secondary | ICD-10-CM

## 2017-02-09 DIAGNOSIS — R279 Unspecified lack of coordination: Secondary | ICD-10-CM | POA: Diagnosis not present

## 2017-02-09 DIAGNOSIS — G8929 Other chronic pain: Secondary | ICD-10-CM | POA: Diagnosis not present

## 2017-02-09 DIAGNOSIS — M6281 Muscle weakness (generalized): Secondary | ICD-10-CM | POA: Diagnosis not present

## 2017-02-09 NOTE — Therapy (Signed)
[facility] [address] [place], [place], [address] Phone: [phone]   Fax:  [phone]  Physical Therapy Treatment  Patient Details  Name: Melanie Avila MRN: [id number] Date of Birth: [birth year] Referring Provider: Estill Bamberg   Encounter Date: 02/09/2017      PT End of Session - 02/09/17 0825    Visit Number 2   Number of Visits 8   Date for PT Re-Evaluation 03/05/17   Authorization Type [employer]   Authorization Time Period 02/02/17-03/05/17   PT Start Time 0817   PT Stop Time 0900   PT Time Calculation (min) 43 min   Equipment Utilized During Treatment Gait belt   Activity Tolerance Patient tolerated treatment well;No increased pain   Behavior During Therapy WFL for tasks assessed/performed      Past Medical History:  Diagnosis Date  . Anemia   . Anxiety   . Complication of anesthesia    last shoulder surg 2010-dsc-2 hr after block to surg-neck swelled-hard to tube-stayed RCC  . DDD (degenerative disc disease)   . Depression   . Fibromyalgia   . GERD (gastroesophageal reflux disease)   . Headache    migraines in her younger years  . Heart murmur    since birth and never has been a problem  . History of kidney stones    7 times  . Hyperlipidemia   . Hypertension   . Neuromuscular disorder (HCC)    nerve damage left leg  . PONV (postoperative nausea and vomiting)    shorter surgeries tends to have n/v.     Past Surgical History:  Procedure Laterality Date  . BACK SURGERY  89,02   lumbar x2  . CARPAL TUNNEL RELEASE Bilateral   . COLONOSCOPY N/A 12/09/2015   Procedure: COLONOSCOPY;  Surgeon: Malissa Hippo, MD;  [facility];  Service: Endoscopy;  Laterality: N/A;  830  . SHOULDER ARTHROSCOPY WITH SUBACROMIAL DECOMPRESSION  10/23/2012   Procedure: SHOULDER ARTHROSCOPY WITH SUBACROMIAL DECOMPRESSION;  Surgeon: Wyn Forster., MD;  Location: [facility];  Service: Orthopedics;  Laterality:  Left;  LEFT SHOULDER  ARTHROSCOPY WITH SUBACROMIAL DECOMPRESSION, DISTAL CLAVICLE RESECTION, ARTHROSCOPIC SUBSCAPULARIS REPAIR AND OPEN REPAIR OF SUPRASPINATOUS TENDON  . SHOULDER SURGERY Bilateral   . TUBAL LIGATION      There were no vitals filed for this visit.      Subjective Assessment - 02/09/17 0822    Subjective Pt stated she was super stiff and has difficulty earlier in the morning.  Current pain scale 6/10 all over pain due to fibromyalgia with increased stiffness, achey and soreness this today.   Pertinent History 2 prior lumbar spine surgeries, history of FM seeing a pain specialist. Chronic history of loss of Left foot toe flexion in digits 3, 4, 5.    Patient Stated Goals Return to full work duties    Currently in Pain? Yes   Pain Score 6    Pain Location Generalized   Pain Descriptors / Indicators Aching;Sore  Stiff   Pain Type Chronic pain   Pain Onset More than a month ago   Pain Frequency Constant   Aggravating Factors  sweeping, dishes, standing in one spot   Pain Relieving Factors movement, pain meds, ice/heat as needed   Effect of Pain on Daily Activities Decrease ADLs                         [facility]  PT Treatment/Exercise - 02/09/17 0001      Exercises   Exercises Lumbar     Lumbar Exercises: Stretches   Single Knee to Chest Stretch 3 reps;30 seconds     Bridge 2 sets x 10 reps        Balance Exercises - 02/09/17 0901      Balance Exercises: Standing   Tandem Stance Eyes open;Foam/compliant surface;3 reps;30 secs   SLS Eyes open;Solid surface;3 reps  Rt 7", Lt 6" max of 3   Sidestepping 2 reps;Theraband  RTB   Marching Limitations 10x3" with 1 finger A   Heel Raises Limitations 10   Toe Raise Limitations 10           PT Education - 02/09/17 0834    Education provided Yes   Education Details Reviewed goals, assured correct form and compliance with HEP, copy of eval given to pt.     Person(s) Educated Patient    Methods Explanation;Demonstration;Handout   Comprehension Verbalized understanding;Returned demonstration          PT Short Term Goals - 02/02/17 1601      PT SHORT TERM GOAL #1   Title After 4 weeks patient will demonstrate improved tolerance to gait AEB 6MWT covering 176400ft withou exacerbation of pain.    Status New     PT SHORT TERM GOAL #2   Title After 4 weeks patient will demonstrate improved balance AEB SLS >60sec bilat.    Status New                  Plan - 02/09/17 0830    Clinical Impression Statement Reviewed goals, assured compliance and proper form/technique with HEP and copy of eval given to pt.  Pt able to demonstrate appropriate form and technqiue with HEP following min cueing for form.  Pt limited by generalized stiffness, soreness and achey through session, pt educated on symptoms to expect following beginning of therapy/exercises.  Min A with balance activities with focus on ankle strengthening stragety and gluteal strengthening.  EOS pt reports decreased overall stiffness, pain scale 5/10 generalize pain.     Rehab Potential Excellent   PT Frequency 2x / week   PT Duration 4 weeks   PT Treatment/Interventions Dry needling;Energy conservation;Moist Heat;Functional mobility training;Stair training;Therapeutic exercise;Therapeutic activities;Passive range of motion;Balance training;Manual techniques   PT Next Visit Plan Progress balance training and closed chain glutes medius strengthening.    PT Home Exercise Plan At eval: Glute max bridge (wide base, end range knee flexion), SLS c hip flexion, SKTC stretch       Patient will benefit from skilled therapeutic intervention in order to improve the following deficits and impairments:  Pain, Decreased activity tolerance, Decreased balance, Decreased strength, Decreased mobility  Visit Diagnosis: Chronic left-sided low back pain without sciatica  Unsteadiness on feet  Muscle weakness  (generalized)     Problem List Patient Active Problem List   Diagnosis Date Noted  . Radiculopathy 11/02/2016  . Left leg pain 11/22/2014  . Fibromyalgia 01/30/2012  . Depression with anxiety 01/30/2012  . Lumbar post-laminectomy syndrome 01/30/2012  . Thoracic or lumbosacral neuritis or radiculitis, unspecified 01/30/2012    Juel BurrowCockerham, Casey Jo 02/09/2017, 9:07 AM   [facility] [address] , [place], [address] Phone: [phone]   Fax:  [phone]  Name: Melanie Avila MRN: [id number] Date of Birth: 04/26/1961

## 2017-02-17 ENCOUNTER — Ambulatory Visit (HOSPITAL_COMMUNITY): Payer: BLUE CROSS/BLUE SHIELD

## 2017-02-17 DIAGNOSIS — M545 Low back pain, unspecified: Secondary | ICD-10-CM

## 2017-02-17 DIAGNOSIS — G8929 Other chronic pain: Secondary | ICD-10-CM | POA: Diagnosis not present

## 2017-02-17 DIAGNOSIS — M6281 Muscle weakness (generalized): Secondary | ICD-10-CM

## 2017-02-17 DIAGNOSIS — R2681 Unsteadiness on feet: Secondary | ICD-10-CM | POA: Diagnosis not present

## 2017-02-17 DIAGNOSIS — R279 Unspecified lack of coordination: Secondary | ICD-10-CM | POA: Diagnosis not present

## 2017-02-17 NOTE — Therapy (Signed)
[facility] [address] [place], [place], [address] Phone: [phone]   Fax:  [phone]  Physical Therapy Treatment  Patient Details  Name: Melanie Avila MRN: [id number] Date of Birth: 10/20/1961 Referring Provider: Estill BambergMark Dumonski   Encounter Date: 02/17/2017      PT End of Session - 02/17/17 1528    Visit Number 3   Number of Visits 8   Date for PT Re-Evaluation 03/05/17   Authorization Time Period 02/02/17-03/05/17   PT Start Time 1521   PT Stop Time 1559   PT Time Calculation (min) 38 min   Equipment Utilized During Treatment Gait belt   Activity Tolerance Patient tolerated treatment well;No increased pain   Behavior During Therapy WFL for tasks assessed/performed      Past Medical History:  Diagnosis Date  . Anemia   . Anxiety   . Complication of anesthesia    last shoulder surg 2010-dsc-2 hr after block to surg-neck swelled-hard to tube-stayed RCC  . DDD (degenerative disc disease)   . Depression   . Fibromyalgia   . GERD (gastroesophageal reflux disease)   . Headache    migraines in her younger years  . Heart murmur    since birth and never has been a problem  . History of kidney stones    7 times  . Hyperlipidemia   . Hypertension   . Neuromuscular disorder (HCC)    nerve damage left leg  . PONV (postoperative nausea and vomiting)    shorter surgeries tends to have n/v.     Past Surgical History:  Procedure Laterality Date  . BACK SURGERY  89,02   lumbar x2  . CARPAL TUNNEL RELEASE Bilateral   . COLONOSCOPY N/A 12/09/2015   Procedure: COLONOSCOPY;  Surgeon: Malissa HippoNajeeb U Rehman, MD;  [facility];  Service: Endoscopy;  Laterality: N/A;  830  . SHOULDER ARTHROSCOPY WITH SUBACROMIAL DECOMPRESSION  10/23/2012   Procedure: SHOULDER ARTHROSCOPY WITH SUBACROMIAL DECOMPRESSION;  Surgeon: Wyn Forsterobert V Sypher Jr., MD;  Location: [facility];  Service: Orthopedics;  Laterality: Left;  LEFT SHOULDER   ARTHROSCOPY WITH SUBACROMIAL DECOMPRESSION, DISTAL CLAVICLE RESECTION, ARTHROSCOPIC SUBSCAPULARIS REPAIR AND OPEN REPAIR OF SUPRASPINATOUS TENDON  . SHOULDER SURGERY Bilateral   . TUBAL LIGATION      There were no vitals filed for this visit.      Subjective Assessment - 02/17/17 1523    Subjective Pt stated her back is feeling today, just tired today.  Reports increased pain Bil hands are stiff and swollen.     Pertinent History 2 prior lumbar spine surgeries, history of FM seeing a pain specialist. Chronic history of loss of Left foot toe flexion in digits 3, 4, 5.    Patient Stated Goals Return to full work duties    Currently in Pain? Yes   Pain Score 7    Pain Location Generalized  lower back and hands   Pain Descriptors / Indicators Sore  hands swollen with increased pain with joint movements   Pain Type Chronic pain   Pain Onset More than a month ago   Pain Frequency Constant   Aggravating Factors  sweeping, dishes, standing in one spot   Pain Relieving Factors movement, pain meds, ice/heat as needed   Effect of Pain on Daily Activities Decreased ADLs                  Balance Exercises - 02/17/17 1545      Balance Exercises:  Standing   Tandem Stance Eyes open;Foam/compliant surface;3 reps;30 secs   SLS Eyes open;Solid surface;3 reps  Lt 24, Rt 29"   SLS with Vectors Solid surface;3 reps;Intermittent upper extremity assist  BLE 3x5"   Tandem Gait 2 reps   Sidestepping 2 reps;Theraband  RTB   Heel Raises Limitations 15   Toe Raise Limitations 15             PT Short Term Goals - 02/02/17 1601      PT SHORT TERM GOAL #1   Title After 4 weeks patient will demonstrate improved tolerance to gait AEB covering 1785ft withou exacerbation of pain.    Status New     PT SHORT TERM GOAL #2   Title After 4 weeks patient will demonstrate improved balance AEB SLS >60sec bilat.    Status New                  Plan - 02/17/17 1528    Clinical  Impression Statement Pt reports increased generalized pain she feels releated to fibromyalgia, reports of compliance with HEP and no reports of radicular symptoms upon entrance.  Session focus on balance activities and gluteal strengthening.  Pt with increased difficulty with static balance activities requiring min A for safety.  Pt has some toes that are hyperextended feels increased difficulty with balance tasks.  Improved SLS since last session.  Added vector stance to improve SLS stability and gluteal strengthening.  Min guard with balance gait activities following cueing for focal point.  No reports of pain through session, was limited by fatigue.     Rehab Potential Excellent   PT Frequency 2x / week   PT Duration 4 weeks   PT Treatment/Interventions Dry needling;Energy conservation;Moist Heat;Functional mobility training;Stair training;Therapeutic exercise;Therapeutic activities;Passive range of motion;Balance training;Manual techniques   PT Next Visit Plan Progress balance training and closed chain glutes medius strengthening.  Begin side stepping over hurdles next session.     PT Home Exercise Plan At eval: Glute max bridge (wide base, end range knee flexion), SLS c hip flexion, SKTC stretch       Patient will benefit from skilled therapeutic intervention in order to improve the following deficits and impairments:  Pain, Decreased activity tolerance, Decreased balance, Decreased strength, Decreased mobility  Visit Diagnosis: Chronic left-sided low back pain without sciatica  Unsteadiness on feet  Muscle weakness (generalized)  Lack of coordination     Problem List Patient Active Problem List   Diagnosis Date Noted  . Radiculopathy 11/02/2016  . Left leg pain 11/22/2014  . Fibromyalgia 01/30/2012  . Depression with anxiety 01/30/2012  . Lumbar post-laminectomy syndrome 01/30/2012  . Thoracic or lumbosacral neuritis or radiculitis, unspecified 01/30/2012   Becky Sax,  LPTA; CBIS [phone]  Juel Burrow 02/17/2017, 4:04 PM  [facility] [address] [place], [place], [address] Phone: [phone]   Fax:  [phone]  Name: ZIAN MOHAMED MRN: [id number] Date of Birth: 12/31/1960

## 2017-02-20 ENCOUNTER — Ambulatory Visit (HOSPITAL_COMMUNITY): Payer: BLUE CROSS/BLUE SHIELD | Admitting: Physical Therapy

## 2017-02-20 DIAGNOSIS — M545 Low back pain, unspecified: Secondary | ICD-10-CM

## 2017-02-20 DIAGNOSIS — R2681 Unsteadiness on feet: Secondary | ICD-10-CM

## 2017-02-20 DIAGNOSIS — M6281 Muscle weakness (generalized): Secondary | ICD-10-CM | POA: Diagnosis not present

## 2017-02-20 DIAGNOSIS — G8929 Other chronic pain: Secondary | ICD-10-CM | POA: Diagnosis not present

## 2017-02-20 DIAGNOSIS — R279 Unspecified lack of coordination: Secondary | ICD-10-CM
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
## 2017-02-22 DIAGNOSIS — M79606 Pain in leg, unspecified: Secondary | ICD-10-CM | POA: Diagnosis not present

## 2017-02-22 DIAGNOSIS — M47817 Spondylosis without myelopathy or radiculopathy, lumbosacral region: Secondary | ICD-10-CM | POA: Diagnosis not present

## 2017-02-22 DIAGNOSIS — G894 Chronic pain syndrome: Secondary | ICD-10-CM | POA: Diagnosis not present

## 2017-02-22 DIAGNOSIS — Z79899 Other long term (current) drug therapy: Secondary | ICD-10-CM | POA: Diagnosis not present

## 2017-02-22 DIAGNOSIS — M5137 Other intervertebral disc degeneration, lumbosacral region: Secondary | ICD-10-CM | POA: Diagnosis not present

## 2017-02-22 DIAGNOSIS — Z79891 Long term (current) use of opiate analgesic: Secondary | ICD-10-CM | POA: Diagnosis not present

## 2017-02-24 ENCOUNTER — Ambulatory Visit (HOSPITAL_COMMUNITY): Payer: BLUE CROSS/BLUE SHIELD | Admitting: Physical Therapy

## 2017-02-24 ENCOUNTER — Telehealth (HOSPITAL_COMMUNITY): Payer: Self-pay | Admitting: Physical Therapy

## 2017-02-24 NOTE — Telephone Encounter (Signed)
Pt has stomach flu and can not come today

## 2017-02-27 ENCOUNTER — Ambulatory Visit (HOSPITAL_COMMUNITY): Payer: BLUE CROSS/BLUE SHIELD | Admitting: Physical Therapy

## 2017-02-27 ENCOUNTER — Telehealth (HOSPITAL_COMMUNITY): Payer: Self-pay | Admitting: Physical Therapy

## 2017-02-27 NOTE — Telephone Encounter (Signed)
She has the flu and can not come today

## 2017-03-03 ENCOUNTER — Ambulatory Visit (HOSPITAL_COMMUNITY): Payer: BLUE CROSS/BLUE SHIELD

## 2017-03-03 ENCOUNTER — Telehealth (HOSPITAL_COMMUNITY): Payer: Self-pay | Admitting: Internal Medicine
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
## 2017-03-06 ENCOUNTER — Encounter (HOSPITAL_COMMUNITY): Payer: BLUE CROSS/BLUE SHIELD | Admitting: Physical Therapy

## 2017-03-09 DIAGNOSIS — G609 Hereditary and idiopathic neuropathy, unspecified: Secondary | ICD-10-CM | POA: Diagnosis not present

## 2017-03-09 DIAGNOSIS — M792 Neuralgia and neuritis, unspecified: Secondary | ICD-10-CM | POA: Diagnosis not present

## 2017-03-09 DIAGNOSIS — G894 Chronic pain syndrome: Secondary | ICD-10-CM | POA: Diagnosis not present

## 2017-03-09 DIAGNOSIS — F4542 Pain disorder with related psychological factors: Secondary | ICD-10-CM | POA: Diagnosis not present

## 2017-03-28 DIAGNOSIS — M47817 Spondylosis without myelopathy or radiculopathy, lumbosacral region: Secondary | ICD-10-CM | POA: Diagnosis not present

## 2017-03-28 DIAGNOSIS — M5137 Other intervertebral disc degeneration, lumbosacral region: Secondary | ICD-10-CM | POA: Diagnosis not present

## 2017-03-28 DIAGNOSIS — G894 Chronic pain syndrome: Secondary | ICD-10-CM | POA: Diagnosis not present

## 2017-03-28 DIAGNOSIS — M79643 Pain in unspecified hand: Secondary | ICD-10-CM | POA: Diagnosis not present

## 2017-04-06 DIAGNOSIS — M19049 Primary osteoarthritis, unspecified hand: Secondary | ICD-10-CM | POA: Diagnosis not present

## 2017-04-06 DIAGNOSIS — M79643 Pain in unspecified hand: Secondary | ICD-10-CM | POA: Diagnosis not present

## 2017-04-21 IMAGING — DX DG HIP (WITH OR WITHOUT PELVIS) 2-3V*L*
3 series · 3 of 3 positions shown · non-contrast
Comparison: None.

CLINICAL DATA: 54-year-old presenting with acute onset of low back
pain and left hip pain which began last night. No recent injuries.

EXAM:
DG HIP (WITH OR WITHOUT PELVIS) 2-3V LEFT

[pelvis ap]
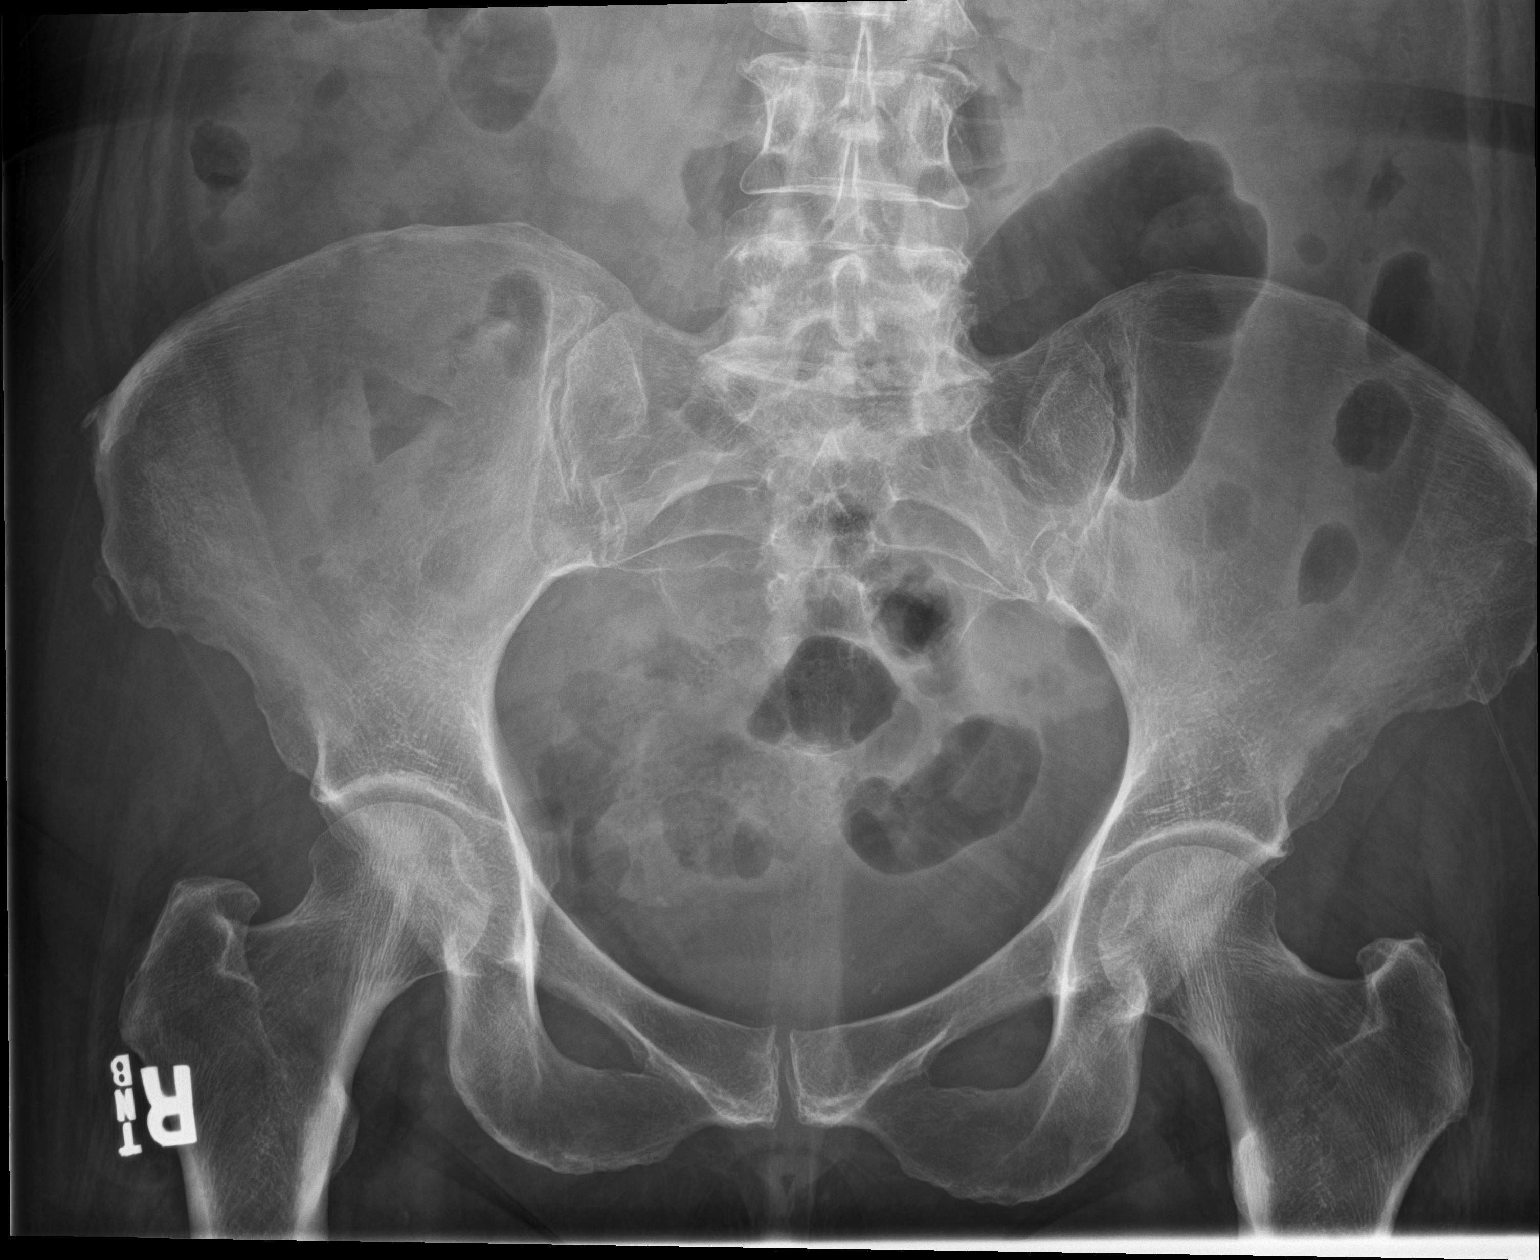

[hip ap]
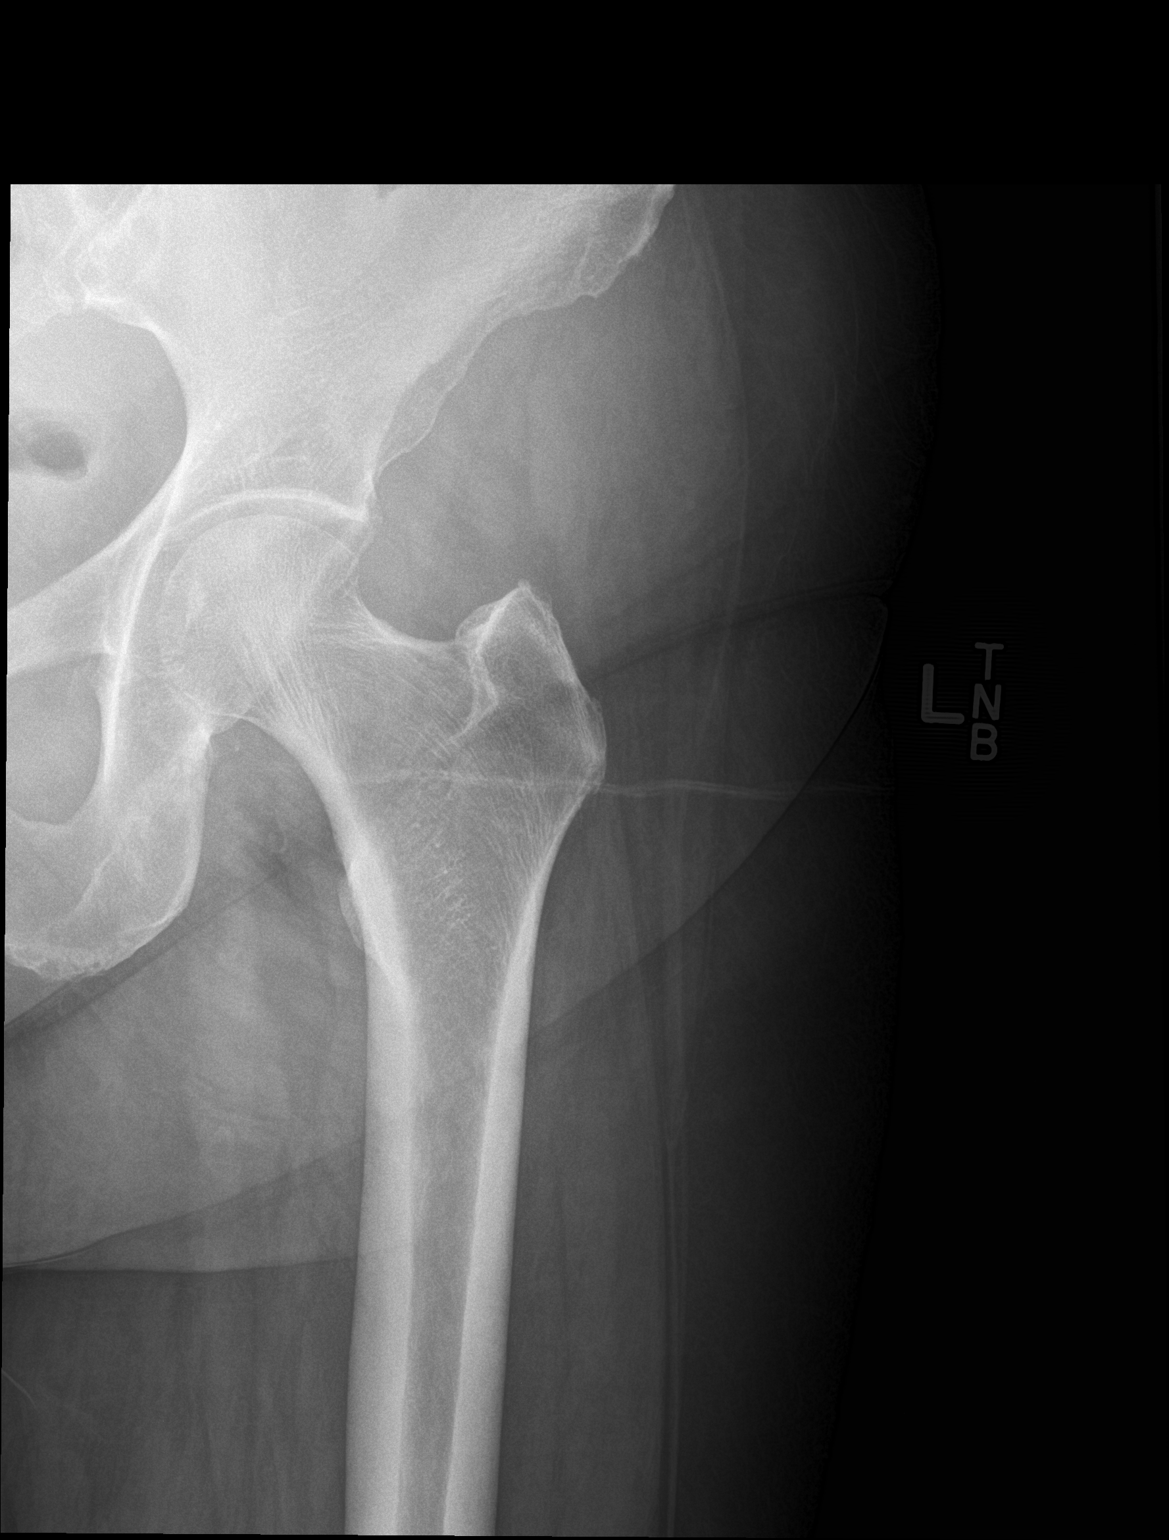

[hip lat]
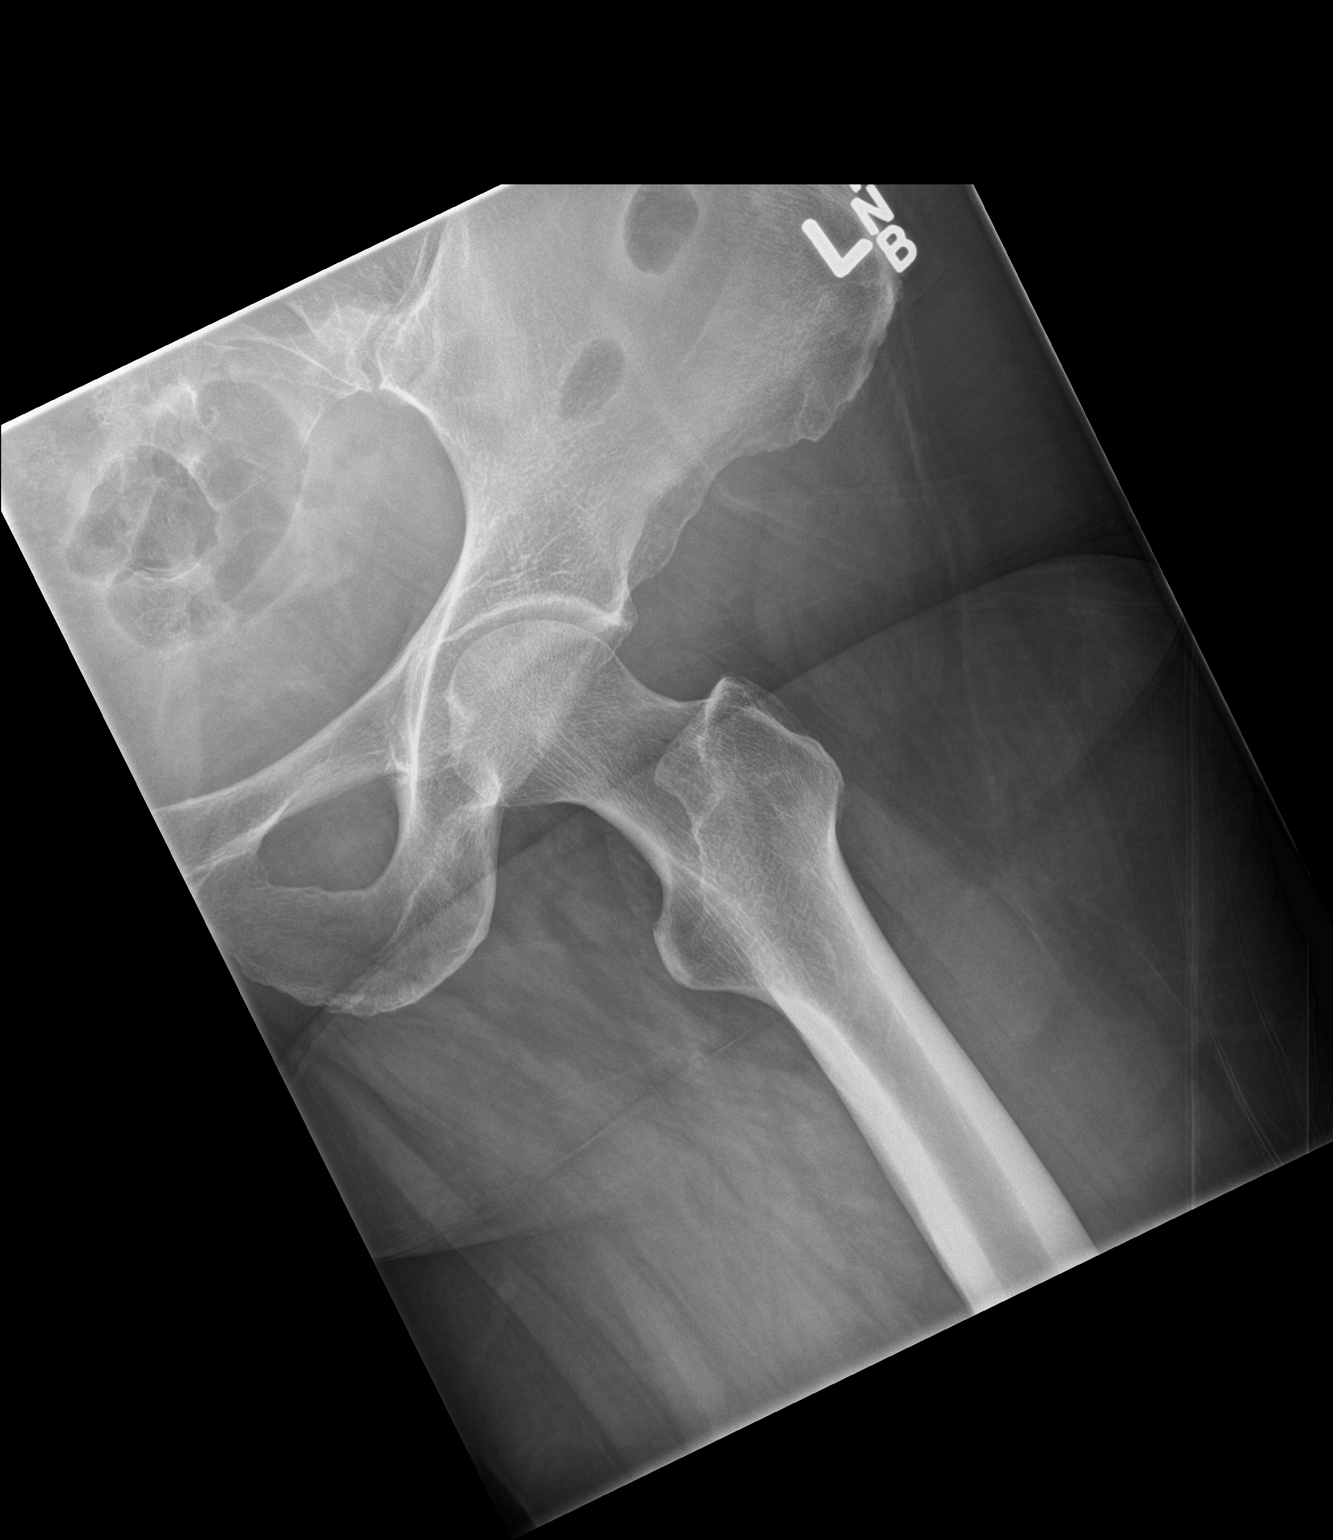

[3 of 3 positions shown; findings below may reference images not displayed]

FINDINGS: No evidence of acute or subacute fracture or dislocation.
Well-preserved joint space. Well-preserved bone mineral density.

Included AP pelvis demonstrates a normal-appearing contralateral
right hip. Sacroiliac joints and symphysis pubis intact. No
intrinsic osseous abnormality.
IMPRESSION: Normal examination.

## 2017-04-25 DIAGNOSIS — G894 Chronic pain syndrome: Secondary | ICD-10-CM | POA: Diagnosis not present

## 2017-04-25 DIAGNOSIS — M5137 Other intervertebral disc degeneration, lumbosacral region: Secondary | ICD-10-CM | POA: Diagnosis not present

## 2017-04-25 DIAGNOSIS — M47817 Spondylosis without myelopathy or radiculopathy, lumbosacral region: Secondary | ICD-10-CM | POA: Diagnosis not present

## 2017-04-25 DIAGNOSIS — Z79899 Other long term (current) drug therapy: Secondary | ICD-10-CM | POA: Diagnosis not present

## 2017-04-25 DIAGNOSIS — M79643 Pain in unspecified hand: Secondary | ICD-10-CM | POA: Diagnosis not present

## 2017-04-25 DIAGNOSIS — Z79891 Long term (current) use of opiate analgesic: Secondary | ICD-10-CM | POA: Diagnosis not present

## 2017-04-28 DIAGNOSIS — M542 Cervicalgia: Secondary | ICD-10-CM | POA: Diagnosis not present

## 2017-04-28 DIAGNOSIS — M545 Low back pain: Secondary | ICD-10-CM | POA: Diagnosis not present

## 2017-04-28 DIAGNOSIS — M546 Pain in thoracic spine: Secondary | ICD-10-CM | POA: Diagnosis not present

## 2017-05-16 DIAGNOSIS — Z1231 Encounter for screening mammogram for malignant neoplasm of breast: Secondary | ICD-10-CM | POA: Diagnosis not present

## 2017-05-18 DIAGNOSIS — M7989 Other specified soft tissue disorders: Secondary | ICD-10-CM | POA: Diagnosis not present

## 2017-05-18 DIAGNOSIS — R5383 Other fatigue: Secondary | ICD-10-CM | POA: Diagnosis not present

## 2017-05-18 DIAGNOSIS — M797 Fibromyalgia: Secondary | ICD-10-CM | POA: Diagnosis not present

## 2017-05-18 DIAGNOSIS — M5136 Other intervertebral disc degeneration, lumbar region: Secondary | ICD-10-CM | POA: Diagnosis not present

## 2017-05-18 DIAGNOSIS — M2550 Pain in unspecified joint: Secondary | ICD-10-CM | POA: Diagnosis not present

## 2017-05-23 DIAGNOSIS — M79606 Pain in leg, unspecified: Secondary | ICD-10-CM | POA: Diagnosis not present

## 2017-05-23 DIAGNOSIS — Z79891 Long term (current) use of opiate analgesic: Secondary | ICD-10-CM | POA: Diagnosis not present

## 2017-05-23 DIAGNOSIS — G894 Chronic pain syndrome: Secondary | ICD-10-CM | POA: Diagnosis not present

## 2017-05-23 DIAGNOSIS — M5137 Other intervertebral disc degeneration, lumbosacral region: Secondary | ICD-10-CM | POA: Diagnosis not present

## 2017-05-23 DIAGNOSIS — Z79899 Other long term (current) drug therapy: Secondary | ICD-10-CM | POA: Diagnosis not present

## 2017-05-23 DIAGNOSIS — M47817 Spondylosis without myelopathy or radiculopathy, lumbosacral region: Secondary | ICD-10-CM | POA: Diagnosis not present

## 2017-06-01 DIAGNOSIS — M5136 Other intervertebral disc degeneration, lumbar region: Secondary | ICD-10-CM | POA: Diagnosis not present

## 2017-06-01 DIAGNOSIS — M797 Fibromyalgia: Secondary | ICD-10-CM | POA: Diagnosis not present

## 2017-06-01 DIAGNOSIS — M2550 Pain in unspecified joint: Secondary | ICD-10-CM | POA: Diagnosis not present

## 2017-06-20 DIAGNOSIS — M79643 Pain in unspecified hand: Secondary | ICD-10-CM | POA: Diagnosis not present

## 2017-06-20 DIAGNOSIS — M47817 Spondylosis without myelopathy or radiculopathy, lumbosacral region: Secondary | ICD-10-CM | POA: Diagnosis not present

## 2017-06-20 DIAGNOSIS — M5137 Other intervertebral disc degeneration, lumbosacral region: Secondary | ICD-10-CM | POA: Diagnosis not present

## 2017-06-20 DIAGNOSIS — G894 Chronic pain syndrome: Secondary | ICD-10-CM | POA: Diagnosis not present

## 2017-06-22 IMAGING — CR DG CHEST 2V
2 series · 2 of 2 positions shown · non-contrast
Comparison: 08/17/2016.

CLINICAL DATA: Preoperative study.

EXAM:
CHEST  2 VIEW

[w chest pa]
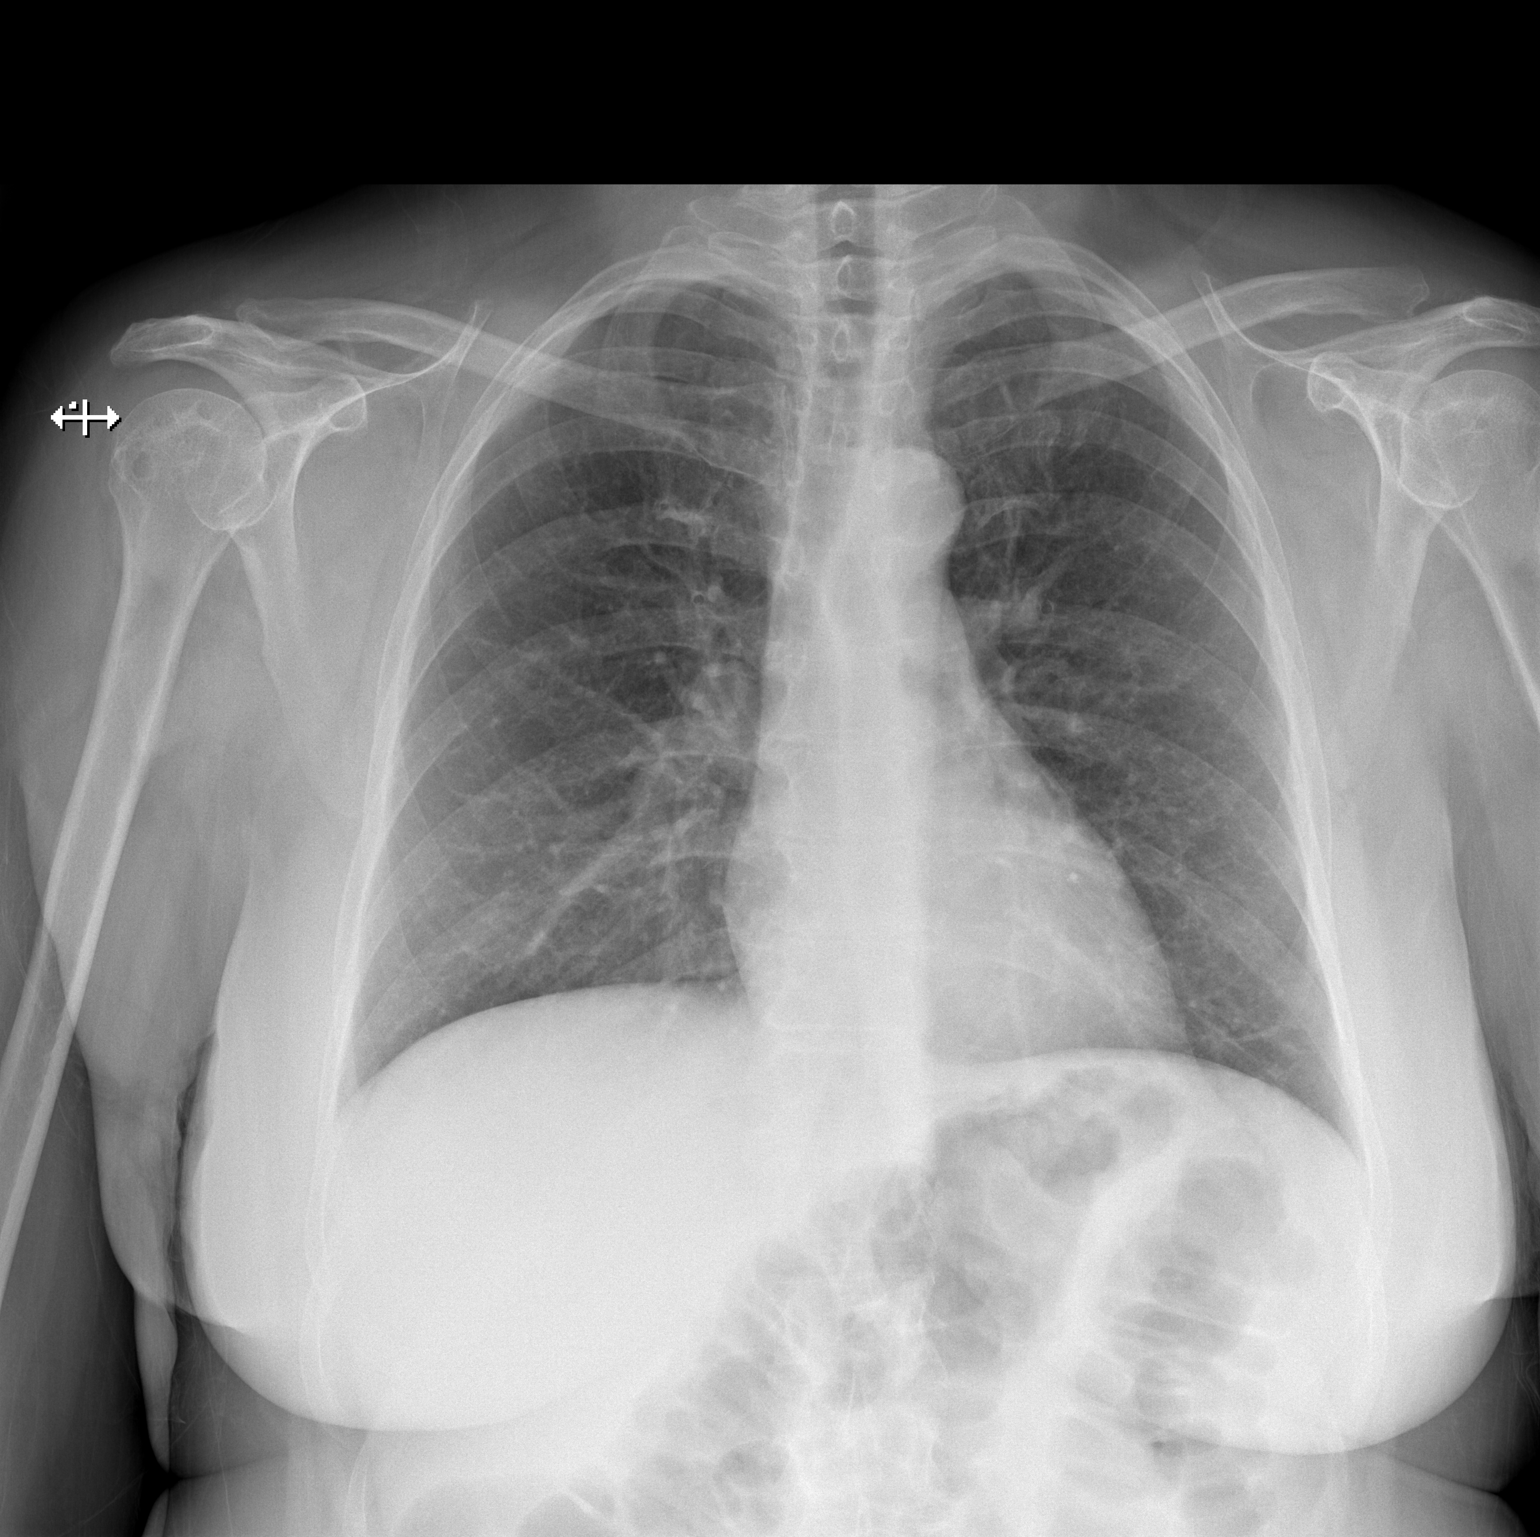

[w chest lat]
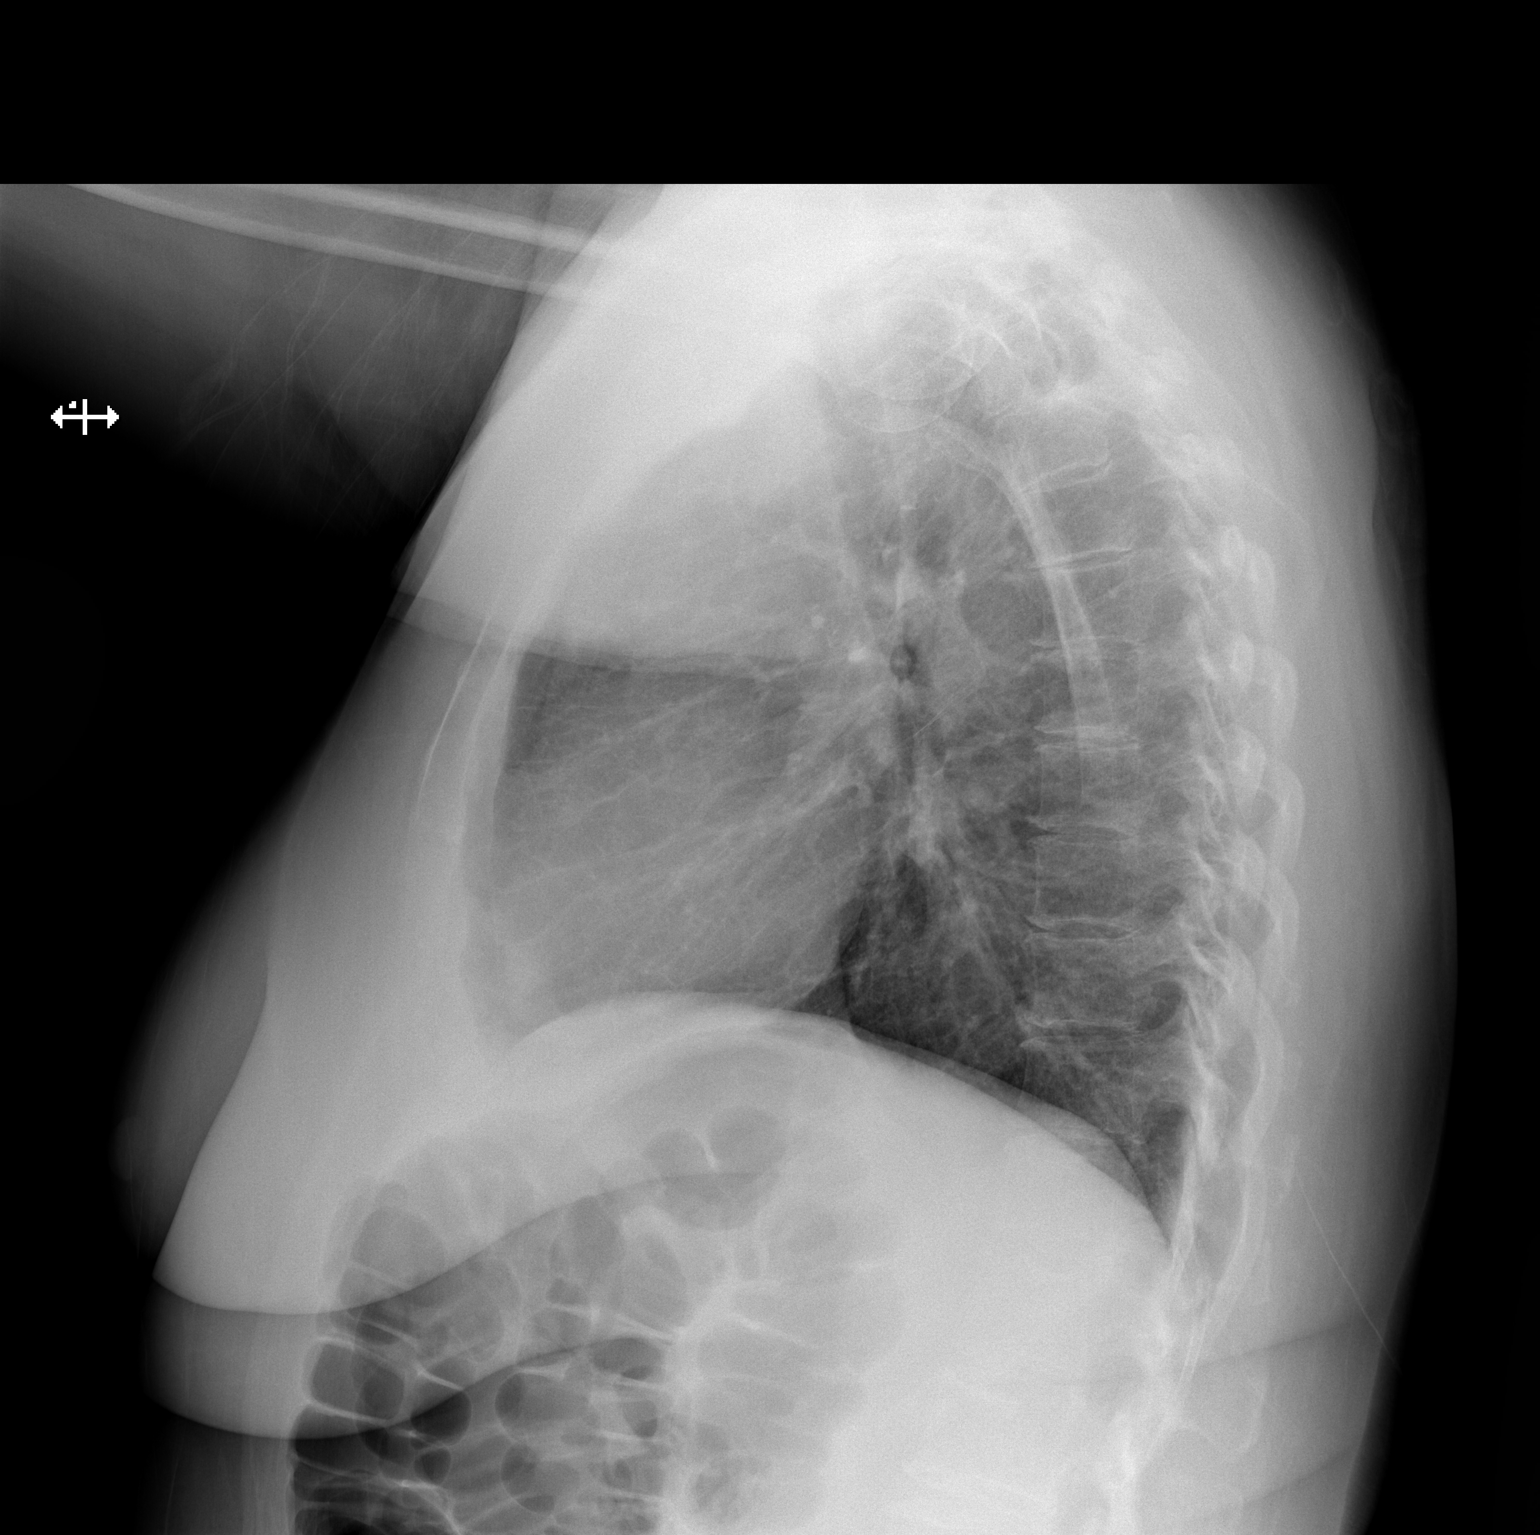

[2 of 2 positions shown; findings below may reference images not displayed]

FINDINGS: Mediastinum hilar structures normal. Lungs are clear. Heart size
normal. No pleural effusion or pneumothorax. No acute bony
abnormality. Chest stable from prior exam.
IMPRESSION: Stable chest.  No acute cardiopulmonary disease .

## 2017-06-30 DIAGNOSIS — Z1159 Encounter for screening for other viral diseases: Secondary | ICD-10-CM | POA: Diagnosis not present

## 2017-06-30 DIAGNOSIS — I10 Essential (primary) hypertension: Secondary | ICD-10-CM | POA: Diagnosis not present

## 2017-06-30 DIAGNOSIS — R7301 Impaired fasting glucose: Secondary | ICD-10-CM | POA: Diagnosis not present

## 2017-06-30 DIAGNOSIS — E559 Vitamin D deficiency, unspecified: Secondary | ICD-10-CM | POA: Diagnosis not present

## 2017-07-03 DIAGNOSIS — R7301 Impaired fasting glucose: Secondary | ICD-10-CM | POA: Diagnosis not present

## 2017-07-03 DIAGNOSIS — G8929 Other chronic pain: Secondary | ICD-10-CM | POA: Diagnosis not present

## 2017-07-03 DIAGNOSIS — E782 Mixed hyperlipidemia: Secondary | ICD-10-CM | POA: Diagnosis not present

## 2017-07-03 DIAGNOSIS — I10 Essential (primary) hypertension: Secondary | ICD-10-CM | POA: Diagnosis not present

## 2017-07-18 DIAGNOSIS — G894 Chronic pain syndrome: Secondary | ICD-10-CM | POA: Diagnosis not present

## 2017-07-18 DIAGNOSIS — E782 Mixed hyperlipidemia: Secondary | ICD-10-CM | POA: Diagnosis not present

## 2017-07-18 DIAGNOSIS — I10 Essential (primary) hypertension: Secondary | ICD-10-CM | POA: Diagnosis not present

## 2017-07-18 DIAGNOSIS — R7301 Impaired fasting glucose: Secondary | ICD-10-CM | POA: Diagnosis not present

## 2017-07-21 DIAGNOSIS — Z79891 Long term (current) use of opiate analgesic: Secondary | ICD-10-CM | POA: Diagnosis not present

## 2017-07-21 DIAGNOSIS — M79643 Pain in unspecified hand: Secondary | ICD-10-CM | POA: Diagnosis not present

## 2017-07-21 DIAGNOSIS — M47817 Spondylosis without myelopathy or radiculopathy, lumbosacral region: Secondary | ICD-10-CM | POA: Diagnosis not present

## 2017-07-21 DIAGNOSIS — M5137 Other intervertebral disc degeneration, lumbosacral region: Secondary | ICD-10-CM | POA: Diagnosis not present

## 2017-07-21 DIAGNOSIS — Z79899 Other long term (current) drug therapy: Secondary | ICD-10-CM | POA: Diagnosis not present

## 2017-07-21 DIAGNOSIS — G894 Chronic pain syndrome: Secondary | ICD-10-CM | POA: Diagnosis not present

## 2017-08-15 DIAGNOSIS — M5137 Other intervertebral disc degeneration, lumbosacral region: Secondary | ICD-10-CM | POA: Diagnosis not present

## 2017-08-15 DIAGNOSIS — M79643 Pain in unspecified hand: Secondary | ICD-10-CM | POA: Diagnosis not present

## 2017-08-15 DIAGNOSIS — Z79891 Long term (current) use of opiate analgesic: Secondary | ICD-10-CM | POA: Diagnosis not present

## 2017-08-15 DIAGNOSIS — M47817 Spondylosis without myelopathy or radiculopathy, lumbosacral region: Secondary | ICD-10-CM | POA: Diagnosis not present

## 2017-08-15 DIAGNOSIS — Z79899 Other long term (current) drug therapy: Secondary | ICD-10-CM | POA: Diagnosis not present

## 2017-08-15 DIAGNOSIS — G894 Chronic pain syndrome: Secondary | ICD-10-CM | POA: Diagnosis not present

## 2017-09-11 DIAGNOSIS — R42 Dizziness and giddiness: Secondary | ICD-10-CM | POA: Diagnosis not present

## 2017-09-11 DIAGNOSIS — Z6830 Body mass index (BMI) 30.0-30.9, adult: Secondary | ICD-10-CM | POA: Diagnosis not present

## 2017-09-12 DIAGNOSIS — M47817 Spondylosis without myelopathy or radiculopathy, lumbosacral region: Secondary | ICD-10-CM | POA: Diagnosis not present

## 2017-09-12 DIAGNOSIS — G894 Chronic pain syndrome: Secondary | ICD-10-CM | POA: Diagnosis not present

## 2017-09-12 DIAGNOSIS — M5137 Other intervertebral disc degeneration, lumbosacral region: Secondary | ICD-10-CM | POA: Diagnosis not present

## 2017-09-12 DIAGNOSIS — M79643 Pain in unspecified hand: Secondary | ICD-10-CM | POA: Diagnosis not present

## 2017-09-13 DIAGNOSIS — G894 Chronic pain syndrome: Secondary | ICD-10-CM | POA: Diagnosis not present

## 2017-09-13 DIAGNOSIS — R42 Dizziness and giddiness: Secondary | ICD-10-CM | POA: Diagnosis not present

## 2017-09-13 DIAGNOSIS — Z6830 Body mass index (BMI) 30.0-30.9, adult: Secondary | ICD-10-CM | POA: Diagnosis not present

## 2017-09-13 DIAGNOSIS — I10 Essential (primary) hypertension: Secondary | ICD-10-CM | POA: Diagnosis not present

## 2017-09-27 DIAGNOSIS — Z008 Encounter for other general examination: Secondary | ICD-10-CM | POA: Diagnosis not present

## 2017-10-05 DIAGNOSIS — Z79899 Other long term (current) drug therapy: Secondary | ICD-10-CM | POA: Diagnosis not present

## 2017-10-05 DIAGNOSIS — M79643 Pain in unspecified hand: Secondary | ICD-10-CM | POA: Diagnosis not present

## 2017-10-05 DIAGNOSIS — M47817 Spondylosis without myelopathy or radiculopathy, lumbosacral region: Secondary | ICD-10-CM | POA: Diagnosis not present

## 2017-10-05 DIAGNOSIS — G894 Chronic pain syndrome: Secondary | ICD-10-CM | POA: Diagnosis not present

## 2017-10-05 DIAGNOSIS — M5137 Other intervertebral disc degeneration, lumbosacral region: Secondary | ICD-10-CM | POA: Diagnosis not present

## 2017-10-05 DIAGNOSIS — Z79891 Long term (current) use of opiate analgesic: Secondary | ICD-10-CM | POA: Diagnosis not present

## 2017-10-18 DIAGNOSIS — G894 Chronic pain syndrome: Secondary | ICD-10-CM | POA: Diagnosis not present

## 2017-10-27 DIAGNOSIS — M25511 Pain in right shoulder: Secondary | ICD-10-CM | POA: Diagnosis not present

## 2017-11-08 DIAGNOSIS — M67911 Unspecified disorder of synovium and tendon, right shoulder: Secondary | ICD-10-CM | POA: Diagnosis not present

## 2017-11-15 DIAGNOSIS — M25511 Pain in right shoulder: Secondary | ICD-10-CM | POA: Diagnosis not present

## 2017-11-16 DIAGNOSIS — M79606 Pain in leg, unspecified: Secondary | ICD-10-CM | POA: Diagnosis not present

## 2017-11-16 DIAGNOSIS — Z79899 Other long term (current) drug therapy: Secondary | ICD-10-CM | POA: Diagnosis not present

## 2017-11-16 DIAGNOSIS — G894 Chronic pain syndrome: Secondary | ICD-10-CM | POA: Diagnosis not present

## 2017-11-16 DIAGNOSIS — M5137 Other intervertebral disc degeneration, lumbosacral region: Secondary | ICD-10-CM | POA: Diagnosis not present

## 2017-11-16 DIAGNOSIS — M47817 Spondylosis without myelopathy or radiculopathy, lumbosacral region: Secondary | ICD-10-CM | POA: Diagnosis not present

## 2017-11-16 DIAGNOSIS — Z79891 Long term (current) use of opiate analgesic: Secondary | ICD-10-CM | POA: Diagnosis not present

## 2017-11-30 ENCOUNTER — Ambulatory Visit (HOSPITAL_COMMUNITY): Payer: BLUE CROSS/BLUE SHIELD | Attending: Orthopedic Surgery | Admitting: Specialist

## 2017-11-30 ENCOUNTER — Encounter (HOSPITAL_COMMUNITY): Payer: Self-pay | Admitting: Specialist

## 2017-11-30 DIAGNOSIS — M25611 Stiffness of right shoulder, not elsewhere classified: Secondary | ICD-10-CM | POA: Diagnosis not present

## 2017-11-30 DIAGNOSIS — M25511 Pain in right shoulder: Secondary | ICD-10-CM | POA: Insufficient documentation

## 2017-11-30 DIAGNOSIS — R29898 Other symptoms and signs involving the musculoskeletal system: Secondary | ICD-10-CM | POA: Insufficient documentation

## 2017-11-30 NOTE — Patient Instructions (Signed)
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
## 2017-12-08 ENCOUNTER — Encounter (HOSPITAL_COMMUNITY): Payer: Self-pay | Admitting: Occupational Therapy

## 2017-12-08 ENCOUNTER — Ambulatory Visit (HOSPITAL_COMMUNITY): Payer: BLUE CROSS/BLUE SHIELD | Attending: Orthopedic Surgery | Admitting: Occupational Therapy

## 2017-12-08 ENCOUNTER — Other Ambulatory Visit: Payer: Self-pay

## 2017-12-08 DIAGNOSIS — R29898 Other symptoms and signs involving the musculoskeletal system: Secondary | ICD-10-CM | POA: Diagnosis not present

## 2017-12-08 DIAGNOSIS — M25511 Pain in right shoulder: Secondary | ICD-10-CM | POA: Diagnosis not present

## 2017-12-08 DIAGNOSIS — M25611 Stiffness of right shoulder, not elsewhere classified: Secondary | ICD-10-CM | POA: Diagnosis not present

## 2017-12-08 DIAGNOSIS — M67911 Unspecified disorder of synovium and tendon, right shoulder: Secondary | ICD-10-CM | POA: Diagnosis not present

## 2017-12-08 NOTE — Therapy (Signed)
[facility] [address] [place], [place], [address] Phone: [phone]   Fax:  [phone]  Occupational Therapy Treatment  Patient Details  Name: Melanie Avila MRN: [id number] Date of Birth: [birth year] No Data Recorded  Encounter Date: 12/08/2017  OT End of Session - 12/08/17 1347    Visit Number  2    Number of Visits  12    Date for OT Re-Evaluation  01/11/18 mini reassess on 12/21/17    Authorization Type  [employer]     Authorization Time Period  90 visits through 12/04/17, 3 used    Authorization - Visit Number  5    Authorization - Number of Visits  90    OT Start Time  1301    OT Stop Time  1344    OT Time Calculation (min)  43 min    Activity Tolerance  Patient tolerated treatment well    Behavior During Therapy  [facility] for tasks assessed/performed       Past Medical History:  Diagnosis Date  . Anemia   . Anxiety   . Complication of anesthesia    last shoulder surg 2010-dsc-2 hr after block to surg-neck swelled-hard to tube-stayed RCC  . DDD (degenerative disc disease)   . Depression   . Fibromyalgia   . GERD (gastroesophageal reflux disease)   . Headache    migraines in her younger years  . Heart murmur    since birth and never has been a problem  . History of kidney stones    7 times  . Hyperlipidemia   . Hypertension   . Neuromuscular disorder (HCC)    nerve damage left leg  . PONV (postoperative nausea and vomiting)    shorter surgeries tends to have n/v.     Past Surgical History:  Procedure Laterality Date  . BACK SURGERY  89,02   lumbar x2  . CARPAL TUNNEL RELEASE Bilateral   . COLONOSCOPY N/A 12/09/2015   Procedure: COLONOSCOPY;  Surgeon: Malissa Hippo, MD;  [facility];  Service: Endoscopy;  Laterality: N/A;  830  . SHOULDER ARTHROSCOPY WITH SUBACROMIAL DECOMPRESSION  10/23/2012   Procedure: SHOULDER ARTHROSCOPY WITH SUBACROMIAL DECOMPRESSION;  Surgeon: Wyn Forster., MD;  Location: [facility];  Service: Orthopedics;  Laterality: Left;  LEFT SHOULDER  ARTHROSCOPY WITH SUBACROMIAL DECOMPRESSION, DISTAL CLAVICLE RESECTION, ARTHROSCOPIC SUBSCAPULARIS REPAIR AND OPEN REPAIR OF SUPRASPINATOUS TENDON  . SHOULDER SURGERY Bilateral   . TUBAL LIGATION      There were no vitals filed for this visit.  Subjective Assessment - 12/08/17 1259    Subjective   S: The doctor is going to go ahead and order an MRI.     Currently in Pain?  Yes    Pain Score  7     Pain Location  Shoulder    Pain Orientation  Right    Pain Descriptors / Indicators  Aching;Sore    Pain Type  Acute pain    Pain Radiating Towards  elbow    Pain Onset  More than a month ago    Pain Frequency  Constant    Aggravating Factors   use, movement    Pain Relieving Factors  rest and medication    Effect of Pain on Daily Activities  moderate effect of ADLs         [facility] OT Assessment - 12/08/17 1258      Assessment   Medical Diagnosis  Right Shoulder Pain  Precautions   Precautions  None               OT Treatments/Exercises (OP) - 12/08/17 1304      Exercises   Exercises  Shoulder      Shoulder Exercises: Supine   Protraction  PROM;5 reps;AAROM;10 reps    Horizontal ABduction  PROM;5 reps;AAROM;10 reps    External Rotation  PROM;5 reps;AAROM;10 reps    Internal Rotation  PROM;5 reps;AAROM;10 reps    Flexion  PROM;5 reps;AAROM;10 reps    ABduction  PROM;5 reps;AAROM;10 reps      Shoulder Exercises: Seated   Elevation  AROM;10 reps    Extension  AROM;10 reps    Row  AROM;10 reps      Manual Therapy   Manual Therapy  Myofascial release    Manual therapy comments  manual therapy intervention completed seperately from all other interventions this date of service.      Myofascial Release  Myofasical release and manual stretching to right upper arm, scapular, and shoulder region to decrease pain and restrictions and improve pain free mobiilty in right shoulder.               OT Education - 12/08/17 1346    Education provided  Yes    Education Details  provided evaluation and reviewed with pt    Person(s) Educated  Patient    Methods  Explanation;Handout    Comprehension  Verbalized understanding       OT Short Term Goals - 12/08/17 1339      OT SHORT TERM GOAL #1   Title  Patient will be educated on a HEP for improved right shoulder mobility and strength.     Time  3    Period  Weeks    Status  On-going      OT SHORT TERM GOAL #2   Title  Patient will decrease pain to 4/10 in her right shoulder region.      Time  3    Period  Weeks    Status  On-going      OT SHORT TERM GOAL #3   Title  Patient will decrease fascial restrictions to moderate in right shoulder region for greater pain free mobility.    Time  3    Period  Weeks    Status  On-going        OT Long Term Goals - 12/08/17 1339      OT LONG TERM GOAL #1   Title  Patient will return to prior level of independence with all B/IADLSs, work, and leisure activities using right arm as dominant.     Time  6    Period  Weeks    Status  On-going      OT LONG TERM GOAL #2   Title  Patient will improve right shoulder A/ROM to WNL for improved ability to complete overhead reaching tasks at work.     Time  6    Period  Weeks    Status  On-going      OT LONG TERM GOAL #3   Title  Patient will improve right shoulder strength to 5/5 for improved ability to lift packages overhead at work.    Time  6    Period  Weeks    Status  On-going      OT LONG TERM GOAL #4   Title  Patient will decrease pain in her right shoulder to 2/10 for improved ability to complete acitivites.  Time  6    Period  Weeks    Status  On-going      OT LONG TERM GOAL #5   Title  Patient will decrease fasical restrictions to min-mod in her right shoulder for greater mobility needed for daily and work tasks.    Time  6    Period  Weeks    Status  On-going            Plan - 12/08/17 1347     Clinical Impression Statement  A: Initiated myofascial release, P/ROM, AA/ROM, and scapular A/ROM this session. Pt able to achieve ROM WNL, occasional rest breaks for fatigue. Pt occasionally experiencing muscle cramping in scapular region with end range stretch during AA/ROM. Pt reports HEP is going well.     Plan  P: Continue with AA/ROM progressing to A/ROM as able to tolerate. Add AA/ROM in sitting next session       Patient will benefit from skilled therapeutic intervention in order to improve the following deficits and impairments:  Decreased strength, Decreased range of motion, Pain, Impaired UE functional use  Visit Diagnosis: Acute pain of right shoulder  Other symptoms and signs involving the musculoskeletal system  Stiffness of right shoulder, not elsewhere classified    Problem List Patient Active Problem List   Diagnosis Date Noted  . Radiculopathy 11/02/2016  . Left leg pain 11/22/2014  . Fibromyalgia 01/30/2012  . Depression with anxiety 01/30/2012  . Lumbar post-laminectomy syndrome 01/30/2012  . Thoracic or lumbosacral neuritis or radiculitis, unspecified 01/30/2012   Ezra Sites, OTR/L  [phone] 12/08/2017, 1:48 PM  [facility] [address] [place], [place], [address] Phone: [phone]   Fax:  [phone]  Name: JALIA ZUNIGA MRN: [id number] Date of Birth: 1961/07/04

## 2017-12-11 ENCOUNTER — Ambulatory Visit (HOSPITAL_COMMUNITY): Payer: BLUE CROSS/BLUE SHIELD | Admitting: Specialist

## 2017-12-11 ENCOUNTER — Encounter (HOSPITAL_COMMUNITY): Payer: Self-pay | Admitting: Specialist

## 2017-12-11 DIAGNOSIS — R29898 Other symptoms and signs involving the musculoskeletal system: Secondary | ICD-10-CM

## 2017-12-11 DIAGNOSIS — M25611 Stiffness of right shoulder, not elsewhere classified: Secondary | ICD-10-CM | POA: Diagnosis not present

## 2017-12-11 DIAGNOSIS — M25511 Pain in right shoulder: Secondary | ICD-10-CM | POA: Diagnosis not present

## 2017-12-11 NOTE — Therapy (Addendum)
[place], [place], [address] Phone: [phone]   Fax:  [phone]  Occupational Therapy Treatment & Discharge  Patient Details  Name: Melanie Avila MRN: [id number] Date of Birth: 1961/01/17 No Data Recorded  Encounter Date: 12/11/2017  OT End of Session - 12/11/17 1443    Visit Number  3    Number of Visits  12    Date for OT Re-Evaluation  01/11/18 mini reassess on 12/21/17    Authorization Type  BC[employer]     OT Start Time  1350    OT Stop Time  1430    OT Time Calculation (min)  40 min    Activity Tolerance  Patient tolerated treatment well    Behavior During Therapy  [facility] for tasks assessed/performed       Past Medical History:  Diagnosis Date  . Anemia   . Anxiety   . Complication of anesthesia    last shoulder surg 2010-dsc-2 hr after block to surg-neck swelled-hard to tube-stayed RCC  . DDD (degenerative disc disease)   . Depression   . Fibromyalgia   . GERD (gastroesophageal reflux disease)   . Headache    migraines in her younger years  . Heart murmur    since birth and never has been a problem  . History of kidney stones    7 times  . Hyperlipidemia   . Hypertension   . Neuromuscular disorder ([place])    nerve damage left leg  . PONV (postoperative nausea and vomiting)    shorter surgeries tends to have n/v.     Past Surgical History:  Procedure Laterality Date  . BACK SURGERY  89,02   lumbar x2  . CARPAL TUNNEL RELEASE Bilateral   . COLONOSCOPY N/A 12/09/2015   Procedure: COLONOSCOPY;  Surgeon: Rogene Houston, MD;  [facility];  Service: Endoscopy;  Laterality: N/A;  830  . SHOULDER ARTHROSCOPY WITH SUBACROMIAL DECOMPRESSION  10/23/2012   Procedure: SHOULDER ARTHROSCOPY WITH SUBACROMIAL DECOMPRESSION;  Surgeon: Cammie Sickle., MD;  Location: [place];  Service: Orthopedics;  Laterality: Left;  LEFT SHOULDER  ARTHROSCOPY WITH SUBACROMIAL DECOMPRESSION, DISTAL  CLAVICLE RESECTION, ARTHROSCOPIC SUBSCAPULARIS REPAIR AND OPEN REPAIR OF SUPRASPINATOUS TENDON  . SHOULDER SURGERY Bilateral   . TUBAL LIGATION      There were no vitals filed for this visit.  Subjective Assessment - 12/11/17 1443    Subjective   S:  With the fibromyalgia, I was so sore after my last session.      Currently in Pain?  Yes    Pain Score  6     Pain Location  Shoulder    Pain Orientation  Right    Pain Descriptors / Indicators  Aching;Sore    Pain Type  Acute pain         [facility] OT Assessment - 12/11/17 0001      Assessment   Medical Diagnosis  Right Shoulder Pain               OT Treatments/Exercises (OP) - 12/11/17 0001      Exercises   Exercises  Shoulder      Shoulder Exercises: Supine   Protraction  PROM;5 reps;AAROM;10 reps    Horizontal ABduction  PROM;5 reps;AAROM;10 reps    External Rotation  PROM;5 reps;AAROM;10 reps    Internal Rotation  PROM;5 reps;AAROM;10 reps    Flexion  PROM;5 reps;AAROM;10 reps    ABduction  PROM;5  reps;AAROM;10 reps      Shoulder Exercises: Standing   Protraction  AROM;10 reps    Horizontal ABduction  AAROM;10 reps    External Rotation  AAROM;10 reps    Internal Rotation  AAROM;10 reps    Flexion  AAROM;10 reps    ABduction  AAROM;10 reps    Extension  Theraband;10 reps    Theraband Level (Shoulder Extension)  Level 2 (Red)    Row  Theraband;10 reps    Theraband Level (Shoulder Row)  Level 2 (Red)    Retraction  Theraband;10 reps    Theraband Level (Shoulder Retraction)  Level 2 (Red)      Shoulder Exercises: ROM/Strengthening   UBE (Upper Arm Bike)  2 minutes at level 1.0 in reverse      Manual Therapy   Manual Therapy  Myofascial release    Manual therapy comments  manual therapy intervention completed seperately from all other interventions this date of service.      Myofascial Release  Myofasical release and manual stretching to right upper arm, scapular, and shoulder region to decrease pain and  restrictions and improve pain free mobiilty in right shoulder.                OT Short Term Goals - 12/08/17 1339      OT SHORT TERM GOAL #1   Title  Patient will be educated on a HEP for improved right shoulder mobility and strength.     Time  3    Period  Weeks    Status  On-going      OT SHORT TERM GOAL #2   Title  Patient will decrease pain to 4/10 in her right shoulder region.      Time  3    Period  Weeks    Status  On-going      OT SHORT TERM GOAL #3   Title  Patient will decrease fascial restrictions to moderate in right shoulder region for greater pain free mobility.    Time  3    Period  Weeks    Status  On-going        OT Long Term Goals - 12/08/17 1339      OT LONG TERM GOAL #1   Title  Patient will return to prior level of independence with all B/IADLSs, work, and leisure activities using right arm as dominant.     Time  6    Period  Weeks    Status  On-going      OT LONG TERM GOAL #2   Title  Patient will improve right shoulder A/ROM to WNL for improved ability to complete overhead reaching tasks at work.     Time  6    Period  Weeks    Status  On-going      OT LONG TERM GOAL #3   Title  Patient will improve right shoulder strength to 5/5 for improved ability to lift packages overhead at work.    Time  6    Period  Weeks    Status  On-going      OT LONG TERM GOAL #4   Title  Patient will decrease pain in her right shoulder to 2/10 for improved ability to complete acitivites.    Time  6    Period  Weeks    Status  On-going      OT LONG TERM GOAL #5   Title  Patient will decrease fasical restrictions to min-mod in her right  shoulder for greater mobility needed for daily and work tasks.    Time  6    Period  Weeks    Status  On-going            Plan - 12/11/17 1446    Clinical Impression Statement  A: Patient able to move right arm through full AA/ROM however, she experiences pain with all movement.  treatment focused this date on  improving scapular strength and alignment in order to decrease pain in her right shoulder region.      Plan  P:  Attempt IFES for pain control, attempt A/ROM in supine.         Patient will benefit from skilled therapeutic intervention in order to improve the following deficits and impairments:     Visit Diagnosis: No diagnosis found.    Problem List Patient Active Problem List   Diagnosis Date Noted  . Radiculopathy 11/02/2016  . Left leg pain 11/22/2014  . Fibromyalgia 01/30/2012  . Depression with anxiety 01/30/2012  . Lumbar post-laminectomy syndrome 01/30/2012  . Thoracic or lumbosacral neuritis or radiculitis, unspecified 01/30/2012    Vangie Bicker, [place], OTR/L [id number]  12/11/2017, 2:51 PM  [facility] [address] [place], [place], [address] Phone: [phone]   Fax:  [phone]  Name: Melanie Avila MRN: [id number] Date of Birth: [birth year]   OCCUPATIONAL THERAPY DISCHARGE SUMMARY  Visits from Start of Care: 3  Current functional level related to goals / functional outcomes: Unknown. Pt requesting to d/c as she is having surgery.    Remaining deficits: unknown   Education / Equipment: HEP Plan: Patient agrees to discharge.  Patient goals were not met. Patient is being discharged due to a change in medical status.  ?????

## 2017-12-13 ENCOUNTER — Ambulatory Visit (HOSPITAL_COMMUNITY): Payer: BLUE CROSS/BLUE SHIELD

## 2017-12-13 ENCOUNTER — Telehealth (HOSPITAL_COMMUNITY): Payer: Self-pay | Admitting: Internal Medicine

## 2017-12-13 NOTE — Telephone Encounter (Signed)
12/13/17  pt left a message to cx said she just wasn't feeling good today

## 2017-12-14 DIAGNOSIS — M47817 Spondylosis without myelopathy or radiculopathy, lumbosacral region: Secondary | ICD-10-CM | POA: Diagnosis not present

## 2017-12-14 DIAGNOSIS — M79643 Pain in unspecified hand: Secondary | ICD-10-CM | POA: Diagnosis not present

## 2017-12-14 DIAGNOSIS — Z79891 Long term (current) use of opiate analgesic: Secondary | ICD-10-CM | POA: Diagnosis not present

## 2017-12-14 DIAGNOSIS — G894 Chronic pain syndrome: Secondary | ICD-10-CM | POA: Diagnosis not present

## 2017-12-14 DIAGNOSIS — Z79899 Other long term (current) drug therapy: Secondary | ICD-10-CM | POA: Diagnosis not present

## 2017-12-14 DIAGNOSIS — M5137 Other intervertebral disc degeneration, lumbosacral region: Secondary | ICD-10-CM | POA: Diagnosis not present

## 2017-12-15 DIAGNOSIS — M25511 Pain in right shoulder: Secondary | ICD-10-CM | POA: Diagnosis not present

## 2017-12-18 ENCOUNTER — Ambulatory Visit (HOSPITAL_COMMUNITY): Payer: BLUE CROSS/BLUE SHIELD | Admitting: Specialist

## 2017-12-18 ENCOUNTER — Telehealth (HOSPITAL_COMMUNITY): Payer: Self-pay | Admitting: Specialist

## 2017-12-18 NOTE — Telephone Encounter (Signed)
Her power is out and she can not get here. °

## 2017-12-18 NOTE — Telephone Encounter (Signed)
Her power is out and she can not get here.

## 2017-12-19 ENCOUNTER — Telehealth (HOSPITAL_COMMUNITY): Payer: Self-pay | Admitting: Internal Medicine

## 2017-12-19 NOTE — Telephone Encounter (Signed)
12/19/17  Pt cx said that she was having a MRI and needs to see the results of that before continuing therapy

## 2017-12-20 ENCOUNTER — Ambulatory Visit (HOSPITAL_COMMUNITY): Payer: BLUE CROSS/BLUE SHIELD | Admitting: Occupational Therapy

## 2017-12-20 DIAGNOSIS — M75121 Complete rotator cuff tear or rupture of right shoulder, not specified as traumatic: Secondary | ICD-10-CM | POA: Diagnosis not present

## 2017-12-20 DIAGNOSIS — F17210 Nicotine dependence, cigarettes, uncomplicated: Secondary | ICD-10-CM | POA: Diagnosis not present

## 2017-12-20 DIAGNOSIS — Z716 Tobacco abuse counseling: Secondary | ICD-10-CM | POA: Diagnosis not present

## 2017-12-22 ENCOUNTER — Telehealth (HOSPITAL_COMMUNITY): Payer: Self-pay | Admitting: Internal Medicine

## 2017-12-22 NOTE — Telephone Encounter (Signed)
12/22/17  pt left a message that she is going to be having surgery on 1/24 and to cx her appts.  She will call us back if/when she needs to reschedule

## 2017-12-25 ENCOUNTER — Ambulatory Visit (HOSPITAL_COMMUNITY): Payer: BLUE CROSS/BLUE SHIELD

## 2017-12-27 ENCOUNTER — Encounter (HOSPITAL_COMMUNITY): Payer: BLUE CROSS/BLUE SHIELD | Admitting: Occupational Therapy

## 2017-12-28 DIAGNOSIS — M75121 Complete rotator cuff tear or rupture of right shoulder, not specified as traumatic: Secondary | ICD-10-CM | POA: Diagnosis not present

## 2017-12-28 DIAGNOSIS — G8918 Other acute postprocedural pain: Secondary | ICD-10-CM | POA: Diagnosis not present

## 2017-12-28 DIAGNOSIS — M7541 Impingement syndrome of right shoulder: Secondary | ICD-10-CM | POA: Diagnosis not present

## 2017-12-28 DIAGNOSIS — S46011A Strain of muscle(s) and tendon(s) of the rotator cuff of right shoulder, initial encounter: Secondary | ICD-10-CM | POA: Diagnosis not present

## 2018-01-05 DIAGNOSIS — Z9889 Other specified postprocedural states: Secondary | ICD-10-CM | POA: Diagnosis not present

## 2018-01-11 DIAGNOSIS — G894 Chronic pain syndrome: Secondary | ICD-10-CM | POA: Diagnosis not present

## 2018-01-11 DIAGNOSIS — M5137 Other intervertebral disc degeneration, lumbosacral region: Secondary | ICD-10-CM | POA: Diagnosis not present

## 2018-01-11 DIAGNOSIS — M47817 Spondylosis without myelopathy or radiculopathy, lumbosacral region: Secondary | ICD-10-CM | POA: Diagnosis not present

## 2018-01-11 DIAGNOSIS — M79643 Pain in unspecified hand: Secondary | ICD-10-CM | POA: Diagnosis not present

## 2018-01-17 ENCOUNTER — Ambulatory Visit (HOSPITAL_COMMUNITY): Payer: BLUE CROSS/BLUE SHIELD | Attending: Orthopedic Surgery | Admitting: Occupational Therapy

## 2018-01-17 ENCOUNTER — Encounter (HOSPITAL_COMMUNITY): Payer: Self-pay | Admitting: Occupational Therapy

## 2018-01-17 ENCOUNTER — Other Ambulatory Visit: Payer: Self-pay

## 2018-01-17 DIAGNOSIS — M25611 Stiffness of right shoulder, not elsewhere classified: Secondary | ICD-10-CM | POA: Diagnosis not present

## 2018-01-17 DIAGNOSIS — M25511 Pain in right shoulder: Secondary | ICD-10-CM | POA: Insufficient documentation

## 2018-01-17 DIAGNOSIS — R29898 Other symptoms and signs involving the musculoskeletal system: Secondary | ICD-10-CM | POA: Insufficient documentation

## 2018-01-17 NOTE — Therapy (Signed)
**Note De-Identified Melanie Obfuscation** [facility] [address] [place], [place], [address] Phone: [phone]   Fax:  [phone]  Occupational Therapy Evaluation  Patient Details  Name: Melanie Avila MRN: [id number] Date of Birth: [birth year] Referring Provider: Dr. Jones Broom   Encounter Date: 01/17/2018  OT End of Session - 01/17/18 1603    Visit Number  1    Number of Visits  16    Date for OT Re-Evaluation  03/18/18 mini-reassessment 02/15/2018    Authorization Type  BCBS    Authorization Time Period  90 visits, 2 used    Authorization - Visit Number  3    Authorization - Number of Visits  90    OT Start Time  1520    OT Stop Time  1554    OT Time Calculation (min)  34 min    Activity Tolerance  Patient tolerated treatment well    Behavior During Therapy  WFL for tasks assessed/performed       Past Medical History:  Diagnosis Date  . Anemia   . Anxiety   . Complication of anesthesia    last shoulder surg 2010-dsc-2 hr after block to surg-neck swelled-hard to tube-stayed RCC  . DDD (degenerative disc disease)   . Depression   . Fibromyalgia   . GERD (gastroesophageal reflux disease)   . Headache    migraines in her younger years  . Heart murmur    since birth and never has been a problem  . History of kidney stones    7 times  . Hyperlipidemia   . Hypertension   . Neuromuscular disorder ([facility])    nerve damage left leg  . PONV (postoperative nausea and vomiting)    shorter surgeries tends to have n/v.     Past Surgical History:  Procedure Laterality Date  . BACK SURGERY  89,02   lumbar x2  . CARPAL TUNNEL RELEASE Bilateral   . COLONOSCOPY N/A 12/09/2015   Procedure: COLONOSCOPY;  Surgeon: Malissa Hippo, MD;  [facility];  Service: Endoscopy;  Laterality: N/A;  830  . SHOULDER ARTHROSCOPY WITH SUBACROMIAL DECOMPRESSION  10/23/2012   Procedure: SHOULDER ARTHROSCOPY WITH SUBACROMIAL DECOMPRESSION;  Surgeon: Wyn Forster., MD;   Location: [facility];  Service: Orthopedics;  Laterality: Left;  LEFT SHOULDER  ARTHROSCOPY WITH SUBACROMIAL DECOMPRESSION, DISTAL CLAVICLE RESECTION, ARTHROSCOPIC SUBSCAPULARIS REPAIR AND OPEN REPAIR OF SUPRASPINATOUS TENDON  . SHOULDER SURGERY Bilateral   . TUBAL LIGATION      There were no vitals filed for this visit.  Subjective Assessment - 01/17/18 1556    Subjective   S: I've been through this twice before so I'm an old hand at this.     Pertinent History  Pt is a 57 y/o female s/p right arthroscopic RCR on 12/28/17 after injuring her shoulder in October 2018. Pt was seen at this clinic for 3 visits before MD determined sx was warranted. Pt presents in a sling and reports she is using ice and heat as well as pain medications for pain management. Dr. Jones Broom referred pt to occupational therapy for evaluation and treatment.     Special Tests  FOTO Score: 26/100 (74% impairment)    Patient Stated Goals  To be able to use right arm as dominant without pain.     Currently in Pain?  Yes    Pain Score  7     Pain Location  Shoulder    Pain Orientation  Right  Pain Descriptors / Indicators  Aching;Sore    Pain Type  Acute pain    Pain Radiating Towards  elbow    Pain Onset  More than a month ago    Pain Frequency  Constant    Aggravating Factors   use, movement    Pain Relieving Factors  rest, heat, ice, pain medication    Effect of Pain on Daily Activities  unable to use RUE for ADL completion    Multiple Pain Sites  No        [facility] OT Assessment - 01/17/18 1521      Assessment   Medical Diagnosis  s/p right arthroscopic RCR    Referring Provider  Dr. Jones Broom    Onset Date/Surgical Date  12/28/17    Hand Dominance  Right    Next MD Visit  02/09/2018    Prior Therapy  OP prior to sx      Precautions   Precautions  Shoulder    Type of Shoulder Precautions  See protocol: P/ROM weeks 0-5 (1/24-2/28); Week 5 (2/28-3/8): Initiate AA/ROM; weeks 6-8  (3/11-3/29): progress to A/ROM. Weeks 10-16 (4/4): initiate strengthening and prone exercises    Shoulder Interventions  Shoulder sling/immobilizer;At all times;Off for dressing/bathing/exercises      Restrictions   Weight Bearing Restrictions  No      Balance Screen   Has the patient fallen in the past 6 months  Yes    How many times?  1    Has the patient had a decrease in activity level because of a fear of falling?   No    Is the patient reluctant to leave their home because of a fear of falling?   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, picking packages up    Leisure  spending time with animals - 5 dogs, 3 cats, 3 goats       ADL   ADL comments  pt is unable to use RUE for any ADL completion due to pain and precautions      Written Expression   Dominant Hand  Right      Vision - History   Baseline Vision  Wears glasses all the time      Cognition   Overall Cognitive Status  Within Functional Limits for tasks assessed      Observation/Other Assessments   Focus on Therapeutic Outcomes (FOTO)   26/100       ROM / Strength   AROM / PROM / Strength  AROM;PROM;Strength      Palpation   Palpation comment  mod fascial restrictions in right upper arm, trapezius, and scapularis regions       AROM   Overall AROM   Deficits;Unable to assess;Due to precautions;Due to pain      PROM   Overall PROM Comments  Assessed supine, er/IR adducted    PROM Assessment Site  Shoulder    Right/Left Shoulder  Right    Right Shoulder Flexion  130 Degrees    Right Shoulder ABduction  80 Degrees    Right Shoulder Internal Rotation  90 Degrees    Right Shoulder External  Rotation  40 Degrees      Strength    Overall Strength  Deficits;Unable to assess;Due to precautions;Due to pain                      OT Education - 01/17/18 1542    Education provided  Yes    Education Details  table slides    Person(s) Educated  Patient    Methods  Explanation;Demonstration;Handout    Comprehension  Verbalized understanding;Returned demonstration       OT Short Term Goals - 01/17/18 1608      OT SHORT TERM GOAL #1   Title  Patient will be educated on a HEP for improved right shoulder mobility and strength.     Time  4    Period  Weeks    Status  New    Target Date  02/16/18      OT SHORT TERM GOAL #2   Title  Pt will improve P/ROM of RUE to WNL to improve ability to use RUE as gross assist with donning LB clothing    Time  4    Period  Weeks    Status  New      OT SHORT TERM GOAL #3   Title  Pt will decrease pain in RUE to 5/10 to improve ability to sleep.     Time  4    Period  Weeks    Status  New        OT Long Term Goals - 01/17/18 1610      OT LONG TERM GOAL #1   Title  Patient will return to highest level of independence with all B/IADLSs, work, and leisure activities using right arm as dominant.     Time  8    Period  Weeks    Status  New    Target Date  03/18/18      OT LONG TERM GOAL #2   Title  Patient will improve right shoulder A/ROM to WNL for improved ability to complete overhead reaching tasks during ADL completion.     Time  8    Period  Weeks    Status  New      OT LONG TERM GOAL #3   Title  Patient will improve right shoulder strength to 5/5 for improved ability to lift packages overhead at work.    Time  8    Period  Weeks    Status  New      OT LONG TERM GOAL #4   Title  Patient will decrease pain in her right shoulder to 2/10 for improved ability to ADLs using RUE as dominant.     Time  8    Period  Weeks    Status  New      OT LONG TERM GOAL #5   Title  Patient will decrease fasical restrictions to minimal amounts or less in her RUE  for greater mobility needed for daily and work tasks.    Time  8    Period  Weeks    Status  New            Plan - 01/17/18 1604    Clinical Impression Statement  A: Pt is a 57 y/o female s/p right arthroscopic RCR presenting with pain and functional limitations preventing use of RUE for B/IADL completion. Pt is wearing sling at all times and is completing pendulum exercises at home. Provided HEP this session. During P/ROM phase  pt will attend therapy 1x/week and increase to 2x/week at AA/ROM phase due to transportation issues.     Occupational Profile and client history currently impacting functional performance  Pt has had prior sx on both shoulders and is familiar with process, motivated to return to highest level of functioning    Occupational performance deficits (Please refer to evaluation for details):  ADL's;IADL's;Rest and Sleep;Work;Leisure;Social Participation    Rehab Potential  Good    OT Frequency  1x / week 2x/week     OT Duration  2 weeks 6 weeks    OT Treatment/Interventions  Self-care/ADL training;Ultrasound;Patient/family education;Passive range of motion;Cryotherapy;Electrical Stimulation;Moist Heat;Therapeutic exercise;Manual Therapy;Therapeutic activities    Plan  P: Pt will benefit from skilled OT services to decrease pain and fascial restrictions, increase ROM, strength, and functional use of RUE as dominant during functional task completion. Treatment plan: myofascial release, manual therapy, P/ROM, AA/ROM, A/ROM, general RUE and scapular strengthening, scapular mobility, modalities prn    Clinical Decision Making  Limited treatment options, no task modification necessary    OT Home Exercise Plan  2/13: table slides    Consulted and Agree with Plan of Care  Patient       Patient will benefit from skilled therapeutic intervention in order to improve the following deficits and impairments:  Decreased strength, Decreased activity tolerance, Pain, Decreased range of  motion, Increased fascial restrictions, Impaired UE functional use  Visit Diagnosis: Acute pain of right shoulder  Other symptoms and signs involving the musculoskeletal system  Stiffness of right shoulder, not elsewhere classified    Problem List Patient Active Problem List   Diagnosis Date Noted  . Radiculopathy 11/02/2016  . Left leg pain 11/22/2014  . Fibromyalgia 01/30/2012  . Depression with anxiety 01/30/2012  . Lumbar post-laminectomy syndrome 01/30/2012  . Thoracic or lumbosacral neuritis or radiculitis, unspecified 01/30/2012   Ezra Sites, OTR/L  [phone] 01/17/2018, 4:12 PM  [facility] [address] [place], [place], [address] Phone: [phone]   Fax:  [phone]  Name: Melanie Avila MRN: [id number] Date of Birth: 1961-06-21

## 2018-01-17 NOTE — Patient Instructions (Signed)
SHOULDER: Flexion On Table   Place hands on table, elbows straight. Move hips away from body. Press hands down into table.  _15__ reps per set, __1_ sets per day, __3_ days per week  Abduction (Passive)   With arm out to side, resting on table, lower head toward arm, keeping trunk away from table.  Repeat __15__ times. Do __3__ sessions per day.  Copyright  VHI. All rights reserved.     Internal Rotation (Assistive)   Seated with elbow bent at right angle and held against side, slide arm on table surface in an inward arc. Repeat __15__ times. Do _3___ sessions per day. Activity: Use this motion to brush crumbs off the table.  Copyright  VHI. All rights reserved.

## 2018-01-26 ENCOUNTER — Encounter (HOSPITAL_COMMUNITY): Payer: Self-pay | Admitting: Occupational Therapy

## 2018-01-26 ENCOUNTER — Ambulatory Visit (HOSPITAL_COMMUNITY): Payer: BLUE CROSS/BLUE SHIELD | Admitting: Occupational Therapy

## 2018-01-26 DIAGNOSIS — M25511 Pain in right shoulder: Secondary | ICD-10-CM | POA: Diagnosis not present

## 2018-01-26 DIAGNOSIS — R29898 Other symptoms and signs involving the musculoskeletal system: Secondary | ICD-10-CM | POA: Diagnosis not present

## 2018-01-26 DIAGNOSIS — M25611 Stiffness of right shoulder, not elsewhere classified: Secondary | ICD-10-CM | POA: Diagnosis not present

## 2018-01-26 NOTE — Therapy (Signed)
[facility] [address] [place], [place], [address] Phone: [phone]   Fax:  [phone]  Occupational Therapy Treatment  Patient Details  Name: Melanie Avila MRN: [id number] Date of Birth: 03-12-1961 Referring Provider: Dr. Jones Broom   Encounter Date: 01/26/2018  OT End of Session - 01/26/18 1509    Visit Number  2    Number of Visits  16    Date for OT Re-Evaluation  03/18/18 mini-reassessment 02/15/2018    Authorization Type  BC[employer]    Authorization Time Period  90 visits, 2 used    Authorization - Visit Number  4    Authorization - Number of Visits  90    OT Start Time  1433    OT Stop Time  1508    OT Time Calculation (min)  35 min    Activity Tolerance  Patient tolerated treatment well    Behavior During Therapy  [facility] for tasks assessed/performed       Past Medical History:  Diagnosis Date  . Anemia   . Anxiety   . Complication of anesthesia    last shoulder surg 2010-dsc-2 hr after block to surg-neck swelled-hard to tube-stayed RCC  . DDD (degenerative disc disease)   . Depression   . Fibromyalgia   . GERD (gastroesophageal reflux disease)   . Headache    migraines in her younger years  . Heart murmur    since birth and never has been a problem  . History of kidney stones    7 times  . Hyperlipidemia   . Hypertension   . Neuromuscular disorder (HCC)    nerve damage left leg  . PONV (postoperative nausea and vomiting)    shorter surgeries tends to have n/v.     Past Surgical History:  Procedure Laterality Date  . BACK SURGERY  89,02   lumbar x2  . CARPAL TUNNEL RELEASE Bilateral   . COLONOSCOPY N/A 12/09/2015   Procedure: COLONOSCOPY;  Surgeon: Malissa Hippo, MD;  [facility];  Service: Endoscopy;  Laterality: N/A;  830  . SHOULDER ARTHROSCOPY WITH SUBACROMIAL DECOMPRESSION  10/23/2012   Procedure: SHOULDER ARTHROSCOPY WITH SUBACROMIAL DECOMPRESSION;  Surgeon: Wyn Forster., MD;   Location: [facility];  Service: Orthopedics;  Laterality: Left;  LEFT SHOULDER  ARTHROSCOPY WITH SUBACROMIAL DECOMPRESSION, DISTAL CLAVICLE RESECTION, ARTHROSCOPIC SUBSCAPULARIS REPAIR AND OPEN REPAIR OF SUPRASPINATOUS TENDON  . SHOULDER SURGERY Bilateral   . TUBAL LIGATION      There were no vitals filed for this visit.  Subjective Assessment - 01/26/18 1434    Subjective   S: I'm having a hard time finding a spot to do the exercises.     Currently in Pain?  Yes    Pain Score  6     Pain Location  Shoulder    Pain Orientation  Right    Pain Descriptors / Indicators  Aching;Sore    Pain Type  Acute pain    Pain Radiating Towards  elbow     Pain Onset  More than a month ago    Pain Frequency  Constant    Aggravating Factors   use, movement    Pain Relieving Factors  rest, heat, ice, pain medication    Effect of Pain on Daily Activities  mod effect on ADLs    Multiple Pain Sites  No         [facility] OT Assessment - 01/26/18 1434  Assessment   Medical Diagnosis  s/p right arthroscopic RCR      Precautions   Precautions  Shoulder    Type of Shoulder Precautions  See protocol: P/ROM weeks 0-5 (1/24-2/28); Week 5 (2/28-3/8): Initiate AA/ROM; weeks 6-8 (3/11-3/29): progress to A/ROM. Weeks 10-16 (4/4): initiate strengthening and prone exercises    Shoulder Interventions  Shoulder sling/immobilizer;At all times;Off for dressing/bathing/exercises               OT Treatments/Exercises (OP) - 01/26/18 1436      Exercises   Exercises  Shoulder      Shoulder Exercises: Supine   Protraction  PROM;10 reps    Horizontal ABduction  PROM;10 reps    External Rotation  PROM;10 reps    Internal Rotation  PROM;10 reps    Flexion  PROM;10 reps    ABduction  PROM;10 reps      Shoulder Exercises: Seated   Elevation  AROM;10 reps    Extension  AROM;10 reps    Row  AROM;10 reps      Shoulder Exercises: Therapy Ball   Flexion  10 reps    ABduction  10 reps       Shoulder Exercises: ROM/Strengthening   Anterior Glide  5X5"    Caudal Glide  5X5"      Shoulder Exercises: Isometric Strengthening   Flexion  Supine;3X5"    Extension  Supine;3X5"    External Rotation  Supine;3X5"    Internal Rotation  Supine;3X5"    ABduction  Supine;3X5"    ADduction  Supine;3X5"      Manual Therapy   Manual Therapy  Myofascial release    Manual therapy comments  manual therapy intervention completed seperately from all other interventions this date of service.      Myofascial Release  Myofasical release and manual stretching to right upper arm, scapular, and shoulder region to decrease pain and restrictions and improve pain free mobiilty in right shoulder.                OT Short Term Goals - 01/26/18 1510      OT SHORT TERM GOAL #1   Title  Patient will be educated on a HEP for improved right shoulder mobility and strength.     Time  4    Period  Weeks    Status  On-going      OT SHORT TERM GOAL #2   Title  Pt will improve P/ROM of RUE to WNL to improve ability to use RUE as gross assist with donning LB clothing    Time  4    Period  Weeks    Status  On-going      OT SHORT TERM GOAL #3   Title  Pt will decrease pain in RUE to 5/10 to improve ability to sleep.     Time  4    Period  Weeks    Status  On-going        OT Long Term Goals - 01/26/18 1511      OT LONG TERM GOAL #1   Title  Patient will return to highest level of independence with all B/IADLSs, work, and leisure activities using right arm as dominant.     Time  8    Period  Weeks    Status  On-going      OT LONG TERM GOAL #2   Title  Patient will improve right shoulder A/ROM to WNL for improved ability to complete overhead reaching tasks  during ADL completion.     Time  8    Period  Weeks    Status  On-going      OT LONG TERM GOAL #3   Title  Patient will improve right shoulder strength to 5/5 for improved ability to lift packages overhead at work.    Time  8    Period   Weeks    Status  On-going      OT LONG TERM GOAL #4   Title  Patient will decrease pain in her right shoulder to 2/10 for improved ability to ADLs using RUE as dominant.     Time  8    Period  Weeks    Status  On-going      OT LONG TERM GOAL #5   Title  Patient will decrease fasical restrictions to minimal amounts or less in her RUE for greater mobility needed for daily and work tasks.    Time  8    Period  Weeks    Status  On-going            Plan - 01/26/18 1509    Clinical Impression Statement  A: Initiated myofascial release, manual therapy, P/ROM, scapular A/ROM, isometrics, and therapy ball stretches this session. Pt reports her HEP is going well when she is able to find a space to complete her stretches. Verbal cuing for form during session.     Plan  P: Continue with P/ROM per protocol, increase isometric contraction time to 10"       Patient will benefit from skilled therapeutic intervention in order to improve the following deficits and impairments:  Decreased strength, Decreased activity tolerance, Pain, Decreased range of motion, Increased fascial restrictions, Impaired UE functional use  Visit Diagnosis: Acute pain of right shoulder  Other symptoms and signs involving the musculoskeletal system    Problem List Patient Active Problem List   Diagnosis Date Noted  . Radiculopathy 11/02/2016  . Left leg pain 11/22/2014  . Fibromyalgia 01/30/2012  . Depression with anxiety 01/30/2012  . Lumbar post-laminectomy syndrome 01/30/2012  . Thoracic or lumbosacral neuritis or radiculitis, unspecified 01/30/2012   Ezra Sites, OTR/L  [phone] 01/26/2018, 3:11 PM  [facility] [address] [place], [place], [address] Phone: [phone]   Fax:  [phone]  Name: Melanie Avila MRN: [id number] Date of Birth: 04-Sep-1961

## 2018-02-02 ENCOUNTER — Encounter (HOSPITAL_COMMUNITY): Payer: Self-pay | Admitting: Occupational Therapy

## 2018-02-02 ENCOUNTER — Ambulatory Visit (HOSPITAL_COMMUNITY): Payer: BLUE CROSS/BLUE SHIELD | Attending: Orthopedic Surgery | Admitting: Occupational Therapy

## 2018-02-02 DIAGNOSIS — R29898 Other symptoms and signs involving the musculoskeletal system: Secondary | ICD-10-CM | POA: Diagnosis not present

## 2018-02-02 DIAGNOSIS — M25511 Pain in right shoulder: Secondary | ICD-10-CM | POA: Insufficient documentation

## 2018-02-02 DIAGNOSIS — M25611 Stiffness of right shoulder, not elsewhere classified: Secondary | ICD-10-CM | POA: Insufficient documentation

## 2018-02-02 NOTE — Therapy (Signed)
[place], [place], [address] Phone: [phone]   Fax:  [phone]  Occupational Therapy Treatment  Patient Details  Name: Melanie Avila MRN: [id number] Date of Birth: [birth year] Referring Provider: Dr. Tania Ade   Encounter Date: 02/02/2018  OT End of Session - 02/02/18 1558    Visit Number  3    Number of Visits  16    Date for OT Re-Evaluation  03/18/18 mini-reassessment 02/15/2018    Authorization Type  BC[employer]    Authorization Time Period  90 visits, 2 used    Authorization - Visit Number  5    Authorization - Number of Visits  73    OT Start Time  7824    OT Stop Time  1557    OT Time Calculation (min)  38 min    Activity Tolerance  Patient tolerated treatment well    Behavior During Therapy  [facility] for tasks assessed/performed       Past Medical History:  Diagnosis Date  . Anemia   . Anxiety   . Complication of anesthesia    last shoulder surg 2010-dsc-2 hr after block to surg-neck swelled-hard to tube-stayed RCC  . DDD (degenerative disc disease)   . Depression   . Fibromyalgia   . GERD (gastroesophageal reflux disease)   . Headache    migraines in her younger years  . Heart murmur    since birth and never has been a problem  . History of kidney stones    7 times  . Hyperlipidemia   . Hypertension   . Neuromuscular disorder ([place])    nerve damage left leg  . PONV (postoperative nausea and vomiting)    shorter surgeries tends to have n/v.     Past Surgical History:  Procedure Laterality Date  . BACK SURGERY  89,02   lumbar x2  . CARPAL TUNNEL RELEASE Bilateral   . COLONOSCOPY N/A 12/09/2015   Procedure: COLONOSCOPY;  Surgeon: Rogene Houston, MD;  [facility];  Service: Endoscopy;  Laterality: N/A;  830  . SHOULDER ARTHROSCOPY WITH SUBACROMIAL DECOMPRESSION  10/23/2012   Procedure: SHOULDER ARTHROSCOPY WITH SUBACROMIAL DECOMPRESSION;  Surgeon: Cammie Sickle., MD;   Location: [facility];  Service: Orthopedics;  Laterality: Left;  LEFT SHOULDER  ARTHROSCOPY WITH SUBACROMIAL DECOMPRESSION, DISTAL CLAVICLE RESECTION, ARTHROSCOPIC SUBSCAPULARIS REPAIR AND OPEN REPAIR OF SUPRASPINATOUS TENDON  . SHOULDER SURGERY Bilateral   . TUBAL LIGATION      There were no vitals filed for this visit.  Subjective Assessment - 02/02/18 1519    Subjective   S: This rain hurts me.     Currently in Pain?  Yes    Pain Score  5     Pain Location  Shoulder    Pain Orientation  Right    Pain Descriptors / Indicators  Sore;Aching    Pain Type  Acute pain    Pain Radiating Towards  elbow    Pain Onset  More than a month ago    Pain Frequency  Constant    Aggravating Factors   use, movement    Pain Relieving Factors  rest, heat, ice, pain medication    Effect of Pain on Daily Activities  mod effect on ADLs    Multiple Pain Sites  No         [facility] OT Assessment - 02/02/18 1519      Assessment   Medical Diagnosis  s/p right  arthroscopic RCR      Precautions   Precautions  Shoulder    Type of Shoulder Precautions  See protocol: P/ROM weeks 0-5 (1/24-2/28); Week 5 (2/28-3/8): Initiate AA/ROM; weeks 6-8 (3/11-3/29): progress to A/ROM. Weeks 10-16 (4/4): initiate strengthening and prone exercises    Shoulder Interventions  Shoulder sling/immobilizer;At all times;Off for dressing/bathing/exercises               OT Treatments/Exercises (OP) - 02/02/18 1521      Exercises   Exercises  Shoulder      Shoulder Exercises: Supine   Protraction  PROM;AAROM;10 reps    Horizontal ABduction  PROM;AAROM;10 reps    External Rotation  PROM;AAROM;10 reps    Internal Rotation  PROM;AAROM;10 reps    Flexion  PROM;AAROM;10 reps    ABduction  PROM;AAROM;10 reps      Shoulder Exercises: Seated   Protraction  AAROM;10 reps    Horizontal ABduction  AAROM;10 reps    External Rotation  AAROM;10 reps    Internal Rotation  AAROM;10 reps    Flexion  AAROM;10 reps     Abduction  AAROM;10 reps      Shoulder Exercises: ROM/Strengthening   Wall Wash  1'    Thumb Tacks  1'      Manual Therapy   Manual Therapy  Myofascial release    Manual therapy comments  manual therapy intervention completed seperately from all other interventions this date of service.      Myofascial Release  Myofasical release and manual stretching to right upper arm, scapular, and shoulder region to decrease pain and restrictions and improve pain free mobiilty in right shoulder.              OT Education - 02/02/18 1548    Education provided  Yes    Education Details  AA/ROM     Person(s) Educated  Patient    Methods  Explanation;Demonstration;Handout    Comprehension  Verbalized understanding;Returned demonstration       OT Short Term Goals - 01/26/18 1510      OT SHORT TERM GOAL #1   Title  Patient will be educated on a HEP for improved right shoulder mobility and strength.     Time  4    Period  Weeks    Status  On-going      OT SHORT TERM GOAL #2   Title  Pt will improve P/ROM of RUE to WNL to improve ability to use RUE as gross assist with donning LB clothing    Time  4    Period  Weeks    Status  On-going      OT SHORT TERM GOAL #3   Title  Pt will decrease pain in RUE to 5/10 to improve ability to sleep.     Time  4    Period  Weeks    Status  On-going        OT Long Term Goals - 01/26/18 1511      OT LONG TERM GOAL #1   Title  Patient will return to highest level of independence with all B/IADLSs, work, and leisure activities using right arm as dominant.     Time  8    Period  Weeks    Status  On-going      OT LONG TERM GOAL #2   Title  Patient will improve right shoulder A/ROM to WNL for improved ability to complete overhead reaching tasks during ADL completion.  Time  8    Period  Weeks    Status  On-going      OT LONG TERM GOAL #3   Title  Patient will improve right shoulder strength to 5/5 for improved ability to lift packages  overhead at work.    Time  8    Period  Weeks    Status  On-going      OT LONG TERM GOAL #4   Title  Patient will decrease pain in her right shoulder to 2/10 for improved ability to ADLs using RUE as dominant.     Time  8    Period  Weeks    Status  On-going      OT LONG TERM GOAL #5   Title  Patient will decrease fasical restrictions to minimal amounts or less in her RUE for greater mobility needed for daily and work tasks.    Time  8    Period  Weeks    Status  On-going            Plan - 02/02/18 1559    Clinical Impression Statement  A: Progressed to AA/ROM per protocol, completing in supine, sitting, and adding wall wash and thumb tacks. Pt reports no increased pain during exercises, ROM is WNL. OT providing verbal cuing for form and technique. Updated HEP for AA/ROM.     Plan  P: Continue with AA/ROM and follow up on HEP, add prot/ret/elev/dep       Patient will benefit from skilled therapeutic intervention in order to improve the following deficits and impairments:  Decreased strength, Decreased activity tolerance, Pain, Decreased range of motion, Increased fascial restrictions, Impaired UE functional use  Visit Diagnosis: Acute pain of right shoulder  Other symptoms and signs involving the musculoskeletal system    Problem List Patient Active Problem List   Diagnosis Date Noted  . Radiculopathy 11/02/2016  . Left leg pain 11/22/2014  . Fibromyalgia 01/30/2012  . Depression with anxiety 01/30/2012  . Lumbar post-laminectomy syndrome 01/30/2012  . Thoracic or lumbosacral neuritis or radiculitis, unspecified 01/30/2012   Melanie Avila, OTR/L  [phone] 02/02/2018, 4:01 PM  [facility] [address] [place], [place], [address] Phone: [phone]   Fax:  [phone]  Name: Melanie Avila MRN: [id number] Date of Birth: [birth year]

## 2018-02-02 NOTE — Patient Instructions (Signed)
Perform each exercise ___15_____ reps. 2-3x days.   Protraction - STANDING  Start by holding a wand or cane at chest height.  Next, slowly push the wand outwards in front of your body so that your elbows become fully straightened. Then, return to the original position.     Shoulder FLEXION - STANDING - PALMS UP  In the standing position, hold a wand/cane with both arms, palms up on both sides. Raise up the wand/cane allowing your unaffected arm to perform most of the effort. Your affected arm should be partially relaxed.      Internal/External ROTATION - STANDING  In the standing position, hold a wand/cane with both hands keeping your elbows bent. Move your arms and wand/cane to one side.  Your affected arm should be partially relaxed while your unaffected arm performs most of the effort.       Shoulder ABDUCTION - STANDING  While holding a wand/cane palm face up on the injured side and palm face down on the uninjured side, slowly raise up your injured arm to the side.            Horizontal Abduction/Adduction      Straight arms holding cane at shoulder height, bring cane to right, center, left. Repeat starting to left.   Copyright  VHI. All rights reserved.       

## 2018-02-05 ENCOUNTER — Other Ambulatory Visit: Payer: Self-pay

## 2018-02-05 ENCOUNTER — Ambulatory Visit (HOSPITAL_COMMUNITY): Payer: BLUE CROSS/BLUE SHIELD

## 2018-02-05 ENCOUNTER — Encounter (HOSPITAL_COMMUNITY): Payer: Self-pay

## 2018-02-05 DIAGNOSIS — M25511 Pain in right shoulder: Secondary | ICD-10-CM

## 2018-02-05 DIAGNOSIS — R29898 Other symptoms and signs involving the musculoskeletal system: Secondary | ICD-10-CM

## 2018-02-05 DIAGNOSIS — M25611 Stiffness of right shoulder, not elsewhere classified: Secondary | ICD-10-CM | POA: Diagnosis not present

## 2018-02-05 NOTE — Therapy (Signed)
[facility] [place], [place], [address] Phone: [phone]   Fax:  [phone]  Occupational Therapy Treatment  Patient Details  Name: Melanie Avila MRN: [id number] Date of Birth: 01-10-1961 Referring Provider: Dr. Tania Ade   Encounter Date: 02/05/2018  OT End of Session - 02/05/18 1615    Visit Number  4    Number of Visits  16    Date for OT Re-Evaluation  03/18/18 mini-reassessment 02/15/2018    Authorization Type  BCBS    Authorization Time Period  90 visits, 2 used    Authorization - Visit Number  6    Authorization - Number of Visits  59    OT Start Time  1435    OT Stop Time  1515    OT Time Calculation (min)  40 min    Activity Tolerance  Patient tolerated treatment well    Behavior During Therapy  [facility] for tasks assessed/performed       Past Medical History:  Diagnosis Date  . Anemia   . Anxiety   . Complication of anesthesia    last shoulder surg 2010-dsc-2 hr after block to surg-neck swelled-hard to tube-stayed RCC  . DDD (degenerative disc disease)   . Depression   . Fibromyalgia   . GERD (gastroesophageal reflux disease)   . Headache    migraines in her younger years  . Heart murmur    since birth and never has been a problem  . History of kidney stones    7 times  . Hyperlipidemia   . Hypertension   . Neuromuscular disorder ([place])    nerve damage left leg  . PONV (postoperative nausea and vomiting)    shorter surgeries tends to have n/v.     Past Surgical History:  Procedure Laterality Date  . BACK SURGERY  89,02   lumbar x2  . CARPAL TUNNEL RELEASE Bilateral   . COLONOSCOPY N/A 12/09/2015   Procedure: COLONOSCOPY;  Surgeon: Rogene Houston, MD;  [facility];  Service: Endoscopy;  Laterality: N/A;  830  . SHOULDER ARTHROSCOPY WITH SUBACROMIAL DECOMPRESSION  10/23/2012   Procedure: SHOULDER ARTHROSCOPY WITH SUBACROMIAL DECOMPRESSION;  Surgeon: Cammie Sickle., MD;   Location: [facility];  Service: Orthopedics;  Laterality: Left;  LEFT SHOULDER  ARTHROSCOPY WITH SUBACROMIAL DECOMPRESSION, DISTAL CLAVICLE RESECTION, ARTHROSCOPIC SUBSCAPULARIS REPAIR AND OPEN REPAIR OF SUPRASPINATOUS TENDON  . SHOULDER SURGERY Bilateral   . TUBAL LIGATION      There were no vitals filed for this visit.  Subjective Assessment - 02/05/18 1507    Subjective   S: It's still as sore as it was last time.    Currently in Pain?  Yes    Pain Score  6     Pain Location  Shoulder    Pain Orientation  Right    Pain Descriptors / Indicators  Sore;Aching    Pain Type  Acute pain         [facility] OT Assessment - 02/05/18 1507      Assessment   Medical Diagnosis  s/p right arthroscopic RCR      Precautions   Precautions  Shoulder    Type of Shoulder Precautions  See protocol: P/ROM weeks 0-5 (1/24-2/28); Week 5 (2/28-3/8): Initiate AA/ROM; weeks 6-8 (3/11-3/29): progress to A/ROM. Weeks 10-16 (4/4): initiate strengthening and prone exercises    Shoulder Interventions  Shoulder sling/immobilizer;At all times;Off for dressing/bathing/exercises  OT Treatments/Exercises (OP) - 02/05/18 1507      Exercises   Exercises  Shoulder      Shoulder Exercises: Supine   Protraction  PROM;5 reps;AAROM;12 reps    Horizontal ABduction  PROM;5 reps;AAROM;12 reps    External Rotation  PROM;5 reps;AAROM;12 reps    Internal Rotation  PROM;5 reps;AAROM;12 reps    Flexion  PROM;5 reps;AAROM;12 reps    ABduction  PROM;5 reps;AAROM;12 reps      Shoulder Exercises: Seated   Protraction  AAROM;12 reps    Horizontal ABduction  AAROM;12 reps    External Rotation  AAROM;12 reps    Internal Rotation  AAROM;12 reps    Flexion  AAROM;12 reps    Abduction  AAROM;12 reps      Shoulder Exercises: ROM/Strengthening   Wall Wash  1'    Thumb Tacks  1'    Proximal Shoulder Strengthening, Seated  12X no rest breaks    Prot/Ret//Elev/Dep  1'      Manual Therapy    Manual Therapy  Myofascial release    Manual therapy comments  manual therapy intervention completed seperately from all other interventions this date of service.      Myofascial Release  Myofasical release and manual stretching to right upper arm, scapular, and shoulder region to decrease pain and restrictions and improve pain free mobiilty in right shoulder.                OT Short Term Goals - 01/26/18 1510      OT SHORT TERM GOAL #1   Title  Patient will be educated on a HEP for improved right shoulder mobility and strength.     Time  4    Period  Weeks    Status  On-going      OT SHORT TERM GOAL #2   Title  Pt will improve P/ROM of RUE to WNL to improve ability to use RUE as gross assist with donning LB clothing    Time  4    Period  Weeks    Status  On-going      OT SHORT TERM GOAL #3   Title  Pt will decrease pain in RUE to 5/10 to improve ability to sleep.     Time  4    Period  Weeks    Status  On-going        OT Long Term Goals - 01/26/18 1511      OT LONG TERM GOAL #1   Title  Patient will return to highest level of independence with all B/IADLSs, work, and leisure activities using right arm as dominant.     Time  8    Period  Weeks    Status  On-going      OT LONG TERM GOAL #2   Title  Patient will improve right shoulder A/ROM to WNL for improved ability to complete overhead reaching tasks during ADL completion.     Time  8    Period  Weeks    Status  On-going      OT LONG TERM GOAL #3   Title  Patient will improve right shoulder strength to 5/5 for improved ability to lift packages overhead at work.    Time  8    Period  Weeks    Status  On-going      OT LONG TERM GOAL #4   Title  Patient will decrease pain in her right shoulder to 2/10 for improved ability to ADLs  using RUE as dominant.     Time  8    Period  Weeks    Status  On-going      OT LONG TERM GOAL #5   Title  Patient will decrease fasical restrictions to minimal amounts or less  in her RUE for greater mobility needed for daily and work tasks.    Time  8    Period  Weeks    Status  On-going            Plan - 02/05/18 1615    Clinical Impression Statement  A: HEP going well per patient. Increased to 12 repetitions for AA/ROM and added pro/ret/elev/dep. Pt with good form and no complaints of pain. VC for form and technique as needed.     Plan  P: Progress to A/ROM    Consulted and Agree with Plan of Care  Patient       Patient will benefit from skilled therapeutic intervention in order to improve the following deficits and impairments:  Decreased strength, Decreased activity tolerance, Pain, Decreased range of motion, Increased fascial restrictions, Impaired UE functional use  Visit Diagnosis: Acute pain of right shoulder  Other symptoms and signs involving the musculoskeletal system  Stiffness of right shoulder, not elsewhere classified    Problem List Patient Active Problem List   Diagnosis Date Noted  . Radiculopathy 11/02/2016  . Left leg pain 11/22/2014  . Fibromyalgia 01/30/2012  . Depression with anxiety 01/30/2012  . Lumbar post-laminectomy syndrome 01/30/2012  . Thoracic or lumbosacral neuritis or radiculitis, unspecified 01/30/2012   Ailene Ravel, OTR/L,CBIS  [phone]  02/05/2018, 4:19 PM  [facility] [address] [place], [place], [address] Phone: [phone]   Fax:  [phone]  Name: Melanie Avila MRN: [id number] Date of Birth: [birth year]

## 2018-02-07 ENCOUNTER — Encounter (HOSPITAL_COMMUNITY): Payer: BLUE CROSS/BLUE SHIELD | Admitting: Occupational Therapy

## 2018-02-08 DIAGNOSIS — M5137 Other intervertebral disc degeneration, lumbosacral region: Secondary | ICD-10-CM | POA: Diagnosis not present

## 2018-02-08 DIAGNOSIS — M47817 Spondylosis without myelopathy or radiculopathy, lumbosacral region: Secondary | ICD-10-CM | POA: Diagnosis not present

## 2018-02-08 DIAGNOSIS — Z79899 Other long term (current) drug therapy: Secondary | ICD-10-CM | POA: Diagnosis not present

## 2018-02-08 DIAGNOSIS — Z9889 Other specified postprocedural states: Secondary | ICD-10-CM | POA: Diagnosis not present

## 2018-02-08 DIAGNOSIS — G894 Chronic pain syndrome: Secondary | ICD-10-CM | POA: Diagnosis not present

## 2018-02-08 DIAGNOSIS — Z79891 Long term (current) use of opiate analgesic: Secondary | ICD-10-CM | POA: Diagnosis not present

## 2018-02-08 DIAGNOSIS — M79643 Pain in unspecified hand: Secondary | ICD-10-CM | POA: Diagnosis not present

## 2018-02-12 ENCOUNTER — Ambulatory Visit (HOSPITAL_COMMUNITY): Payer: BLUE CROSS/BLUE SHIELD

## 2018-02-12 ENCOUNTER — Other Ambulatory Visit: Payer: Self-pay

## 2018-02-12 ENCOUNTER — Encounter (HOSPITAL_COMMUNITY): Payer: Self-pay

## 2018-02-12 DIAGNOSIS — R29898 Other symptoms and signs involving the musculoskeletal system: Secondary | ICD-10-CM

## 2018-02-12 DIAGNOSIS — M25511 Pain in right shoulder: Secondary | ICD-10-CM

## 2018-02-12 DIAGNOSIS — M25611 Stiffness of right shoulder, not elsewhere classified: Secondary | ICD-10-CM | POA: Diagnosis not present

## 2018-02-12 NOTE — Therapy (Signed)
[facility] [address] [place], [place], [address] Phone: [phone]   Fax:  [phone]  Occupational Therapy Treatment  Patient Details  Name: Melanie Avila MRN: [id number] Date of Birth: 05/09/1961 Referring Provider: Dr. Jones BroomJustin Chandler   Encounter Date: 02/12/2018  OT End of Session - 02/12/18 1512    Visit Number  5    Number of Visits  16    Date for OT Re-Evaluation  03/18/18 mini-reassessment 02/15/2018    Authorization Type  BC[employer]    Authorization Time Period  90 visits, 2 used    Authorization - Visit Number  7    Authorization - Number of Visits  90    OT Start Time  1435    OT Stop Time  1515    OT Time Calculation (min)  40 min    Activity Tolerance  Patient tolerated treatment well    Behavior During Therapy  [facility] assessed/performed       Past Medical History:  Diagnosis Date  . Anemia   . Anxiety   . Complication of anesthesia    last shoulder surg 2010-dsc-2 hr after block to surg-neck swelled-hard to tube-stayed RCC  . DDD (degenerative disc disease)   . Depression   . Fibromyalgia   . GERD (gastroesophageal reflux disease)   . Headache    migraines in her younger years  . Heart murmur    since birth and never has been a problem  . History of kidney stones    7 times  . Hyperlipidemia   . Hypertension   . Neuromuscular disorder (HCC)    nerve damage left leg  . PONV (postoperative nausea and vomiting)    shorter surgeries tends to have n/v.     Past Surgical History:  Procedure Laterality Date  . BACK SURGERY  89,02   lumbar x2  . CARPAL TUNNEL RELEASE Bilateral   . COLONOSCOPY N/A 12/09/2015   Procedure: COLONOSCOPY;  Surgeon: Malissa HippoNajeeb U Rehman, MD;  [facility];  Service: Endoscopy;  Laterality: N/A;  830  . SHOULDER ARTHROSCOPY WITH SUBACROMIAL DECOMPRESSION  10/23/2012   Procedure: SHOULDER ARTHROSCOPY WITH SUBACROMIAL DECOMPRESSION;  Surgeon: Wyn Forsterobert V Sypher Jr., MD;   Location: [facility];  Service: Orthopedics;  Laterality: Left;  LEFT SHOULDER  ARTHROSCOPY WITH SUBACROMIAL DECOMPRESSION, DISTAL CLAVICLE RESECTION, ARTHROSCOPIC SUBSCAPULARIS REPAIR AND OPEN REPAIR OF SUPRASPINATOUS TENDON  . SHOULDER SURGERY Bilateral   . TUBAL LIGATION      There were no vitals filed for this visit.  Subjective Assessment - 02/12/18 1537    Subjective   S: I had to put a bra on, on Friday and when I did that the strap just hit a certain spot and I was in so much pain. I could barely raise my arm up.     Currently in Pain?  Yes    Pain Score  6     Pain Location  Shoulder    Pain Orientation  Right    Pain Descriptors / Indicators  Aching;Sore    Pain Type  Acute pain    Pain Radiating Towards  N/A    Pain Onset  More than a month ago    Pain Frequency  Constant    Aggravating Factors   use, movement, bra strap    Pain Relieving Factors  rest, heat, ice, pain medication    Effect of Pain on Daily Activities  mod effect    Multiple  Pain Sites  No         [facility] OT Assessment - 02/12/18 1539      Assessment   Medical Diagnosis  s/p right arthroscopic RCR      Precautions   Precautions  Shoulder    Type of Shoulder Precautions  See protocol: P/ROM weeks 0-5 (1/24-2/28); Week 5 (2/28-3/8): Initiate AA/ROM; weeks 6-8 (3/11-3/29): progress to A/ROM. Weeks 10-16 (4/4): initiate strengthening and prone exercises               OT Treatments/Exercises (OP) - 02/12/18 1504      Exercises   Exercises  Shoulder      Shoulder Exercises: Supine   Protraction  PROM;5 reps;AROM;10 reps    Horizontal ABduction  PROM;5 reps;AROM;10 reps    External Rotation  PROM;5 reps;AROM;10 reps    Internal Rotation  PROM;5 reps;AROM;10 reps    Flexion  PROM;5 reps;AROM;10 reps    ABduction  PROM;5 reps;AROM;10 reps      Shoulder Exercises: Seated   Protraction  AROM;10 reps    Horizontal ABduction  AROM;10 reps    External Rotation  AROM;10 reps     Internal Rotation  AROM;10 reps    Flexion  AROM;10 reps    Abduction  AROM;10 reps      Shoulder Exercises: ROM/Strengthening   "W" Arms  10X    X to V Arms  10X    Proximal Shoulder Strengthening, Supine  10X no rest breaks    Proximal Shoulder Strengthening, Seated  10X no rest breaks      Manual Therapy   Manual Therapy  Myofascial release    Manual therapy comments  manual therapy intervention completed seperately from all other interventions this date of service.      Myofascial Release  Myofasical release and manual stretching to right upper arm, scapular, and shoulder region to decrease pain and restrictions and improve pain free mobiilty in right shoulder.              OT Education - 02/12/18 1514    Education provided  Yes    Education Details  A/ROM shoulder exercises    Person(s) Educated  Patient    Methods  Explanation;Demonstration;Handout;Verbal cues    Comprehension  Returned demonstration;Verbalized understanding       OT Short Term Goals - 01/26/18 1510      OT SHORT TERM GOAL #1   Title  Patient will be educated on a HEP for improved right shoulder mobility and strength.     Time  4    Period  Weeks    Status  On-going      OT SHORT TERM GOAL #2   Title  Pt will improve P/ROM of RUE to WNL to improve ability to use RUE as gross assist with donning LB clothing    Time  4    Period  Weeks    Status  On-going      OT SHORT TERM GOAL #3   Title  Pt will decrease pain in RUE to 5/10 to improve ability to sleep.     Time  4    Period  Weeks    Status  On-going        OT Long Term Goals - 01/26/18 1511      OT LONG TERM GOAL #1   Title  Patient will return to highest level of independence with all B/IADLSs, work, and leisure activities using right arm as dominant.  Time  8    Period  Weeks    Status  On-going      OT LONG TERM GOAL #2   Title  Patient will improve right shoulder A/ROM to WNL for improved ability to complete overhead  reaching tasks during ADL completion.     Time  8    Period  Weeks    Status  On-going      OT LONG TERM GOAL #3   Title  Patient will improve right shoulder strength to 5/5 for improved ability to lift packages overhead at work.    Time  8    Period  Weeks    Status  On-going      OT LONG TERM GOAL #4   Title  Patient will decrease pain in her right shoulder to 2/10 for improved ability to ADLs using RUE as dominant.     Time  8    Period  Weeks    Status  On-going      OT LONG TERM GOAL #5   Title  Patient will decrease fasical restrictions to minimal amounts or less in her RUE for greater mobility needed for daily and work tasks.    Time  8    Period  Weeks    Status  On-going            Plan - 02/12/18 1540    Clinical Impression Statement  A: Progressed to A/ROM supine and seated. Pt reports relief with being able to move her arm functionally during session as she was uable to do so yesterdau. HEP was updated. VC for form and technique.     Plan  P: Follow up on HEP. Continue with A/ROM. Continue with scapular theraband exercises.     Consulted and Agree with Plan of Care  Patient       Patient will benefit from skilled therapeutic intervention in order to improve the following deficits and impairments:  Decreased strength, Decreased activity tolerance, Pain, Decreased range of motion, Increased fascial restrictions, Impaired UE functional use  Visit Diagnosis: Acute pain of right shoulder  Other symptoms and signs involving the musculoskeletal system  Stiffness of right shoulder, not elsewhere classified    Problem List Patient Active Problem List   Diagnosis Date Noted  . Radiculopathy 11/02/2016  . Left leg pain 11/22/2014  . Fibromyalgia 01/30/2012  . Depression with anxiety 01/30/2012  . Lumbar post-laminectomy syndrome 01/30/2012  . Thoracic or lumbosacral neuritis or radiculitis, unspecified 01/30/2012   Limmie Patricia, OTR/L,CBIS   [phone]  02/12/2018, 3:43 PM  [facility] [address] [place], [place], [address] Phone: [phone]   Fax:  [phone]  Name: Melanie Avila MRN: [id number] Date of Birth: 10/09/1961

## 2018-02-12 NOTE — Patient Instructions (Signed)
Repeat all exercises 10-15 times, 1-2 times per day.  1) Shoulder Protraction    Begin with elbows by your side, slowly "punch" straight out in front of you.      2) Shoulder Flexion  Standing:         Begin with arms at your side with thumbs pointed up, slowly raise both arms up and forward towards overhead.       3) Horizontal abduction/adduction   Standing:           Begin with arms straight out in front of you, bring out to the side in at "T" shape. Keep arms straight entire time.        4) Internal & External Rotation    *No band* -Stand with elbows at the side and elbows bent 90 degrees. Move your forearms away from your body, then bring back inward toward the body.     5) Shoulder Abduction   Standing:       Lying on your back begin with your arms flat on the table next to your side. Slowly move your arms out to the side so that they go overhead, in a jumping jack or snow angel movement.    6) X to V arms (cheerleader move):  Begin with arms straight down, crossed in front of body in an "X". Keeping arms crossed, lift arms straight up overhead. Then spread arms apart into a "V" shape.  Bring back together into x and lower down to starting position.    

## 2018-02-14 ENCOUNTER — Ambulatory Visit (HOSPITAL_COMMUNITY): Payer: BLUE CROSS/BLUE SHIELD | Admitting: Occupational Therapy

## 2018-02-14 ENCOUNTER — Encounter (HOSPITAL_COMMUNITY): Payer: Self-pay | Admitting: Occupational Therapy

## 2018-02-14 DIAGNOSIS — R29898 Other symptoms and signs involving the musculoskeletal system: Secondary | ICD-10-CM | POA: Diagnosis not present

## 2018-02-14 DIAGNOSIS — M25511 Pain in right shoulder: Secondary | ICD-10-CM | POA: Diagnosis not present

## 2018-02-14 DIAGNOSIS — M25611 Stiffness of right shoulder, not elsewhere classified: Secondary | ICD-10-CM | POA: Diagnosis not present

## 2018-02-14 NOTE — Therapy (Signed)
[place], [place], [address] Phone: [phone]   Fax:  [phone]  Occupational Therapy Treatment  Patient Details  Name: COUNTESS BIEBEL MRN: [id number] Date of Birth: 08-01-1961 Referring Provider: Dr. Tania Ade   Encounter Date: 02/14/2018  OT End of Session - 02/14/18 1501    Visit Number  6    Number of Visits  16    Date for OT Re-Evaluation  03/18/18    Authorization Type  BCBS    Authorization Time Period  90 visits, 2 used    Authorization - Visit Number  8    Authorization - Number of Visits  25    OT Start Time  1435    OT Stop Time  1516    OT Time Calculation (min)  41 min    Activity Tolerance  Patient tolerated treatment well    Behavior During Therapy  [facility] for tasks assessed/performed       Past Medical History:  Diagnosis Date  . Anemia   . Anxiety   . Complication of anesthesia    last shoulder surg 2010-dsc-2 hr after block to surg-neck swelled-hard to tube-stayed RCC  . DDD (degenerative disc disease)   . Depression   . Fibromyalgia   . GERD (gastroesophageal reflux disease)   . Headache    migraines in her younger years  . Heart murmur    since birth and never has been a problem  . History of kidney stones    7 times  . Hyperlipidemia   . Hypertension   . Neuromuscular disorder ([place])    nerve damage left leg  . PONV (postoperative nausea and vomiting)    shorter surgeries tends to have n/v.     Past Surgical History:  Procedure Laterality Date  . BACK SURGERY  89,02   lumbar x2  . CARPAL TUNNEL RELEASE Bilateral   . COLONOSCOPY N/A 12/09/2015   Procedure: COLONOSCOPY;  Surgeon: Rogene Houston, MD;  [facility];  Service: Endoscopy;  Laterality: N/A;  830  . SHOULDER ARTHROSCOPY WITH SUBACROMIAL DECOMPRESSION  10/23/2012   Procedure: SHOULDER ARTHROSCOPY WITH SUBACROMIAL DECOMPRESSION;  Surgeon: Cammie Sickle., MD;  Location: [place];  Service: Orthopedics;  Laterality: Left;  LEFT SHOULDER  ARTHROSCOPY WITH SUBACROMIAL DECOMPRESSION, DISTAL CLAVICLE RESECTION, ARTHROSCOPIC SUBSCAPULARIS REPAIR AND OPEN REPAIR OF SUPRASPINATOUS TENDON  . SHOULDER SURGERY Bilateral   . TUBAL LIGATION      There were no vitals filed for this visit.  Subjective Assessment - 02/14/18 1437    Subjective   S: It scares me every time it pops.     Currently in Pain?  Yes    Pain Score  6     Pain Location  Shoulder    Pain Orientation  Right    Pain Descriptors / Indicators  Aching;Sore    Pain Type  Acute pain    Pain Radiating Towards  n/a    Pain Onset  More than a month ago    Pain Frequency  Constant    Aggravating Factors   use, movement, bra strap    Pain Relieving Factors  rest, heat, ice, pain medication    Effect of Pain on Daily Activities  mod effect    Multiple Pain Sites  No         [facility] OT Assessment - 02/14/18 1437      Assessment   Medical Diagnosis  s/p  right arthroscopic RCR      Precautions   Precautions  Shoulder    Type of Shoulder Precautions  See protocol: P/ROM weeks 0-5 (1/24-2/28); Week 5 (2/28-3/8): Initiate AA/ROM; weeks 6-8 (3/11-3/29): progress to A/ROM. Weeks 10-16 (4/4): initiate strengthening and prone exercises      Palpation   Palpation comment  mod fascial restrictions in right upper arm, trapezius, and scapularis regions       AROM   Overall AROM Comments  assessed in seated, external and internal rotation with shoudler abducted to 90     AROM Assessment Site  Shoulder    Right Shoulder Flexion  170 Degrees 160 previous    Right Shoulder ABduction  180 Degrees 94 previous    Right Shoulder Internal Rotation  44 Degrees 35 previous    Right Shoulder External Rotation  90 Degrees 65 previous      PROM   Overall PROM Comments  Assessed supine, er/IR adducted    PROM Assessment Site  Shoulder    Right Shoulder Flexion  180 Degrees 130 previous    Right Shoulder ABduction  180  Degrees 80 previous    Right Shoulder Internal Rotation  90 Degrees same as previous    Right Shoulder External Rotation  80 Degrees 40 previous      Strength   Overall Strength Comments  assessed in seated, external and internal rotation with shoulder abducted to 90     Strength Assessment Site  Shoulder    Right Shoulder Flexion  4/5 4-/5 previous    Right Shoulder ABduction  4-/5 same as previous    Right Shoulder Internal Rotation  4/5 same as previous    Right Shoulder External Rotation  4/5 same as previous               OT Treatments/Exercises (OP) - 02/14/18 1438      Exercises   Exercises  Shoulder      Shoulder Exercises: Supine   Protraction  PROM;5 reps;AROM;10 reps    Horizontal ABduction  PROM;5 reps;AROM;10 reps    External Rotation  PROM;5 reps;AROM;10 reps    Internal Rotation  PROM;5 reps;AROM;10 reps    Flexion  PROM;5 reps;AROM;10 reps    ABduction  PROM;5 reps;AROM;10 reps      Shoulder Exercises: Seated   Protraction  AROM;10 reps    Horizontal ABduction  AROM;10 reps    External Rotation  AROM;10 reps    Internal Rotation  AROM;10 reps    Flexion  AROM;10 reps    Abduction  AROM;10 reps      Shoulder Exercises: Standing   Extension  Theraband;10 reps    Theraband Level (Shoulder Extension)  Level 2 (Red)    Row  Theraband;10 reps    Theraband Level (Shoulder Row)  Level 2 (Red)    Retraction  Theraband;10 reps    Theraband Level (Shoulder Retraction)  Level 2 (Red)      Shoulder Exercises: ROM/Strengthening   "W" Arms  10X    X to V Arms  10X    Proximal Shoulder Strengthening, Supine  10X no rest breaks    Proximal Shoulder Strengthening, Seated  10X no rest breaks      Manual Therapy   Manual Therapy  Myofascial release    Manual therapy comments  manual therapy intervention completed seperately from all other interventions this date of service.      Myofascial Release  Myofasical release and manual stretching to right upper arm,  scapular, and shoulder region to decrease pain and restrictions and improve pain free mobiilty in right shoulder.                OT Short Term Goals - 02/14/18 1714      OT SHORT TERM GOAL #1   Title  Patient will be educated on a HEP for improved right shoulder mobility and strength.     Time  4    Period  Weeks    Status  On-going      OT SHORT TERM GOAL #2   Title  Pt will improve P/ROM of RUE to WNL to improve ability to use RUE as gross assist with donning LB clothing    Time  4    Period  Weeks    Status  Achieved      OT SHORT TERM GOAL #3   Title  Pt will decrease pain in RUE to 5/10 to improve ability to sleep.     Time  4    Period  Weeks    Status  Achieved        OT Long Term Goals - 02/14/18 1714      OT LONG TERM GOAL #1   Title  Patient will return to highest level of independence with all B/IADLSs, work, and leisure activities using right arm as dominant.     Time  8    Period  Weeks    Status  On-going      OT LONG TERM GOAL #2   Title  Patient will improve right shoulder A/ROM to WNL for improved ability to complete overhead reaching tasks during ADL completion.     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    Time  8    Period  Weeks    Status  On-going      OT LONG TERM GOAL #4   Title  Patient will decrease pain in her right shoulder to 2/10 for improved ability to ADLs using RUE as dominant.     Time  8    Period  Weeks    Status  On-going      OT LONG TERM GOAL #5   Title  Patient will decrease fasical restrictions to minimal amounts or less in her RUE for greater mobility needed for daily and work tasks.    Time  8    Period  Weeks    Status  On-going            Plan - 02/14/18 1712    Clinical Impression Statement  A: Mini-reassessment completed this session, pt has met 2  STGs and 1 LTG thus far and is progressing towards remaining goals. Added scapular theraband this session and continued with A/ROM supine and seated. Pt with occasional popping, no specific movement noted to increase popping. Pt reports HEP is going well. Verbal cuing for form and technique with exercises.     Plan  P: Add scapular theraband to HEP, add ball on wall and overhead lacing       Patient will benefit from skilled therapeutic intervention in order to improve the following deficits and impairments:  Decreased strength, Decreased activity tolerance, Pain, Decreased range of motion, Increased fascial restrictions, Impaired UE functional use  Visit Diagnosis: Acute pain of right shoulder  Other symptoms and signs involving the musculoskeletal system  Stiffness of right shoulder, not elsewhere classified    Problem List Patient Active Problem List   Diagnosis Date Noted  . Radiculopathy 11/02/2016  . Left leg pain 11/22/2014  . Fibromyalgia 01/30/2012  . Depression with anxiety 01/30/2012  . Lumbar post-laminectomy syndrome 01/30/2012  . Thoracic or lumbosacral neuritis or radiculitis, unspecified 01/30/2012   Guadelupe Sabin, OTR/L  [phone] 02/14/2018, 5:15 PM  [facility] [address] [place], [place], [address] Phone: [phone]   Fax:  [phone]  Name: SAKAI HEINLE MRN: [id number] Date of Birth: 1961-04-30

## 2018-02-19 ENCOUNTER — Encounter (HOSPITAL_COMMUNITY): Payer: Self-pay

## 2018-02-19 ENCOUNTER — Ambulatory Visit (HOSPITAL_COMMUNITY): Payer: BLUE CROSS/BLUE SHIELD

## 2018-02-19 ENCOUNTER — Other Ambulatory Visit: Payer: Self-pay

## 2018-02-19 DIAGNOSIS — M25611 Stiffness of right shoulder, not elsewhere classified: Secondary | ICD-10-CM | POA: Diagnosis not present

## 2018-02-19 DIAGNOSIS — R29898 Other symptoms and signs involving the musculoskeletal system: Secondary | ICD-10-CM | POA: Diagnosis not present

## 2018-02-19 DIAGNOSIS — M25511 Pain in right shoulder: Secondary | ICD-10-CM

## 2018-02-19 NOTE — Therapy (Signed)
[facility] [address] , [place], [address] Phone: [phone]   Fax:  [phone]  Occupational Therapy Treatment  Patient Details  Name: Melanie Avila MRN: [id number] Date of Birth: 10/25/1961 Referring Provider: Dr. Jones BroomJustin Chandler   Encounter Date: 02/19/2018  OT End of Session - 02/19/18 1514    Visit Number  7    Number of Visits  16    Date for OT Re-Evaluation  03/18/18    Authorization Type  BCBS    Authorization Time Period  90 visits, 2 used    Authorization - Visit Number  9    Authorization - Number of Visits  90    OT Start Time  1440    OT Stop Time  1515    OT Time Calculation (min)  35 min    Activity Tolerance  Patient tolerated treatment well    Behavior During Therapy  WFL for tasks assessed/performed       Past Medical History:  Diagnosis Date  . Anemia   . Anxiety   . Complication of anesthesia    last shoulder surg 2010-dsc-2 hr after block to surg-neck swelled-hard to tube-stayed RCC  . DDD (degenerative disc disease)   . Depression   . Fibromyalgia   . GERD (gastroesophageal reflux disease)   . Headache    migraines in her younger years  . Heart murmur    since birth and never has been a problem  . History of kidney stones    7 times  . Hyperlipidemia   . Hypertension   . Neuromuscular disorder (HCC)    nerve damage left leg  . PONV (postoperative nausea and vomiting)    shorter surgeries tends to have n/v.     Past Surgical History:  Procedure Laterality Date  . BACK SURGERY  89,02   lumbar x2  . CARPAL TUNNEL RELEASE Bilateral   . COLONOSCOPY N/A 12/09/2015   Procedure: COLONOSCOPY;  Surgeon: Malissa HippoNajeeb U Rehman, MD;  [facility];  Service: Endoscopy;  Laterality: N/A;  830  . SHOULDER ARTHROSCOPY WITH SUBACROMIAL DECOMPRESSION  10/23/2012   Procedure: SHOULDER ARTHROSCOPY WITH SUBACROMIAL DECOMPRESSION;  Surgeon: Wyn Forsterobert V Sypher Jr., MD;  Location: [facility];  Service: Orthopedics;  Laterality: Left;  LEFT SHOULDER  ARTHROSCOPY WITH SUBACROMIAL DECOMPRESSION, DISTAL CLAVICLE RESECTION, ARTHROSCOPIC SUBSCAPULARIS REPAIR AND OPEN REPAIR OF SUPRASPINATOUS TENDON  . SHOULDER SURGERY Bilateral   . TUBAL LIGATION      There were no vitals filed for this visit.  Subjective Assessment - 02/19/18 1503    Subjective   S: I'm so afraid I did something to it.     Currently in Pain?  Yes    Pain Score  6     Pain Location  Shoulder    Pain Orientation  Right    Pain Descriptors / Indicators  Aching;Sore    Pain Type  Acute pain         [facility] OT Assessment - 02/19/18 1505      Assessment   Medical Diagnosis  s/p right arthroscopic RCR      Precautions   Precautions  Shoulder    Type of Shoulder Precautions  See protocol: P/ROM weeks 0-5 (1/24-2/28); Week 5 (2/28-3/8): Initiate AA/ROM; weeks 6-8 (3/11-3/29): progress to A/ROM. Weeks 10-16 (4/4): initiate strengthening and prone exercises               OT Treatments/Exercises (OP) - 02/19/18 1504  Exercises   Exercises  Shoulder      Shoulder Exercises: Supine   Protraction  PROM;5 reps;AROM;12 reps    Horizontal ABduction  PROM;5 reps;AROM;12 reps    External Rotation  PROM;5 reps;AROM;12 reps    Internal Rotation  PROM;5 reps;AROM;12 reps    Flexion  PROM;5 reps;AROM;12 reps    ABduction  PROM;5 reps;AROM;12 reps      Shoulder Exercises: Seated   Protraction  AROM;12 reps    Horizontal ABduction  AROM;12 reps    External Rotation  AROM;12 reps    Internal Rotation  AROM;12 reps    Flexion  AROM;12 reps    Abduction  AROM;12 reps      Shoulder Exercises: ROM/Strengthening   X to V Arms  12X    Proximal Shoulder Strengthening, Supine  12X no rest breaks    Proximal Shoulder Strengthening, Seated  12X no rest breaks    Ball on Wall  1' flexion green ball      Manual Therapy   Manual Therapy  Myofascial release    Manual therapy comments  manual therapy  intervention completed seperately from all other interventions this date of service.      Myofascial Release  Myofasical release and manual stretching to right upper arm, scapular, and shoulder region to decrease pain and restrictions and improve pain free mobiilty in right shoulder.                OT Short Term Goals - 02/19/18 1518      OT SHORT TERM GOAL #1   Title  Patient will be educated on a HEP for improved right shoulder mobility and strength.     Time  4    Period  Weeks    Status  On-going      OT SHORT TERM GOAL #2   Title  Pt will improve P/ROM of RUE to WNL to improve ability to use RUE as gross assist with donning LB clothing    Time  4    Period  Weeks      OT SHORT TERM GOAL #3   Title  Pt will decrease pain in RUE to 5/10 to improve ability to sleep.     Time  4    Period  Weeks        OT Long Term Goals - 02/19/18 1518      OT LONG TERM GOAL #1   Title  Patient will return to highest level of independence with all B/IADLSs, work, and leisure activities using right arm as dominant.     Time  8    Period  Weeks    Status  On-going      OT LONG TERM GOAL #2   Title  Patient will improve right shoulder A/ROM to WNL for improved ability to complete overhead reaching tasks during ADL completion.     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    Time  8    Period  Weeks    Status  On-going      OT LONG TERM GOAL #4   Title  Patient will decrease pain in her right shoulder to 2/10 for improved ability to ADLs using RUE as dominant.     Time  8    Period  Weeks  Status  On-going      OT LONG TERM GOAL #5   Title  Patient will decrease fasical restrictions to minimal amounts or less in her RUE for greater mobility needed for daily and work tasks.    Time  8    Period  Weeks    Status  On-going            Plan -  02/19/18 1515    Clinical Impression Statement  A: Did not complete all planned exercises due to time constraint. Patient was able to tolerate an increase in repetitions for A/ROM supine and seated. VC for form and technique.    Plan  P: Complete FOTO. Scapular theraband to HEP and abduction for ball on the wall, overhead lacing.     Consulted and Agree with Plan of Care  Patient       Patient will benefit from skilled therapeutic intervention in order to improve the following deficits and impairments:  Decreased strength, Decreased activity tolerance, Pain, Decreased range of motion, Increased fascial restrictions, Impaired UE functional use  Visit Diagnosis: Acute pain of right shoulder  Other symptoms and signs involving the musculoskeletal system  Stiffness of right shoulder, not elsewhere classified    Problem List Patient Active Problem List   Diagnosis Date Noted  . Radiculopathy 11/02/2016  . Left leg pain 11/22/2014  . Fibromyalgia 01/30/2012  . Depression with anxiety 01/30/2012  . Lumbar post-laminectomy syndrome 01/30/2012  . Thoracic or lumbosacral neuritis or radiculitis, unspecified 01/30/2012   Limmie Patricia, OTR/L,CBIS  [phone]  02/19/2018, 3:19 PM  [facility] [address] [place], [place], [address] Phone: [phone]   Fax:  [phone]  Name: DANIELLY ACKERLEY MRN: [id number] Date of Birth: [birth year]

## 2018-02-21 ENCOUNTER — Ambulatory Visit (HOSPITAL_COMMUNITY): Payer: BLUE CROSS/BLUE SHIELD | Admitting: Occupational Therapy

## 2018-02-21 ENCOUNTER — Encounter (HOSPITAL_COMMUNITY): Payer: Self-pay | Admitting: Occupational Therapy

## 2018-02-21 DIAGNOSIS — M25511 Pain in right shoulder: Secondary | ICD-10-CM | POA: Diagnosis not present

## 2018-02-21 DIAGNOSIS — R29898 Other symptoms and signs involving the musculoskeletal system: Secondary | ICD-10-CM

## 2018-02-21 DIAGNOSIS — M25611 Stiffness of right shoulder, not elsewhere classified: Secondary | ICD-10-CM | POA: Diagnosis not present

## 2018-02-21 NOTE — Patient Instructions (Signed)

## 2018-02-21 NOTE — Therapy (Signed)
[facility] [address] [place], [place], [address] Phone: [phone]   Fax:  [phone]  Occupational Therapy Treatment  Patient Details  Name: Melanie Avila MRN: [id number] Date of Birth: 1961-07-26 Referring Provider: Dr. Jones Broom   Encounter Date: 02/21/2018  OT End of Session - 02/21/18 1516    Visit Number  8    Number of Visits  16    Date for OT Re-Evaluation  03/18/18    Authorization Type  BCBS    Authorization Time Period  90 visits, 2 used    Authorization - Visit Number  10    Authorization - Number of Visits  90    OT Start Time  1427    OT Stop Time  1515    OT Time Calculation (min)  48 min    Activity Tolerance  Patient tolerated treatment well    Behavior During Therapy  WFL for tasks assessed/performed       Past Medical History:  Diagnosis Date  . Anemia   . Anxiety   . Complication of anesthesia    last shoulder surg 2010-dsc-2 hr after block to surg-neck swelled-hard to tube-stayed RCC  . DDD (degenerative disc disease)   . Depression   . Fibromyalgia   . GERD (gastroesophageal reflux disease)   . Headache    migraines in her younger years  . Heart murmur    since birth and never has been a problem  . History of kidney stones    7 times  . Hyperlipidemia   . Hypertension   . Neuromuscular disorder (HCC)    nerve damage left leg  . PONV (postoperative nausea and vomiting)    shorter surgeries tends to have n/v.     Past Surgical History:  Procedure Laterality Date  . BACK SURGERY  89,02   lumbar x2  . CARPAL TUNNEL RELEASE Bilateral   . COLONOSCOPY N/A 12/09/2015   Procedure: COLONOSCOPY;  Surgeon: Malissa Hippo, MD;  [facility];  Service: Endoscopy;  Laterality: N/A;  830  . SHOULDER ARTHROSCOPY WITH SUBACROMIAL DECOMPRESSION  10/23/2012   Procedure: SHOULDER ARTHROSCOPY WITH SUBACROMIAL DECOMPRESSION;  Surgeon: Wyn Forster., MD;  Location: [facility];  Service: Orthopedics;  Laterality: Left;  LEFT SHOULDER  ARTHROSCOPY WITH SUBACROMIAL DECOMPRESSION, DISTAL CLAVICLE RESECTION, ARTHROSCOPIC SUBSCAPULARIS REPAIR AND OPEN REPAIR OF SUPRASPINATOUS TENDON  . SHOULDER SURGERY Bilateral   . TUBAL LIGATION      There were no vitals filed for this visit.  Subjective Assessment - 02/21/18 1427    Subjective   S: I reached for the radio in the car and my arm just dropped.     Currently in Pain?  Yes    Pain Score  6     Pain Location  Shoulder    Pain Orientation  Right    Pain Descriptors / Indicators  Aching;Sore    Pain Type  Acute pain    Pain Radiating Towards  n/a    Pain Onset  More than a month ago    Pain Frequency  Constant    Aggravating Factors   use, movement, bra strap    Pain Relieving Factors  rest, heat, ice, pain medication    Effect of Pain on Daily Activities  mod effect on ADL completion    Multiple Pain Sites  No         [facility] OT Assessment - 02/21/18 1426  Assessment   Medical Diagnosis  s/p right arthroscopic RCR      Precautions   Precautions  Shoulder    Type of Shoulder Precautions  See protocol: P/ROM weeks 0-5 (1/24-2/28); Week 5 (2/28-3/8): Initiate AA/ROM; weeks 6-8 (3/11-3/29): progress to A/ROM. Weeks 10-16 (4/4): initiate strengthening and prone exercises               OT Treatments/Exercises (OP) - 02/21/18 1431      Exercises   Exercises  Shoulder      Shoulder Exercises: Supine   Protraction  PROM;5 reps;AROM;12 reps    Horizontal ABduction  PROM;5 reps;AROM;12 reps    External Rotation  PROM;5 reps;AROM;12 reps    Internal Rotation  PROM;5 reps;AROM;12 reps    Flexion  PROM;5 reps;AROM;12 reps    ABduction  PROM;5 reps;AROM;12 reps      Shoulder Exercises: Seated   Protraction  AROM;12 reps    Horizontal ABduction  AROM;12 reps    External Rotation  AROM;12 reps    Internal Rotation  AROM;12 reps    Flexion  AROM;12 reps    Abduction  AROM;12 reps       Shoulder Exercises: Standing   Extension  Theraband;10 reps    Theraband Level (Shoulder Extension)  Level 2 (Red)    Row  Theraband;10 reps    Theraband Level (Shoulder Row)  Level 2 (Red)    Retraction  Theraband;10 reps    Theraband Level (Shoulder Retraction)  Level 2 (Red)      Shoulder Exercises: ROM/Strengthening   "W" Arms  10X    X to V Arms  12X    Proximal Shoulder Strengthening, Supine  12X no rest breaks    Proximal Shoulder Strengthening, Seated  12X no rest breaks    Ball on Wall  1' flexion green ball      Shoulder Exercises: Stretch   Internal Rotation Stretch  2 reps 10" each      Manual Therapy   Manual Therapy  Myofascial release    Manual therapy comments  manual therapy intervention completed seperately from all other interventions this date of service.      Myofascial Release  Myofasical release and manual stretching to right upper arm, scapular, and shoulder region to decrease pain and restrictions and improve pain free mobiilty in right shoulder.              OT Education - 02/21/18 1455    Education provided  Yes    Education Details  scapular theraband-red    Person(s) Educated  Patient    Methods  Explanation;Demonstration;Handout    Comprehension  Verbalized understanding;Returned demonstration       OT Short Term Goals - 02/19/18 1518      OT SHORT TERM GOAL #1   Title  Patient will be educated on a HEP for improved right shoulder mobility and strength.     Time  4    Period  Weeks    Status  On-going      OT SHORT TERM GOAL #2   Title  Pt will improve P/ROM of RUE to WNL to improve ability to use RUE as gross assist with donning LB clothing    Time  4    Period  Weeks      OT SHORT TERM GOAL #3   Title  Pt will decrease pain in RUE to 5/10 to improve ability to sleep.     Time  4    Period  Weeks        OT Long Term Goals - 02/19/18 1518      OT LONG TERM GOAL #1   Title  Patient will return to highest level of  independence with all B/IADLSs, work, and leisure activities using right arm as dominant.     Time  8    Period  Weeks    Status  On-going      OT LONG TERM GOAL #2   Title  Patient will improve right shoulder A/ROM to WNL for improved ability to complete overhead reaching tasks during ADL completion.     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    Time  8    Period  Weeks    Status  On-going      OT LONG TERM GOAL #4   Title  Patient will decrease pain in her right shoulder to 2/10 for improved ability to ADLs using RUE as dominant.     Time  8    Period  Weeks    Status  On-going      OT LONG TERM GOAL #5   Title  Patient will decrease fasical restrictions to minimal amounts or less in her RUE for greater mobility needed for daily and work tasks.    Time  8    Period  Weeks    Status  On-going            Plan - 02/21/18 1516    Clinical Impression Statement  A: Continued with A/ROM and scapular strengthening, verbal cuing for form and technique. Pt with increased soreness today, has been completing a lot of housecleaning; continues to have difficulty sleeping. Updated HEP for scapular theraband.     Plan  P: Complete FOTO. Add overhead lacing, follow up on HEP.        Patient will benefit from skilled therapeutic intervention in order to improve the following deficits and impairments:  Decreased strength, Decreased activity tolerance, Pain, Decreased range of motion, Increased fascial restrictions, Impaired UE functional use  Visit Diagnosis: Acute pain of right shoulder  Other symptoms and signs involving the musculoskeletal system    Problem List Patient Active Problem List   Diagnosis Date Noted  . Radiculopathy 11/02/2016  . Left leg pain 11/22/2014  . Fibromyalgia 01/30/2012  . Depression with anxiety 01/30/2012  . Lumbar  post-laminectomy syndrome 01/30/2012  . Thoracic or lumbosacral neuritis or radiculitis, unspecified 01/30/2012   Ezra SitesLeslie Troxler, OTR/L  [phone] 02/21/2018, 3:18 PM  [facility] [address] [place], [place], [address] Phone: [phone]   Fax:  [phone]  Name: Melanie Avila MRN: [id number] Date of Birth: 02/16/1961

## 2018-02-26 ENCOUNTER — Encounter (HOSPITAL_COMMUNITY): Payer: BLUE CROSS/BLUE SHIELD

## 2018-02-28 ENCOUNTER — Ambulatory Visit (HOSPITAL_COMMUNITY): Payer: BLUE CROSS/BLUE SHIELD | Admitting: Occupational Therapy

## 2018-02-28 ENCOUNTER — Telehealth (HOSPITAL_COMMUNITY): Payer: Self-pay | Admitting: Occupational Therapy

## 2018-02-28 NOTE — Telephone Encounter (Signed)
She is sick today and can not come in °

## 2018-03-02 ENCOUNTER — Ambulatory Visit (HOSPITAL_COMMUNITY): Payer: BLUE CROSS/BLUE SHIELD | Admitting: Occupational Therapy

## 2018-03-02 ENCOUNTER — Encounter (HOSPITAL_COMMUNITY): Payer: Self-pay | Admitting: Occupational Therapy

## 2018-03-02 DIAGNOSIS — Z9889 Other specified postprocedural states: Secondary | ICD-10-CM | POA: Diagnosis not present

## 2018-03-02 DIAGNOSIS — M25511 Pain in right shoulder: Secondary | ICD-10-CM

## 2018-03-02 DIAGNOSIS — R29898 Other symptoms and signs involving the musculoskeletal system: Secondary | ICD-10-CM | POA: Diagnosis not present

## 2018-03-02 DIAGNOSIS — M25611 Stiffness of right shoulder, not elsewhere classified: Secondary | ICD-10-CM | POA: Diagnosis not present

## 2018-03-02 NOTE — Therapy (Signed)
[facility] [address] [place], [place], [address] Phone: [phone]   Fax:  [phone]  Occupational Therapy Treatment  Patient Details  Name: Melanie Avila MRN: [id number] Date of Birth: 05/21/1961 Referring Provider: Dr. Jones Broom   Encounter Date: 03/02/2018  OT End of Session - 03/02/18 1652    Visit Number  9    Number of Visits  16    Date for OT Re-Evaluation  03/18/18    Authorization Type  BCBS    Authorization Time Period  90 visits, 2 used    Authorization - Visit Number  11    Authorization - Number of Visits  90    OT Start Time  1602    OT Stop Time  1642    OT Time Calculation (min)  40 min    Activity Tolerance  Patient tolerated treatment well    Behavior During Therapy  [facility] for tasks assessed/performed       Past Medical History:  Diagnosis Date  . Anemia   . Anxiety   . Complication of anesthesia    last shoulder surg 2010-dsc-2 hr after block to surg-neck swelled-hard to tube-stayed RCC  . DDD (degenerative disc disease)   . Depression   . Fibromyalgia   . GERD (gastroesophageal reflux disease)   . Headache    migraines in her younger years  . Heart murmur    since birth and never has been a problem  . History of kidney stones    7 times  . Hyperlipidemia   . Hypertension   . Neuromuscular disorder (HCC)    nerve damage left leg  . PONV (postoperative nausea and vomiting)    shorter surgeries tends to have n/v.     Past Surgical History:  Procedure Laterality Date  . BACK SURGERY  89,02   lumbar x2  . CARPAL TUNNEL RELEASE Bilateral   . COLONOSCOPY N/A 12/09/2015   Procedure: COLONOSCOPY;  Surgeon: Malissa Hippo, MD;  [facility];  Service: Endoscopy;  Laterality: N/A;  830  . SHOULDER ARTHROSCOPY WITH SUBACROMIAL DECOMPRESSION  10/23/2012   Procedure: SHOULDER ARTHROSCOPY WITH SUBACROMIAL DECOMPRESSION;  Surgeon: Wyn Forster., MD;  Location: [facility];  Service: Orthopedics;  Laterality: Left;  LEFT SHOULDER  ARTHROSCOPY WITH SUBACROMIAL DECOMPRESSION, DISTAL CLAVICLE RESECTION, ARTHROSCOPIC SUBSCAPULARIS REPAIR AND OPEN REPAIR OF SUPRASPINATOUS TENDON  . SHOULDER SURGERY Bilateral   . TUBAL LIGATION      There were no vitals filed for this visit.  Subjective Assessment - 03/02/18 1603    Subjective   S: The doctor thinks I just inflamed it.     Currently in Pain?  Yes    Pain Score  5     Pain Location  Shoulder    Pain Orientation  Right    Pain Descriptors / Indicators  Aching;Sore    Pain Type  Acute pain    Pain Radiating Towards  n/a    Pain Onset  More than a month ago    Pain Frequency  Constant    Aggravating Factors   use, movement, bra strap    Pain Relieving Factors  rest, heat, ice, pain medication    Effect of Pain on Daily Activities  mod effect on ADL completion    Multiple Pain Sites  No         [facility] OT Assessment - 03/02/18 1603      Assessment   Medical  Diagnosis  s/p right arthroscopic RCR      Precautions   Precautions  Shoulder    Type of Shoulder Precautions  See protocol: P/ROM weeks 0-5 (1/24-2/28); Week 5 (2/28-3/8): Initiate AA/ROM; weeks 6-8 (3/11-3/29): progress to A/ROM. Weeks 10-16 (4/4): initiate strengthening and prone exercises      Observation/Other Assessments   Focus on Therapeutic Outcomes (FOTO)   50/100 26 previous               OT Treatments/Exercises (OP) - 03/02/18 1606      Exercises   Exercises  Shoulder      Shoulder Exercises: Supine   Protraction  PROM;5 reps;AROM;12 reps    Horizontal ABduction  PROM;5 reps;AROM;12 reps    External Rotation  PROM;5 reps;AROM;12 reps    Internal Rotation  PROM;5 reps;AROM;12 reps    Flexion  PROM;5 reps;AROM;12 reps    ABduction  PROM;5 reps;AROM;12 reps      Shoulder Exercises: Seated   Protraction  AROM;12 reps    Horizontal ABduction  AROM;12 reps    External Rotation  AROM;12 reps    Internal Rotation   AROM;12 reps    Flexion  AROM;12 reps    Abduction  AROM;12 reps      Shoulder Exercises: Standing   Extension  Strengthening;12 reps    Theraband Level (Shoulder Extension)  Level 2 (Red)    Row  Theraband;12 reps    Theraband Level (Shoulder Row)  Level 2 (Red)    Retraction  Theraband;12 reps    Theraband Level (Shoulder Retraction)  Level 2 (Red)      Shoulder Exercises: ROM/Strengthening   Over Head Lace  1'    "W" Arms  10X    X to V Arms  12X    Proximal Shoulder Strengthening, Supine  12X no rest breaks    Proximal Shoulder Strengthening, Seated  12X no rest breaks    Ball on Wall  1' flexion 1' abduction green ball      Manual Therapy   Manual Therapy  Myofascial release    Manual therapy comments  manual therapy intervention completed seperately from all other interventions this date of service.      Myofascial Release  Myofasical release and manual stretching to right upper arm, scapular, and shoulder region to decrease pain and restrictions and improve pain free mobiilty in right shoulder.                OT Short Term Goals - 02/19/18 1518      OT SHORT TERM GOAL #1   Title  Patient will be educated on a HEP for improved right shoulder mobility and strength.     Time  4    Period  Weeks    Status  On-going      OT SHORT TERM GOAL #2   Title  Pt will improve P/ROM of RUE to WNL to improve ability to use RUE as gross assist with donning LB clothing    Time  4    Period  Weeks      OT SHORT TERM GOAL #3   Title  Pt will decrease pain in RUE to 5/10 to improve ability to sleep.     Time  4    Period  Weeks        OT Long Term Goals - 02/19/18 1518      OT LONG TERM GOAL #1   Title  Patient will return to highest level of independence  with all B/IADLSs, work, and leisure activities using right arm as dominant.     Time  8    Period  Weeks    Status  On-going      OT LONG TERM GOAL #2   Title  Patient will improve right shoulder A/ROM to WNL for  improved ability to complete overhead reaching tasks during ADL completion.     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    Time  8    Period  Weeks    Status  On-going      OT LONG TERM GOAL #4   Title  Patient will decrease pain in her right shoulder to 2/10 for improved ability to ADLs using RUE as dominant.     Time  8    Period  Weeks    Status  On-going      OT LONG TERM GOAL #5   Title  Patient will decrease fasical restrictions to minimal amounts or less in her RUE for greater mobility needed for daily and work tasks.    Time  8    Period  Weeks    Status  On-going            Plan - 03/02/18 1653    Clinical Impression Statement  A: Continued with A/ROM and scapular strengthening required for overhead tasks, added overhead lacing and abduction ball on wall. Pt reports her shoulder still "drops" at times when she reaches mid-range during functional reaching. Also reports HEP is going well, however did not complete when she was sick. Verbal cuing during session for form and technique.     Plan  P: Continue with A/ROM and sustained functional use. Add UBE. Pt needs to make additional appts       Patient will benefit from skilled therapeutic intervention in order to improve the following deficits and impairments:  Decreased strength, Decreased activity tolerance, Pain, Decreased range of motion, Increased fascial restrictions, Impaired UE functional use  Visit Diagnosis: Acute pain of right shoulder  Other symptoms and signs involving the musculoskeletal system  Stiffness of right shoulder, not elsewhere classified    Problem List Patient Active Problem List   Diagnosis Date Noted  . Radiculopathy 11/02/2016  . Left leg pain 11/22/2014  . Fibromyalgia 01/30/2012  . Depression with anxiety 01/30/2012  . Lumbar post-laminectomy syndrome  01/30/2012  . Thoracic or lumbosacral neuritis or radiculitis, unspecified 01/30/2012   Ezra SitesLeslie Alegandro Macnaughton, OTR/L  [phone] 03/02/2018, 4:55 PM  [facility] [address] [place], [place], [address] Phone: [phone]   Fax:  [phone]  Name: Melanie Avila MRN: [id number] Date of Birth: 10/03/1961

## 2018-03-05 ENCOUNTER — Other Ambulatory Visit: Payer: Self-pay

## 2018-03-05 ENCOUNTER — Encounter (HOSPITAL_COMMUNITY): Payer: Self-pay

## 2018-03-05 ENCOUNTER — Ambulatory Visit (HOSPITAL_COMMUNITY): Payer: BLUE CROSS/BLUE SHIELD | Attending: Orthopedic Surgery

## 2018-03-05 DIAGNOSIS — R29898 Other symptoms and signs involving the musculoskeletal system: Secondary | ICD-10-CM | POA: Insufficient documentation

## 2018-03-05 DIAGNOSIS — M25611 Stiffness of right shoulder, not elsewhere classified: Secondary | ICD-10-CM | POA: Diagnosis not present

## 2018-03-05 DIAGNOSIS — M25511 Pain in right shoulder: Secondary | ICD-10-CM | POA: Diagnosis not present

## 2018-03-05 NOTE — Therapy (Signed)
[facility] [address] [place], [place], [address] Phone: [phone]   Fax:  [phone]  Occupational Therapy Treatment  Patient Details  Name: Melanie Avila MRN: [id number] Date of Birth: 10/30/1961 Referring Provider: Dr. Jones Broom   Encounter Date: 03/05/2018  OT End of Session - 03/05/18 1505    Visit Number  10    Number of Visits  16    Date for OT Re-Evaluation  03/18/18    Authorization Type  BCBS    Authorization Time Period  90 visits, 2 used    Authorization - Visit Number  12    Authorization - Number of Visits  90    OT Start Time  1436    OT Stop Time  1515    OT Time Calculation (min)  39 min    Activity Tolerance  Patient tolerated treatment well    Behavior During Therapy  [facility]FL for tasks assessed/performed       Past Medical History:  Diagnosis Date  . Anemia   . Anxiety   . Complication of anesthesia    last shoulder surg 2010-dsc-2 hr after block to surg-neck swelled-hard to tube-stayed RCC  . DDD (degenerative disc disease)   . Depression   . Fibromyalgia   . GERD (gastroesophageal reflux disease)   . Headache    migraines in her younger years  . Heart murmur    since birth and never has been a problem  . History of kidney stones    7 times  . Hyperlipidemia   . Hypertension   . Neuromuscular disorder (HCC)    nerve damage left leg  . PONV (postoperative nausea and vomiting)    shorter surgeries tends to have n/v.     Past Surgical History:  Procedure Laterality Date  . BACK SURGERY  89,02   lumbar x2  . CARPAL TUNNEL RELEASE Bilateral   . COLONOSCOPY N/A 12/09/2015   Procedure: COLONOSCOPY;  Surgeon: Malissa Hippo, MD;  [facility];  Service: Endoscopy;  Laterality: N/A;  830  . SHOULDER ARTHROSCOPY WITH SUBACROMIAL DECOMPRESSION  10/23/2012   Procedure: SHOULDER ARTHROSCOPY WITH SUBACROMIAL DECOMPRESSION;  Surgeon: Wyn Forster., MD;  Location: [facility];  Service: Orthopedics;  Laterality: Left;  LEFT SHOULDER  ARTHROSCOPY WITH SUBACROMIAL DECOMPRESSION, DISTAL CLAVICLE RESECTION, ARTHROSCOPIC SUBSCAPULARIS REPAIR AND OPEN REPAIR OF SUPRASPINATOUS TENDON  . SHOULDER SURGERY Bilateral   . TUBAL LIGATION      There were no vitals filed for this visit.  Subjective Assessment - 03/05/18 1505    Subjective   S: I'm glad I didn't do anything bad to it.     Currently in Pain?  Yes    Pain Score  6     Pain Orientation  Right    Pain Descriptors / Indicators  Aching;Sore    Pain Type  Acute pain         [facility] OT Assessment - 03/05/18 1456      Assessment   Medical Diagnosis  s/p right arthroscopic RCR      Precautions   Precautions  Shoulder    Type of Shoulder Precautions  See protocol: P/ROM weeks 0-5 (1/24-2/28); Week 5 (2/28-3/8): Initiate AA/ROM; weeks 6-8 (3/11-3/29): progress to A/ROM. Weeks 10-16 (4/4): initiate strengthening and prone exercises               OT Treatments/Exercises (OP) - 03/05/18 1457      Exercises  Exercises  Shoulder      Shoulder Exercises: Supine   Protraction  PROM;5 reps;AROM;15 reps    Horizontal ABduction  PROM;5 reps;AROM;15 reps    External Rotation  PROM;5 reps;AROM;15 reps    Internal Rotation  PROM;5 reps;AROM;15 reps    Flexion  PROM;5 reps;AROM;15 reps    ABduction  PROM;5 reps;AROM;15 reps      Shoulder Exercises: Sidelying   External Rotation  AROM;15 reps    Internal Rotation  AROM;15 reps    Flexion  PROM;15 reps    ABduction  AROM;15 reps    Other Sidelying Exercises  Protraction; 15X; A/ROM    Other Sidelying Exercises  Horizontal abduction; 15X; A/ROM      Shoulder Exercises: ROM/Strengthening   UBE (Upper Arm Bike)  Level 1 2' forward 2' reverse pace: 3.5-4.0    "W" Arms  15X    X to V Arms  15X    Proximal Shoulder Strengthening, Supine  15X no rest breaks      Manual Therapy   Manual Therapy  Myofascial release    Manual therapy comments  manual  therapy intervention completed seperately from all other interventions this date of service.      Myofascial Release  Myofasical release and manual stretching to right upper arm, scapular, and shoulder region to decrease pain and restrictions and improve pain free mobiilty in right shoulder.                OT Short Term Goals - 02/19/18 1518      OT SHORT TERM GOAL #1   Title  Patient will be educated on a HEP for improved right shoulder mobility and strength.     Time  4    Period  Weeks    Status  On-going      OT SHORT TERM GOAL #2   Title  Pt will improve P/ROM of RUE to WNL to improve ability to use RUE as gross assist with donning LB clothing    Time  4    Period  Weeks      OT SHORT TERM GOAL #3   Title  Pt will decrease pain in RUE to 5/10 to improve ability to sleep.     Time  4    Period  Weeks        OT Long Term Goals - 02/19/18 1518      OT LONG TERM GOAL #1   Title  Patient will return to highest level of independence with all B/IADLSs, work, and leisure activities using right arm as dominant.     Time  8    Period  Weeks    Status  On-going      OT LONG TERM GOAL #2   Title  Patient will improve right shoulder A/ROM to WNL for improved ability to complete overhead reaching tasks during ADL completion.     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    Time  8    Period  Weeks    Status  On-going      OT LONG TERM GOAL #4   Title  Patient will decrease pain in her right shoulder to 2/10 for improved ability to ADLs using RUE as dominant.     Time  8  Period  Weeks    Status  On-going      OT LONG TERM GOAL #5   Title  Patient will decrease fasical restrictions to minimal amounts or less in her RUE for greater mobility needed for daily and work tasks.    Time  8    Period  Weeks    Status  On-going             Plan - 03/05/18 1511    Clinical Impression Statement  A: Week 9 post op. patient was able to increase repetitions to 15. Added sidelying A/ROM and UBE bike. VC for form and technique. Patient has full P/ROM with some tightness noted with external rotation with arm abducted.     Plan  P: Continue with protocol. Continue with scapular theraband to increase scapular strengthening. Add overhead lacing.     Consulted and Agree with Plan of Care  Patient       Patient will benefit from skilled therapeutic intervention in order to improve the following deficits and impairments:  Decreased strength, Decreased activity tolerance, Pain, Decreased range of motion, Increased fascial restrictions, Impaired UE functional use  Visit Diagnosis: Acute pain of right shoulder  Other symptoms and signs involving the musculoskeletal system  Stiffness of right shoulder, not elsewhere classified    Problem List Patient Active Problem List   Diagnosis Date Noted  . Radiculopathy 11/02/2016  . Left leg pain 11/22/2014  . Fibromyalgia 01/30/2012  . Depression with anxiety 01/30/2012  . Lumbar post-laminectomy syndrome 01/30/2012  . Thoracic or lumbosacral neuritis or radiculitis, unspecified 01/30/2012   Limmie PatriciaLaura Essenmacher, OTR/L,CBIS  [phone]  03/05/2018, 3:14 PM  [facility] [address] [place], [place], [address] Phone: [phone]   Fax:  [phone]  Name: Melanie Avila MRN: [id number] Date of Birth: 12/28/1960

## 2018-03-06 DIAGNOSIS — E782 Mixed hyperlipidemia: Secondary | ICD-10-CM | POA: Diagnosis not present

## 2018-03-06 DIAGNOSIS — F411 Generalized anxiety disorder: Secondary | ICD-10-CM | POA: Diagnosis not present

## 2018-03-06 DIAGNOSIS — M5416 Radiculopathy, lumbar region: Secondary | ICD-10-CM | POA: Diagnosis not present

## 2018-03-06 DIAGNOSIS — R7301 Impaired fasting glucose: Secondary | ICD-10-CM | POA: Diagnosis not present

## 2018-03-06 DIAGNOSIS — M792 Neuralgia and neuritis, unspecified: Secondary | ICD-10-CM | POA: Diagnosis not present

## 2018-03-06 DIAGNOSIS — G894 Chronic pain syndrome: Secondary | ICD-10-CM | POA: Diagnosis not present

## 2018-03-06 DIAGNOSIS — D72829 Elevated white blood cell count, unspecified: Secondary | ICD-10-CM | POA: Diagnosis not present

## 2018-03-06 DIAGNOSIS — I10 Essential (primary) hypertension: Secondary | ICD-10-CM | POA: Diagnosis not present

## 2018-03-07 ENCOUNTER — Encounter (HOSPITAL_COMMUNITY): Payer: BLUE CROSS/BLUE SHIELD | Admitting: Occupational Therapy

## 2018-03-08 DIAGNOSIS — E782 Mixed hyperlipidemia: Secondary | ICD-10-CM | POA: Diagnosis not present

## 2018-03-08 DIAGNOSIS — Z79891 Long term (current) use of opiate analgesic: Secondary | ICD-10-CM | POA: Diagnosis not present

## 2018-03-08 DIAGNOSIS — Z79899 Other long term (current) drug therapy: Secondary | ICD-10-CM | POA: Diagnosis not present

## 2018-03-08 DIAGNOSIS — G894 Chronic pain syndrome: Secondary | ICD-10-CM | POA: Diagnosis not present

## 2018-03-08 DIAGNOSIS — R7301 Impaired fasting glucose: Secondary | ICD-10-CM | POA: Diagnosis not present

## 2018-03-08 DIAGNOSIS — M79643 Pain in unspecified hand: Secondary | ICD-10-CM | POA: Diagnosis not present

## 2018-03-08 DIAGNOSIS — I10 Essential (primary) hypertension: Secondary | ICD-10-CM | POA: Diagnosis not present

## 2018-03-14 ENCOUNTER — Ambulatory Visit (HOSPITAL_COMMUNITY): Payer: BLUE CROSS/BLUE SHIELD

## 2018-03-14 ENCOUNTER — Encounter (HOSPITAL_COMMUNITY): Payer: Self-pay

## 2018-03-14 ENCOUNTER — Other Ambulatory Visit: Payer: Self-pay

## 2018-03-14 DIAGNOSIS — M25611 Stiffness of right shoulder, not elsewhere classified: Secondary | ICD-10-CM

## 2018-03-14 DIAGNOSIS — R29898 Other symptoms and signs involving the musculoskeletal system: Secondary | ICD-10-CM | POA: Diagnosis not present

## 2018-03-14 DIAGNOSIS — M25511 Pain in right shoulder: Secondary | ICD-10-CM

## 2018-03-14 NOTE — Therapy (Signed)
[facility] [address] [place], [place], [address] Phone: [phone]   Fax:  [phone]  Occupational Therapy Treatment  Patient Details  Name: Melanie Avila MRN: [id number] Date of Birth: 01-Aug-1961 Referring Provider: Dr. Jones Broom   Encounter Date: 03/14/2018  OT End of Session - 03/14/18 1011    Visit Number  11    Number of Visits  16    Date for OT Re-Evaluation  03/18/18    Authorization Type  BCBS    Authorization Time Period  90 visits, 2 used    Authorization - Visit Number  13    Authorization - Number of Visits  90    OT Start Time  0950    OT Stop Time  1030    OT Time Calculation (min)  40 min    Activity Tolerance  Patient tolerated treatment well    Behavior During Therapy  [facility] for tasks assessed/performed       Past Medical History:  Diagnosis Date  . Anemia   . Anxiety   . Complication of anesthesia    last shoulder surg 2010-dsc-2 hr after block to surg-neck swelled-hard to tube-stayed RCC  . DDD (degenerative disc disease)   . Depression   . Fibromyalgia   . GERD (gastroesophageal reflux disease)   . Headache    migraines in her younger years  . Heart murmur    since birth and never has been a problem  . History of kidney stones    7 times  . Hyperlipidemia   . Hypertension   . Neuromuscular disorder (HCC)    nerve damage left leg  . PONV (postoperative nausea and vomiting)    shorter surgeries tends to have n/v.     Past Surgical History:  Procedure Laterality Date  . BACK SURGERY  89,02   lumbar x2  . CARPAL TUNNEL RELEASE Bilateral   . COLONOSCOPY N/A 12/09/2015   Procedure: COLONOSCOPY;  Surgeon: Malissa Hippo, MD;  [facility];  Service: Endoscopy;  Laterality: N/A;  830  . SHOULDER ARTHROSCOPY WITH SUBACROMIAL DECOMPRESSION  10/23/2012   Procedure: SHOULDER ARTHROSCOPY WITH SUBACROMIAL DECOMPRESSION;  Surgeon: Wyn Forster., MD;  Location: [facility];  Service: Orthopedics;  Laterality: Left;  LEFT SHOULDER  ARTHROSCOPY WITH SUBACROMIAL DECOMPRESSION, DISTAL CLAVICLE RESECTION, ARTHROSCOPIC SUBSCAPULARIS REPAIR AND OPEN REPAIR OF SUPRASPINATOUS TENDON  . SHOULDER SURGERY Bilateral   . TUBAL LIGATION      There were no vitals filed for this visit.  Subjective Assessment - 03/14/18 1007    Currently in Pain?  Yes    Pain Score  4     Pain Location  Shoulder    Pain Orientation  Right    Pain Descriptors / Indicators  Sore    Pain Type  Acute pain    Pain Radiating Towards  N/A    Pain Onset  More than a month ago    Pain Frequency  Intermittent    Aggravating Factors   use, certain movements, bra strap    Pain Relieving Factors  rest, heat, ice, pain medication    Effect of Pain on Daily Activities  Min effect         [facility] OT Assessment - 03/14/18 1009      Assessment   Medical Diagnosis  s/p right arthroscopic RCR      Precautions   Precautions  Shoulder    Type of Shoulder Precautions  See protocol: P/ROM weeks 0-5 (1/24-2/28); Week 5 (2/28-3/8): Initiate AA/ROM; weeks 6-8 (3/11-3/29): progress to A/ROM. Weeks 10-16 (4/4): initiate strengthening and prone exercises               OT Treatments/Exercises (OP) - 03/14/18 1009      Exercises   Exercises  Shoulder      Shoulder Exercises: Supine   Protraction  PROM;5 reps;Strengthening;12 reps    Protraction Weight (lbs)  2    Horizontal ABduction  PROM;5 reps;Strengthening;12 reps    Horizontal ABduction Weight (lbs)  2    External Rotation  PROM;5 reps;Strengthening;12 reps    External Rotation Weight (lbs)  2    Internal Rotation  PROM;5 reps;Strengthening;12 reps    Internal Rotation Weight (lbs)  2    Flexion  PROM;5 reps;Strengthening;12 reps    Shoulder Flexion Weight (lbs)  2    ABduction  PROM;5 reps;Strengthening;12 reps    Shoulder ABduction Weight (lbs)  1      Shoulder Exercises: Seated   Protraction  Strengthening;10 reps     Protraction Weight (lbs)  2    Horizontal ABduction  Strengthening;10 reps    Horizontal ABduction Weight (lbs)  2    External Rotation  Strengthening;10 reps    External Rotation Weight (lbs)  2    Internal Rotation  Strengthening;10 reps    Internal Rotation Weight (lbs)  2    Flexion  Strengthening;10 reps    Flexion Weight (lbs)  2    Abduction  Strengthening;10 reps    ABduction Weight (lbs)  2      Shoulder Exercises: ROM/Strengthening   UBE (Upper Arm Bike)  Level 1 2' forward 2' reverse pace: 3.5-4.0    X to V Arms  10X with 1#    Proximal Shoulder Strengthening, Supine  12X with 2# no rest breaks      Manual Therapy   Manual Therapy  Myofascial release    Manual therapy comments  manual therapy intervention completed seperately from all other interventions this date of service.      Myofascial Release  Myofasical release and manual stretching to right upper arm, scapular, and shoulder region to decrease pain and restrictions and improve pain free mobiilty in right shoulder.                OT Short Term Goals - 02/19/18 1518      OT SHORT TERM GOAL #1   Title  Patient will be educated on a HEP for improved right shoulder mobility and strength.     Time  4    Period  Weeks    Status  On-going      OT SHORT TERM GOAL #2   Title  Pt will improve P/ROM of RUE to WNL to improve ability to use RUE as gross assist with donning LB clothing    Time  4    Period  Weeks      OT SHORT TERM GOAL #3   Title  Pt will decrease pain in RUE to 5/10 to improve ability to sleep.     Time  4    Period  Weeks        OT Long Term Goals - 02/19/18 1518      OT LONG TERM GOAL #1   Title  Patient will return to highest level of independence with all B/IADLSs, work, and leisure activities using right arm as dominant.     Time  8  Period  Weeks    Status  On-going      OT LONG TERM GOAL #2   Title  Patient will improve right shoulder A/ROM to WNL for improved ability to  complete overhead reaching tasks during ADL completion.     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    Time  8    Period  Weeks    Status  On-going      OT LONG TERM GOAL #4   Title  Patient will decrease pain in her right shoulder to 2/10 for improved ability to ADLs using RUE as dominant.     Time  8    Period  Weeks    Status  On-going      OT LONG TERM GOAL #5   Title  Patient will decrease fasical restrictions to minimal amounts or less in her RUE for greater mobility needed for daily and work tasks.    Time  8    Period  Weeks    Status  On-going            Plan - 03/14/18 1011    Clinical Impression Statement  A: No fascial restrictions noted in RUE this session. Patient has full passive range of motion. Progressed to strengthening with 2# and 1# handweights. Pt required VC for form and technique.     Plan  P: D/C passive stretching and myofascial release. Add overhead lacing. Continue with scapular theraband for scapular strengthening.        Patient will benefit from skilled therapeutic intervention in order to improve the following deficits and impairments:  Decreased strength, Decreased activity tolerance, Pain, Decreased range of motion, Increased fascial restrictions, Impaired UE functional use  Visit Diagnosis: Acute pain of right shoulder  Other symptoms and signs involving the musculoskeletal system  Stiffness of right shoulder, not elsewhere classified    Problem List Patient Active Problem List   Diagnosis Date Noted  . Radiculopathy 11/02/2016  . Left leg pain 11/22/2014  . Fibromyalgia 01/30/2012  . Depression with anxiety 01/30/2012  . Lumbar post-laminectomy syndrome 01/30/2012  . Thoracic or lumbosacral neuritis or radiculitis, unspecified 01/30/2012   Limmie PatriciaLaura Talib Headley, OTR/L,CBIS  [phone]  03/14/2018,  10:25 AM  [facility] [address] [place], [place], [address] Phone: [phone]   Fax:  [phone]  Name: Melanie Avila MRN: [id number] Date of Birth: 12/05/1960

## 2018-03-20 ENCOUNTER — Ambulatory Visit (HOSPITAL_COMMUNITY): Payer: BLUE CROSS/BLUE SHIELD

## 2018-03-20 DIAGNOSIS — M25511 Pain in right shoulder: Secondary | ICD-10-CM | POA: Diagnosis not present

## 2018-03-20 DIAGNOSIS — R29898 Other symptoms and signs involving the musculoskeletal system: Secondary | ICD-10-CM

## 2018-03-20 DIAGNOSIS — M25611 Stiffness of right shoulder, not elsewhere classified: Secondary | ICD-10-CM | POA: Diagnosis not present

## 2018-03-20 NOTE — Therapy (Signed)
[place], [place], [address] Phone: [phone]   Fax:  [phone]  Occupational Therapy Treatment  Patient Details  Name: Melanie Avila MRN: [id number] Date of Birth: [birth year] Referring Provider: Dr. Tania Ade   Encounter Date: 03/20/2018  OT End of Session - 03/20/18 1017    Visit Number  12    Number of Visits  16    Date for OT Re-Evaluation  03/18/18    Authorization Type  BCBS    Authorization Time Period  90 visits, 2 used    Authorization - Visit Number  14    Authorization - Number of Visits  42    OT Start Time  [phone] reassessment and D/C    OT Stop Time  1030    OT Time Calculation (min)  37 min    Activity Tolerance  Patient tolerated treatment well    Behavior During Therapy  [facility] for tasks assessed/performed       Past Medical History:  Diagnosis Date  . Anemia   . Anxiety   . Complication of anesthesia    last shoulder surg 2010-dsc-2 hr after block to surg-neck swelled-hard to tube-stayed RCC  . DDD (degenerative disc disease)   . Depression   . Fibromyalgia   . GERD (gastroesophageal reflux disease)   . Headache    migraines in her younger years  . Heart murmur    since birth and never has been a problem  . History of kidney stones    7 times  . Hyperlipidemia   . Hypertension   . Neuromuscular disorder ([place])    nerve damage left leg  . PONV (postoperative nausea and vomiting)    shorter surgeries tends to have n/v.     Past Surgical History:  Procedure Laterality Date  . BACK SURGERY  89,02   lumbar x2  . CARPAL TUNNEL RELEASE Bilateral   . COLONOSCOPY N/A 12/09/2015   Procedure: COLONOSCOPY;  Surgeon: Rogene Houston, MD;  [facility];  Service: Endoscopy;  Laterality: N/A;  830  . SHOULDER ARTHROSCOPY WITH SUBACROMIAL DECOMPRESSION  10/23/2012   Procedure: SHOULDER ARTHROSCOPY WITH SUBACROMIAL DECOMPRESSION;  Surgeon: Cammie Sickle., MD;  Location:  [place];  Service: Orthopedics;  Laterality: Left;  LEFT SHOULDER  ARTHROSCOPY WITH SUBACROMIAL DECOMPRESSION, DISTAL CLAVICLE RESECTION, ARTHROSCOPIC SUBSCAPULARIS REPAIR AND OPEN REPAIR OF SUPRASPINATOUS TENDON  . SHOULDER SURGERY Bilateral   . TUBAL LIGATION      There were no vitals filed for this visit.  Subjective Assessment - 03/20/18 1015    Subjective   S: I was able to clean my house the other day without problems.     Special Tests  FOTO score: 92/100    Currently in Pain?  No/denies         [facility] OT Assessment - 03/20/18 0955      Assessment   Medical Diagnosis  s/p right arthroscopic RCR      Precautions   Precautions  Shoulder    Type of Shoulder Precautions  See protocol: P/ROM weeks 0-5 (1/24-2/28); Week 5 (2/28-3/8): Initiate AA/ROM; weeks 6-8 (3/11-3/29): progress to A/ROM. Weeks 10-16 (4/4): initiate strengthening and prone exercises      Observation/Other Assessments   Focus on Therapeutic Outcomes (FOTO)   92/100      ROM / Strength   AROM / PROM / Strength  AROM;PROM;Strength      AROM   Overall AROM  Comments  assessed in seated, external and internal rotation with shoudler abducted to 90     AROM Assessment Site  Shoulder    Right/Left Shoulder  Right    Right Shoulder Flexion  175 Degrees previous: 170    Right Shoulder ABduction  180 Degrees previous: same    Right Shoulder Internal Rotation  55 Degrees previous: 44    Right Shoulder External Rotation  90 Degrees previous: same      PROM   Overall PROM   Within functional limits for tasks performed    Overall PROM Comments  Assessed supine, er/IR adducted      Strength   Overall Strength Comments  assessed in seated, external and internal rotation with shoulder abducted to 90     Strength Assessment Site  Shoulder    Right/Left Shoulder  Right    Right Shoulder Flexion  4+/5 previous: 4/5    Right Shoulder ABduction  5/5 previous: -/5    Right Shoulder Internal Rotation  5/5  previous: 4/5    Right Shoulder External Rotation  5/5 previous; 4/5               OT Treatments/Exercises (OP) - 03/20/18 1016      Exercises   Exercises  Shoulder      Shoulder Exercises: Standing   Horizontal ABduction  Theraband;12 reps    Theraband Level (Shoulder Horizontal ABduction)  Level 2 (Red)    External Rotation  Theraband;12 reps    Theraband Level (Shoulder External Rotation)  Level 2 (Red)    Internal Rotation  Theraband;12 reps    Theraband Level (Shoulder Internal Rotation)  Level 2 (Red)    Flexion  Theraband;12 reps    Theraband Level (Shoulder Flexion)  Level 2 (Red)    ABduction  Theraband;12 reps    Theraband Level (Shoulder ABduction)  Level 2 (Red)             OT Education - 03/20/18 1014    Education provided  Yes    Education Details  shoulder strengthening with red band    Person(s) Educated  Patient    Methods  Explanation;Demonstration;Handout    Comprehension  Returned demonstration;Verbalized understanding       OT Short Term Goals - 03/20/18 1001      OT SHORT TERM GOAL #1   Title  Patient will be educated on a HEP for improved right shoulder mobility and strength.     Time  4    Period  Weeks    Status  Achieved      OT SHORT TERM GOAL #2   Title  Pt will improve P/ROM of RUE to WNL to improve ability to use RUE as gross assist with donning LB clothing    Time  4    Period  Weeks      OT SHORT TERM GOAL #3   Title  Pt will decrease pain in RUE to 5/10 to improve ability to sleep.     Time  4    Period  Weeks        OT Long Term Goals - 03/20/18 1002      OT LONG TERM GOAL #1   Title  Patient will return to highest level of independence with all B/IADLSs, work, and leisure activities using right arm as dominant.     Time  8    Period  Weeks    Status  Achieved      OT LONG TERM  GOAL #2   Title  Patient will improve right shoulder A/ROM to WNL for improved ability to complete overhead reaching tasks during  ADL completion.     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    Baseline  90% met    Time  8    Period  Weeks    Status  Partially Met      OT LONG TERM GOAL #4   Title  Patient will decrease pain in her right shoulder to 2/10 for improved ability to ADLs using RUE as dominant.     Time  8    Period  Weeks    Status  Achieved      OT LONG TERM GOAL #5   Title  Patient will decrease fasical restrictions to minimal amounts or less in her RUE for greater mobility needed for daily and work tasks.    Time  8    Period  Weeks    Status  Achieved            Plan - 03/20/18 1017    Clinical Impression Statement  A: Reassessment completed this date. Patient has met all therapy goals with the exception of her strength of her strength goal which she has met partially. Patient continues to have some limitations with her strength especially with flexion. Pt has full passive and active ROM. Discharge with HEP was recommended and patient is in agreement.    Plan  P: D/C from therapy with HEP.    Consulted and Agree with Plan of Care  Patient       Patient will benefit from skilled therapeutic intervention in order to improve the following deficits and impairments:  Decreased strength, Decreased activity tolerance, Pain, Decreased range of motion, Increased fascial restrictions, Impaired UE functional use  Visit Diagnosis: Acute pain of right shoulder  Other symptoms and signs involving the musculoskeletal system  Stiffness of right shoulder, not elsewhere classified    Problem List Patient Active Problem List   Diagnosis Date Noted  . Radiculopathy 11/02/2016  . Left leg pain 11/22/2014  . Fibromyalgia 01/30/2012  . Depression with anxiety 01/30/2012  . Lumbar post-laminectomy syndrome 01/30/2012  . Thoracic or lumbosacral neuritis or radiculitis,  unspecified 01/30/2012    OCCUPATIONAL THERAPY DISCHARGE SUMMARY  Visits from Start of Care: 12  Current functional level related to goals / functional outcomes: See above   Remaining deficits: See above   Education / Equipment: See above Plan: Patient agrees to discharge.  Patient goals were met. Patient is being discharged due to meeting the stated rehab goals.  ?????          Ailene Ravel, OTR/L,CBIS  [phone]  03/20/2018, 10:32 AM  [facility] [address] [place], [place], [address] Phone: [phone]   Fax:  [phone]  Name: Melanie Avila MRN: [id number] Date of Birth: 05-24-1961

## 2018-03-20 NOTE — Patient Instructions (Signed)
Complete the following exercises once a day or every other day. Complete 12-15 repetitions.    Strengthening: Chest Pull - Resisted   Hold Theraband in front of body with hands about shoulder width a part. Pull band a part and back together slowly. Repeat ____ times. Complete ____ set(s) per session.. Repeat ____ session(s) per day.  http://orth.exer.us/926   Copyright  VHI. All rights reserved.   PNF Strengthening: Resisted   Standing with resistive band around each hand, bring right arm up and away, thumb back. Repeat ____ times per set. Do ____ sets per session. Do ____ sessions per day.      Resisted External Rotation: in Neutral - Bilateral   Sit or stand, tubing in both hands, elbows at sides, bent to 90, forearms forward. Pinch shoulder blades together and rotate forearms out. Keep elbows at sides. Repeat ____ times per set. Do ____ sets per session. Do ____ sessions per day.  http://orth.exer.us/966   Copyright  VHI. All rights reserved.   PNF Strengthening: Resisted   Standing, hold resistive band above head. Bring right arm down and out from side. Repeat ____ times per set. Do ____ sets per session. Do ____ sessions per day.  http://orth.exer.us/922   Copyright  VHI. All rights reserved.

## 2018-03-22 ENCOUNTER — Encounter (HOSPITAL_COMMUNITY): Payer: BLUE CROSS/BLUE SHIELD | Admitting: Occupational Therapy

## 2018-03-27 ENCOUNTER — Encounter (HOSPITAL_COMMUNITY): Payer: BLUE CROSS/BLUE SHIELD | Admitting: Occupational Therapy

## 2018-03-29 ENCOUNTER — Encounter (HOSPITAL_COMMUNITY): Payer: BLUE CROSS/BLUE SHIELD | Admitting: Occupational Therapy

## 2018-04-03 ENCOUNTER — Encounter (HOSPITAL_COMMUNITY): Payer: BLUE CROSS/BLUE SHIELD

## 2018-04-05 ENCOUNTER — Encounter (HOSPITAL_COMMUNITY): Payer: BLUE CROSS/BLUE SHIELD | Admitting: Occupational Therapy

## 2018-04-05 DIAGNOSIS — Z79899 Other long term (current) drug therapy: Secondary | ICD-10-CM | POA: Diagnosis not present

## 2018-04-05 DIAGNOSIS — G894 Chronic pain syndrome: Secondary | ICD-10-CM | POA: Diagnosis not present

## 2018-04-05 DIAGNOSIS — M47817 Spondylosis without myelopathy or radiculopathy, lumbosacral region: Secondary | ICD-10-CM | POA: Diagnosis not present

## 2018-04-05 DIAGNOSIS — M79606 Pain in leg, unspecified: Secondary | ICD-10-CM | POA: Diagnosis not present

## 2018-04-05 DIAGNOSIS — M5137 Other intervertebral disc degeneration, lumbosacral region: Secondary | ICD-10-CM | POA: Diagnosis not present

## 2018-04-05 DIAGNOSIS — Z79891 Long term (current) use of opiate analgesic: Secondary | ICD-10-CM | POA: Diagnosis not present

## 2018-04-09 ENCOUNTER — Encounter (HOSPITAL_COMMUNITY): Payer: BLUE CROSS/BLUE SHIELD

## 2018-04-11 ENCOUNTER — Ambulatory Visit (HOSPITAL_COMMUNITY): Payer: BLUE CROSS/BLUE SHIELD | Admitting: Occupational Therapy

## 2018-04-11 DIAGNOSIS — M75121 Complete rotator cuff tear or rupture of right shoulder, not specified as traumatic: Secondary | ICD-10-CM | POA: Diagnosis not present

## 2018-04-19 DIAGNOSIS — R05 Cough: Secondary | ICD-10-CM | POA: Diagnosis not present

## 2018-04-19 DIAGNOSIS — M545 Low back pain: Secondary | ICD-10-CM | POA: Diagnosis not present

## 2018-04-19 DIAGNOSIS — R062 Wheezing: Secondary | ICD-10-CM | POA: Diagnosis not present

## 2018-04-19 DIAGNOSIS — G894 Chronic pain syndrome: Secondary | ICD-10-CM | POA: Diagnosis not present

## 2018-05-03 DIAGNOSIS — G894 Chronic pain syndrome: Secondary | ICD-10-CM | POA: Diagnosis not present

## 2018-05-03 DIAGNOSIS — M79606 Pain in leg, unspecified: Secondary | ICD-10-CM | POA: Diagnosis not present

## 2018-05-03 DIAGNOSIS — M47817 Spondylosis without myelopathy or radiculopathy, lumbosacral region: Secondary | ICD-10-CM | POA: Diagnosis not present

## 2018-05-03 DIAGNOSIS — M25519 Pain in unspecified shoulder: Secondary | ICD-10-CM | POA: Diagnosis not present

## 2018-05-15 DIAGNOSIS — Z1231 Encounter for screening mammogram for malignant neoplasm of breast: Secondary | ICD-10-CM | POA: Diagnosis not present

## 2018-05-17 DIAGNOSIS — F419 Anxiety disorder, unspecified: Secondary | ICD-10-CM | POA: Diagnosis not present

## 2018-05-17 DIAGNOSIS — Z6831 Body mass index (BMI) 31.0-31.9, adult: Secondary | ICD-10-CM | POA: Diagnosis not present

## 2018-05-17 DIAGNOSIS — G894 Chronic pain syndrome: Secondary | ICD-10-CM | POA: Diagnosis not present

## 2018-05-17 DIAGNOSIS — F330 Major depressive disorder, recurrent, mild: Secondary | ICD-10-CM | POA: Diagnosis not present

## 2018-05-21 DIAGNOSIS — M75121 Complete rotator cuff tear or rupture of right shoulder, not specified as traumatic: Secondary | ICD-10-CM | POA: Diagnosis not present

## 2018-06-05 DIAGNOSIS — M79606 Pain in leg, unspecified: Secondary | ICD-10-CM | POA: Diagnosis not present

## 2018-06-05 DIAGNOSIS — M47817 Spondylosis without myelopathy or radiculopathy, lumbosacral region: Secondary | ICD-10-CM | POA: Diagnosis not present

## 2018-06-05 DIAGNOSIS — M25519 Pain in unspecified shoulder: Secondary | ICD-10-CM | POA: Diagnosis not present

## 2018-06-05 DIAGNOSIS — Z79891 Long term (current) use of opiate analgesic: Secondary | ICD-10-CM | POA: Diagnosis not present

## 2018-06-05 DIAGNOSIS — Z79899 Other long term (current) drug therapy: Secondary | ICD-10-CM | POA: Diagnosis not present

## 2018-06-05 DIAGNOSIS — G894 Chronic pain syndrome: Secondary | ICD-10-CM | POA: Diagnosis not present

## 2018-07-02 DIAGNOSIS — M75121 Complete rotator cuff tear or rupture of right shoulder, not specified as traumatic: Secondary | ICD-10-CM | POA: Diagnosis not present

## 2018-07-05 DIAGNOSIS — M25519 Pain in unspecified shoulder: Secondary | ICD-10-CM | POA: Diagnosis not present

## 2018-07-05 DIAGNOSIS — M47817 Spondylosis without myelopathy or radiculopathy, lumbosacral region: Secondary | ICD-10-CM | POA: Diagnosis not present

## 2018-07-05 DIAGNOSIS — G894 Chronic pain syndrome: Secondary | ICD-10-CM | POA: Diagnosis not present

## 2018-07-05 DIAGNOSIS — M79606 Pain in leg, unspecified: Secondary | ICD-10-CM | POA: Diagnosis not present

## 2018-07-26 DIAGNOSIS — M79606 Pain in leg, unspecified: Secondary | ICD-10-CM | POA: Diagnosis not present

## 2018-07-26 DIAGNOSIS — M25519 Pain in unspecified shoulder: Secondary | ICD-10-CM | POA: Diagnosis not present

## 2018-07-26 DIAGNOSIS — G894 Chronic pain syndrome: Secondary | ICD-10-CM | POA: Diagnosis not present

## 2018-07-26 DIAGNOSIS — Z79899 Other long term (current) drug therapy: Secondary | ICD-10-CM | POA: Diagnosis not present

## 2018-07-26 DIAGNOSIS — Z79891 Long term (current) use of opiate analgesic: Secondary | ICD-10-CM | POA: Diagnosis not present

## 2018-07-26 DIAGNOSIS — M47817 Spondylosis without myelopathy or radiculopathy, lumbosacral region: Secondary | ICD-10-CM | POA: Diagnosis not present

## 2018-08-23 DIAGNOSIS — R062 Wheezing: Secondary | ICD-10-CM | POA: Diagnosis not present

## 2018-08-23 DIAGNOSIS — M79606 Pain in leg, unspecified: Secondary | ICD-10-CM | POA: Diagnosis not present

## 2018-08-23 DIAGNOSIS — M5416 Radiculopathy, lumbar region: Secondary | ICD-10-CM | POA: Diagnosis not present

## 2018-08-23 DIAGNOSIS — R05 Cough: Secondary | ICD-10-CM | POA: Diagnosis not present

## 2018-08-23 DIAGNOSIS — I10 Essential (primary) hypertension: Secondary | ICD-10-CM | POA: Diagnosis not present

## 2018-08-23 DIAGNOSIS — F411 Generalized anxiety disorder: Secondary | ICD-10-CM | POA: Diagnosis not present

## 2018-08-23 DIAGNOSIS — M47817 Spondylosis without myelopathy or radiculopathy, lumbosacral region: Secondary | ICD-10-CM | POA: Diagnosis not present

## 2018-08-23 DIAGNOSIS — M25519 Pain in unspecified shoulder: Secondary | ICD-10-CM | POA: Diagnosis not present

## 2018-08-23 DIAGNOSIS — R7301 Impaired fasting glucose: Secondary | ICD-10-CM | POA: Diagnosis not present

## 2018-08-23 DIAGNOSIS — G894 Chronic pain syndrome: Secondary | ICD-10-CM | POA: Diagnosis not present

## 2018-08-23 DIAGNOSIS — D72829 Elevated white blood cell count, unspecified: Secondary | ICD-10-CM | POA: Diagnosis not present

## 2018-08-28 DIAGNOSIS — G894 Chronic pain syndrome: Secondary | ICD-10-CM | POA: Diagnosis not present

## 2018-08-28 DIAGNOSIS — E782 Mixed hyperlipidemia: Secondary | ICD-10-CM | POA: Diagnosis not present

## 2018-08-28 DIAGNOSIS — I10 Essential (primary) hypertension: Secondary | ICD-10-CM | POA: Diagnosis not present

## 2018-08-28 DIAGNOSIS — D72829 Elevated white blood cell count, unspecified: Secondary | ICD-10-CM | POA: Diagnosis not present

## 2018-08-28 DIAGNOSIS — Z23 Encounter for immunization: Secondary | ICD-10-CM | POA: Diagnosis not present

## 2018-09-27 DIAGNOSIS — Z79899 Other long term (current) drug therapy: Secondary | ICD-10-CM | POA: Diagnosis not present

## 2018-09-27 DIAGNOSIS — M47817 Spondylosis without myelopathy or radiculopathy, lumbosacral region: Secondary | ICD-10-CM | POA: Diagnosis not present

## 2018-09-27 DIAGNOSIS — G894 Chronic pain syndrome: Secondary | ICD-10-CM | POA: Diagnosis not present

## 2018-09-27 DIAGNOSIS — M5137 Other intervertebral disc degeneration, lumbosacral region: Secondary | ICD-10-CM | POA: Diagnosis not present

## 2018-09-27 DIAGNOSIS — Z79891 Long term (current) use of opiate analgesic: Secondary | ICD-10-CM | POA: Diagnosis not present

## 2018-09-27 DIAGNOSIS — M25519 Pain in unspecified shoulder: Secondary | ICD-10-CM | POA: Diagnosis not present

## 2018-10-25 DIAGNOSIS — G894 Chronic pain syndrome: Secondary | ICD-10-CM | POA: Diagnosis not present

## 2018-10-25 DIAGNOSIS — M47817 Spondylosis without myelopathy or radiculopathy, lumbosacral region: Secondary | ICD-10-CM | POA: Diagnosis not present

## 2018-10-25 DIAGNOSIS — M25519 Pain in unspecified shoulder: Secondary | ICD-10-CM | POA: Diagnosis not present

## 2018-10-25 DIAGNOSIS — M79606 Pain in leg, unspecified: Secondary | ICD-10-CM | POA: Diagnosis not present

## 2018-11-21 DIAGNOSIS — I10 Essential (primary) hypertension: Secondary | ICD-10-CM | POA: Diagnosis not present

## 2018-11-21 DIAGNOSIS — D72829 Elevated white blood cell count, unspecified: Secondary | ICD-10-CM | POA: Diagnosis not present

## 2018-11-21 DIAGNOSIS — R7301 Impaired fasting glucose: Secondary | ICD-10-CM | POA: Diagnosis not present

## 2018-11-21 DIAGNOSIS — E782 Mixed hyperlipidemia: Secondary | ICD-10-CM | POA: Diagnosis not present

## 2018-11-22 DIAGNOSIS — E782 Mixed hyperlipidemia: Secondary | ICD-10-CM | POA: Diagnosis not present

## 2018-11-22 DIAGNOSIS — I10 Essential (primary) hypertension: Secondary | ICD-10-CM | POA: Diagnosis not present

## 2018-11-22 DIAGNOSIS — R945 Abnormal results of liver function studies: Secondary | ICD-10-CM | POA: Diagnosis not present

## 2018-11-29 DIAGNOSIS — Z79891 Long term (current) use of opiate analgesic: Secondary | ICD-10-CM | POA: Diagnosis not present

## 2018-11-29 DIAGNOSIS — M47817 Spondylosis without myelopathy or radiculopathy, lumbosacral region: Secondary | ICD-10-CM | POA: Diagnosis not present

## 2018-11-29 DIAGNOSIS — M79606 Pain in leg, unspecified: Secondary | ICD-10-CM | POA: Diagnosis not present

## 2018-11-29 DIAGNOSIS — G894 Chronic pain syndrome: Secondary | ICD-10-CM | POA: Diagnosis not present

## 2018-11-29 DIAGNOSIS — M25519 Pain in unspecified shoulder: Secondary | ICD-10-CM | POA: Diagnosis not present

## 2018-11-29 DIAGNOSIS — Z79899 Other long term (current) drug therapy: Secondary | ICD-10-CM | POA: Diagnosis not present

## 2018-12-03 DIAGNOSIS — H0014 Chalazion left upper eyelid: Secondary | ICD-10-CM | POA: Diagnosis not present

## 2018-12-27 DIAGNOSIS — M25519 Pain in unspecified shoulder: Secondary | ICD-10-CM | POA: Diagnosis not present

## 2018-12-27 DIAGNOSIS — G894 Chronic pain syndrome: Secondary | ICD-10-CM | POA: Diagnosis not present

## 2018-12-27 DIAGNOSIS — M79606 Pain in leg, unspecified: Secondary | ICD-10-CM | POA: Diagnosis not present

## 2018-12-27 DIAGNOSIS — M47817 Spondylosis without myelopathy or radiculopathy, lumbosacral region: Secondary | ICD-10-CM | POA: Diagnosis not present

## 2019-01-24 DIAGNOSIS — M47817 Spondylosis without myelopathy or radiculopathy, lumbosacral region: Secondary | ICD-10-CM | POA: Diagnosis not present

## 2019-01-24 DIAGNOSIS — M25519 Pain in unspecified shoulder: Secondary | ICD-10-CM | POA: Diagnosis not present

## 2019-01-24 DIAGNOSIS — Z79891 Long term (current) use of opiate analgesic: Secondary | ICD-10-CM | POA: Diagnosis not present

## 2019-01-24 DIAGNOSIS — Z79899 Other long term (current) drug therapy: Secondary | ICD-10-CM | POA: Diagnosis not present

## 2019-01-24 DIAGNOSIS — G894 Chronic pain syndrome: Secondary | ICD-10-CM | POA: Diagnosis not present

## 2019-01-24 DIAGNOSIS — M79606 Pain in leg, unspecified: Secondary | ICD-10-CM | POA: Diagnosis not present

## 2019-02-05 DIAGNOSIS — M79609 Pain in unspecified limb: Secondary | ICD-10-CM | POA: Diagnosis not present

## 2019-02-05 DIAGNOSIS — M545 Low back pain: Secondary | ICD-10-CM | POA: Diagnosis not present

## 2019-02-12 DIAGNOSIS — G894 Chronic pain syndrome: Secondary | ICD-10-CM | POA: Diagnosis not present

## 2019-02-12 DIAGNOSIS — M5136 Other intervertebral disc degeneration, lumbar region: Secondary | ICD-10-CM | POA: Diagnosis not present

## 2019-02-21 DIAGNOSIS — G894 Chronic pain syndrome: Secondary | ICD-10-CM | POA: Diagnosis not present

## 2019-02-21 DIAGNOSIS — M25519 Pain in unspecified shoulder: Secondary | ICD-10-CM | POA: Diagnosis not present

## 2019-02-21 DIAGNOSIS — M5137 Other intervertebral disc degeneration, lumbosacral region: Secondary | ICD-10-CM | POA: Diagnosis not present

## 2019-02-21 DIAGNOSIS — M47817 Spondylosis without myelopathy or radiculopathy, lumbosacral region: Secondary | ICD-10-CM | POA: Diagnosis not present

## 2019-02-27 DIAGNOSIS — M4326 Fusion of spine, lumbar region: Secondary | ICD-10-CM | POA: Diagnosis not present

## 2019-02-27 DIAGNOSIS — M5137 Other intervertebral disc degeneration, lumbosacral region: Secondary | ICD-10-CM | POA: Diagnosis not present

## 2019-03-22 DIAGNOSIS — G894 Chronic pain syndrome: Secondary | ICD-10-CM | POA: Diagnosis not present

## 2019-03-22 DIAGNOSIS — Z79899 Other long term (current) drug therapy: Secondary | ICD-10-CM | POA: Diagnosis not present

## 2019-03-22 DIAGNOSIS — M79606 Pain in leg, unspecified: Secondary | ICD-10-CM | POA: Diagnosis not present

## 2019-03-22 DIAGNOSIS — M25519 Pain in unspecified shoulder: Secondary | ICD-10-CM | POA: Diagnosis not present

## 2019-03-22 DIAGNOSIS — Z79891 Long term (current) use of opiate analgesic: Secondary | ICD-10-CM | POA: Diagnosis not present

## 2019-03-22 DIAGNOSIS — M5137 Other intervertebral disc degeneration, lumbosacral region: Secondary | ICD-10-CM | POA: Diagnosis not present

## 2019-03-25 DIAGNOSIS — I10 Essential (primary) hypertension: Secondary | ICD-10-CM | POA: Diagnosis not present

## 2019-03-25 DIAGNOSIS — E782 Mixed hyperlipidemia: Secondary | ICD-10-CM | POA: Diagnosis not present

## 2019-03-25 DIAGNOSIS — R7301 Impaired fasting glucose: Secondary | ICD-10-CM | POA: Diagnosis not present

## 2019-03-27 DIAGNOSIS — M5137 Other intervertebral disc degeneration, lumbosacral region: Secondary | ICD-10-CM | POA: Diagnosis not present

## 2019-03-27 DIAGNOSIS — M4326 Fusion of spine, lumbar region: Secondary | ICD-10-CM | POA: Diagnosis not present

## 2019-03-28 DIAGNOSIS — R7301 Impaired fasting glucose: Secondary | ICD-10-CM | POA: Diagnosis not present

## 2019-03-28 DIAGNOSIS — E782 Mixed hyperlipidemia: Secondary | ICD-10-CM | POA: Diagnosis not present

## 2019-03-28 DIAGNOSIS — F17218 Nicotine dependence, cigarettes, with other nicotine-induced disorders: Secondary | ICD-10-CM | POA: Diagnosis not present

## 2019-03-28 DIAGNOSIS — R945 Abnormal results of liver function studies: Secondary | ICD-10-CM | POA: Diagnosis not present

## 2019-03-28 DIAGNOSIS — I10 Essential (primary) hypertension: Secondary | ICD-10-CM | POA: Diagnosis not present

## 2019-04-04 DIAGNOSIS — M4326 Fusion of spine, lumbar region: Secondary | ICD-10-CM | POA: Diagnosis not present

## 2019-04-04 DIAGNOSIS — M5137 Other intervertebral disc degeneration, lumbosacral region: Secondary | ICD-10-CM | POA: Diagnosis not present

## 2019-04-11 DIAGNOSIS — M4326 Fusion of spine, lumbar region: Secondary | ICD-10-CM | POA: Diagnosis not present

## 2019-04-11 DIAGNOSIS — M5137 Other intervertebral disc degeneration, lumbosacral region: Secondary | ICD-10-CM | POA: Diagnosis not present

## 2019-04-25 DIAGNOSIS — M47817 Spondylosis without myelopathy or radiculopathy, lumbosacral region: Secondary | ICD-10-CM | POA: Diagnosis not present

## 2019-04-25 DIAGNOSIS — M5137 Other intervertebral disc degeneration, lumbosacral region: Secondary | ICD-10-CM | POA: Diagnosis not present

## 2019-04-25 DIAGNOSIS — M79606 Pain in leg, unspecified: Secondary | ICD-10-CM | POA: Diagnosis not present

## 2019-04-25 DIAGNOSIS — G894 Chronic pain syndrome: Secondary | ICD-10-CM | POA: Diagnosis not present

## 2019-05-08 DIAGNOSIS — M961 Postlaminectomy syndrome, not elsewhere classified: Secondary | ICD-10-CM | POA: Diagnosis not present

## 2019-05-23 DIAGNOSIS — G894 Chronic pain syndrome: Secondary | ICD-10-CM | POA: Diagnosis not present

## 2019-05-23 DIAGNOSIS — M79606 Pain in leg, unspecified: Secondary | ICD-10-CM | POA: Diagnosis not present

## 2019-05-23 DIAGNOSIS — M47817 Spondylosis without myelopathy or radiculopathy, lumbosacral region: Secondary | ICD-10-CM | POA: Diagnosis not present

## 2019-05-23 DIAGNOSIS — Z79891 Long term (current) use of opiate analgesic: Secondary | ICD-10-CM | POA: Diagnosis not present

## 2019-05-23 DIAGNOSIS — M5137 Other intervertebral disc degeneration, lumbosacral region: Secondary | ICD-10-CM | POA: Diagnosis not present

## 2019-05-23 DIAGNOSIS — Z79899 Other long term (current) drug therapy: Secondary | ICD-10-CM | POA: Diagnosis not present

## 2019-06-10 DIAGNOSIS — F4323 Adjustment disorder with mixed anxiety and depressed mood: Secondary | ICD-10-CM | POA: Diagnosis not present

## 2019-06-19 DIAGNOSIS — F4322 Adjustment disorder with anxiety: Secondary | ICD-10-CM | POA: Diagnosis not present

## 2019-06-20 DIAGNOSIS — M47817 Spondylosis without myelopathy or radiculopathy, lumbosacral region: Secondary | ICD-10-CM | POA: Diagnosis not present

## 2019-06-20 DIAGNOSIS — M5137 Other intervertebral disc degeneration, lumbosacral region: Secondary | ICD-10-CM | POA: Diagnosis not present

## 2019-06-20 DIAGNOSIS — M79606 Pain in leg, unspecified: Secondary | ICD-10-CM | POA: Diagnosis not present

## 2019-06-20 DIAGNOSIS — G894 Chronic pain syndrome: Secondary | ICD-10-CM | POA: Diagnosis not present

## 2019-06-24 DIAGNOSIS — F419 Anxiety disorder, unspecified: Secondary | ICD-10-CM | POA: Diagnosis not present

## 2019-06-24 DIAGNOSIS — M62838 Other muscle spasm: Secondary | ICD-10-CM | POA: Diagnosis not present

## 2019-07-24 DIAGNOSIS — M79604 Pain in right leg: Secondary | ICD-10-CM | POA: Diagnosis not present

## 2019-07-24 DIAGNOSIS — M961 Postlaminectomy syndrome, not elsewhere classified: Secondary | ICD-10-CM | POA: Diagnosis not present

## 2019-07-24 DIAGNOSIS — M545 Low back pain: Secondary | ICD-10-CM | POA: Diagnosis not present

## 2019-08-05 DIAGNOSIS — G894 Chronic pain syndrome: Secondary | ICD-10-CM | POA: Diagnosis not present

## 2019-08-05 DIAGNOSIS — J019 Acute sinusitis, unspecified: Secondary | ICD-10-CM | POA: Diagnosis not present

## 2019-08-05 DIAGNOSIS — Z23 Encounter for immunization: Secondary | ICD-10-CM | POA: Diagnosis not present

## 2019-08-05 DIAGNOSIS — M5136 Other intervertebral disc degeneration, lumbar region: Secondary | ICD-10-CM | POA: Diagnosis not present

## 2019-08-22 DIAGNOSIS — M47817 Spondylosis without myelopathy or radiculopathy, lumbosacral region: Secondary | ICD-10-CM | POA: Diagnosis not present

## 2019-08-22 DIAGNOSIS — G894 Chronic pain syndrome: Secondary | ICD-10-CM | POA: Diagnosis not present

## 2019-08-22 DIAGNOSIS — Z79899 Other long term (current) drug therapy: Secondary | ICD-10-CM | POA: Diagnosis not present

## 2019-08-22 DIAGNOSIS — Z79891 Long term (current) use of opiate analgesic: Secondary | ICD-10-CM | POA: Diagnosis not present

## 2019-08-22 DIAGNOSIS — M79606 Pain in leg, unspecified: Secondary | ICD-10-CM | POA: Diagnosis not present

## 2019-08-22 DIAGNOSIS — M5137 Other intervertebral disc degeneration, lumbosacral region: Secondary | ICD-10-CM | POA: Diagnosis not present

## 2019-08-23 DIAGNOSIS — Z01818 Encounter for other preprocedural examination: Secondary | ICD-10-CM | POA: Diagnosis not present

## 2019-08-27 DIAGNOSIS — M797 Fibromyalgia: Secondary | ICD-10-CM | POA: Diagnosis not present

## 2019-08-27 DIAGNOSIS — E669 Obesity, unspecified: Secondary | ICD-10-CM | POA: Diagnosis not present

## 2019-08-27 DIAGNOSIS — Z882 Allergy status to sulfonamides status: Secondary | ICD-10-CM | POA: Diagnosis not present

## 2019-08-27 DIAGNOSIS — F17200 Nicotine dependence, unspecified, uncomplicated: Secondary | ICD-10-CM | POA: Diagnosis not present

## 2019-08-27 DIAGNOSIS — E785 Hyperlipidemia, unspecified: Secondary | ICD-10-CM | POA: Diagnosis not present

## 2019-08-27 DIAGNOSIS — Z885 Allergy status to narcotic agent status: Secondary | ICD-10-CM | POA: Diagnosis not present

## 2019-08-27 DIAGNOSIS — M545 Low back pain: Secondary | ICD-10-CM | POA: Diagnosis not present

## 2019-08-27 DIAGNOSIS — M961 Postlaminectomy syndrome, not elsewhere classified: Secondary | ICD-10-CM | POA: Diagnosis not present

## 2019-08-27 DIAGNOSIS — Z79899 Other long term (current) drug therapy: Secondary | ICD-10-CM | POA: Diagnosis not present

## 2019-08-27 DIAGNOSIS — M79604 Pain in right leg: Secondary | ICD-10-CM | POA: Diagnosis not present

## 2019-08-27 DIAGNOSIS — Z888 Allergy status to other drugs, medicaments and biological substances status: Secondary | ICD-10-CM | POA: Diagnosis not present

## 2019-08-27 DIAGNOSIS — I10 Essential (primary) hypertension: Secondary | ICD-10-CM | POA: Diagnosis not present

## 2019-08-27 DIAGNOSIS — Z6832 Body mass index (BMI) 32.0-32.9, adult: Secondary | ICD-10-CM | POA: Diagnosis not present

## 2019-08-27 DIAGNOSIS — K219 Gastro-esophageal reflux disease without esophagitis: Secondary | ICD-10-CM | POA: Diagnosis not present

## 2019-08-27 DIAGNOSIS — G894 Chronic pain syndrome: Secondary | ICD-10-CM | POA: Diagnosis not present

## 2019-09-19 DIAGNOSIS — M5137 Other intervertebral disc degeneration, lumbosacral region: Secondary | ICD-10-CM | POA: Diagnosis not present

## 2019-09-19 DIAGNOSIS — G894 Chronic pain syndrome: Secondary | ICD-10-CM | POA: Diagnosis not present

## 2019-09-19 DIAGNOSIS — M79606 Pain in leg, unspecified: Secondary | ICD-10-CM | POA: Diagnosis not present

## 2019-09-19 DIAGNOSIS — M47817 Spondylosis without myelopathy or radiculopathy, lumbosacral region: Secondary | ICD-10-CM | POA: Diagnosis not present

## 2019-10-10 DIAGNOSIS — M47817 Spondylosis without myelopathy or radiculopathy, lumbosacral region: Secondary | ICD-10-CM | POA: Diagnosis not present

## 2019-10-10 DIAGNOSIS — G894 Chronic pain syndrome: Secondary | ICD-10-CM | POA: Diagnosis not present

## 2019-10-10 DIAGNOSIS — M5137 Other intervertebral disc degeneration, lumbosacral region: Secondary | ICD-10-CM | POA: Diagnosis not present

## 2019-10-10 DIAGNOSIS — M25519 Pain in unspecified shoulder: Secondary | ICD-10-CM | POA: Diagnosis not present

## 2019-11-04 DIAGNOSIS — F411 Generalized anxiety disorder: Secondary | ICD-10-CM | POA: Diagnosis not present

## 2019-11-04 DIAGNOSIS — G894 Chronic pain syndrome: Secondary | ICD-10-CM | POA: Diagnosis not present

## 2019-11-04 DIAGNOSIS — F330 Major depressive disorder, recurrent, mild: Secondary | ICD-10-CM | POA: Diagnosis not present

## 2019-11-04 DIAGNOSIS — I10 Essential (primary) hypertension: Secondary | ICD-10-CM | POA: Diagnosis not present

## 2019-11-05 DIAGNOSIS — Z79891 Long term (current) use of opiate analgesic: Secondary | ICD-10-CM | POA: Diagnosis not present

## 2019-11-05 DIAGNOSIS — M47817 Spondylosis without myelopathy or radiculopathy, lumbosacral region: Secondary | ICD-10-CM | POA: Diagnosis not present

## 2019-11-05 DIAGNOSIS — M5137 Other intervertebral disc degeneration, lumbosacral region: Secondary | ICD-10-CM | POA: Diagnosis not present

## 2019-11-05 DIAGNOSIS — G894 Chronic pain syndrome: Secondary | ICD-10-CM | POA: Diagnosis not present

## 2019-11-05 DIAGNOSIS — Z79899 Other long term (current) drug therapy: Secondary | ICD-10-CM | POA: Diagnosis not present

## 2019-11-05 DIAGNOSIS — M25519 Pain in unspecified shoulder: Secondary | ICD-10-CM | POA: Diagnosis not present

## 2019-12-10 DIAGNOSIS — M5137 Other intervertebral disc degeneration, lumbosacral region: Secondary | ICD-10-CM | POA: Diagnosis not present

## 2019-12-10 DIAGNOSIS — M79606 Pain in leg, unspecified: Secondary | ICD-10-CM | POA: Diagnosis not present

## 2019-12-10 DIAGNOSIS — M47817 Spondylosis without myelopathy or radiculopathy, lumbosacral region: Secondary | ICD-10-CM | POA: Diagnosis not present

## 2019-12-10 DIAGNOSIS — G894 Chronic pain syndrome: Secondary | ICD-10-CM | POA: Diagnosis not present

## 2021-08-25 ENCOUNTER — Other Ambulatory Visit: Payer: Self-pay | Admitting: Orthopedic Surgery

## 2021-08-25 DIAGNOSIS — M5416 Radiculopathy, lumbar region: Secondary | ICD-10-CM

## 2021-09-04 ENCOUNTER — Inpatient Hospital Stay: Admission: RE | Admit: 2021-09-04 | Payer: BLUE CROSS/BLUE SHIELD | Source: Ambulatory Visit

## 2021-12-13 DIAGNOSIS — M5416 Radiculopathy, lumbar region: Secondary | ICD-10-CM | POA: Diagnosis not present

## 2021-12-27 DIAGNOSIS — M5416 Radiculopathy, lumbar region: Secondary | ICD-10-CM | POA: Diagnosis not present

## 2021-12-31 ENCOUNTER — Ambulatory Visit
Admission: RE | Admit: 2021-12-31 | Discharge: 2021-12-31 | Disposition: A | Discharge status: Home / Self Care | Payer: Self-pay | Source: Ambulatory Visit | Attending: Orthopedic Surgery | Admitting: Orthopedic Surgery

## 2021-12-31 ENCOUNTER — Other Ambulatory Visit: Payer: Self-pay | Admitting: Orthopedic Surgery

## 2021-12-31 DIAGNOSIS — M545 Low back pain, unspecified: Secondary | ICD-10-CM | POA: Diagnosis not present

## 2021-12-31 DIAGNOSIS — M5416 Radiculopathy, lumbar region: Secondary | ICD-10-CM

## 2021-12-31 DIAGNOSIS — M79605 Pain in left leg: Secondary | ICD-10-CM | POA: Diagnosis not present

## 2022-01-03 NOTE — Progress Notes (Signed)
Surgical Instructions    Your procedure is scheduled on Thursday February 2nd.  Report to [facility] Main Entrance "A" at 5:30 A.M., then check in with the Admitting office.  Call this number if you have problems the morning of surgery:  [phone]   If you have any questions prior to your surgery date call [phone]: Open Monday-Friday 8am-4pm    Remember:  Do not eat after midnight the night before your surgery  You may drink clear liquids until 4:30am the morning of your surgery.   Clear liquids allowed are: Water, Non-Citrus Juices (without pulp), Carbonated Beverages, Clear Tea, Black Coffee ONLY (NO MILK, CREAM OR POWDERED CREAMER of any kind), and Gatorade  Please complete your PRE-SURGERY ENSURE that was provided to you by ..4:30am. the morning of surgery.  Please, if able, drink it in one setting. DO NOT SIP.     Take these medicines the morning of surgery with A SIP OF WATER atorvastatin (LIPITOR) 40 MG tablet buPROPion (WELLBUTRIN XL) 150 MG 24 hr tablet citalopram (CELEXA) 40 MG tablet fenofibrate 160 MG tablet gabapentin (NEURONTIN) 300 MG capsule   IF NEEDED  ALPRAZolam (XANAX) 1 MG tablet carisoprodol (SOMA) 350 MG tablet HYDROcodone-acetaminophen (NORCO) 10-325 MG tablet naloxone (NARCAN) nasal spray 4 mg/0.1 mL   As of today, STOP taking any Aspirin (unless otherwise instructed by your surgeon) Voltaren, Aleve, Naproxen, Ibuprofen, Motrin, Advil, Goody's, BC's, all herbal medications, fish oil, and all vitamins.  After your COVID test   You are not required to quarantine however you are required to wear a well-fitting mask when you are out and around people not in your household.  If your mask becomes wet or soiled, replace with a new one.  Wash your hands often with soap and water for 20 seconds or clean your hands with an alcohol-based hand sanitizer that contains at least 60% alcohol.  Do not share personal items.  Notify your provider: if you  are in close contact with someone who has COVID  or if you develop a fever of 100.4 or greater, sneezing, cough, sore throat, shortness of breath or body aches.           Do not wear jewelry or makeup Do not wear lotions, powders, perfumes, or deodorant. Do not shave 48 hours prior to surgery.   Do not bring valuables to the hospital. Do not wear nail polish, gel polish, artificial nails, or any other type of covering on natural nails (fingers and toes) If you have artificial nails or gel coating that need to be removed by a nail salon, please have this removed prior to surgery. Artificial nails or gel coating may interfere with anesthesia's ability to adequately monitor your vital signs.             [facility] is not responsible for any belongings or valuables.  Do NOT Smoke (Tobacco/Vaping)  24 hours prior to your procedure  If you use a CPAP at night, you may bring your mask for your overnight stay.   Contacts, glasses, hearing aids, dentures or partials may not be worn into surgery, please bring cases for these belongings   For patients admitted to the hospital, discharge time will be determined by your treatment team.   Patients discharged the day of surgery will not be allowed to drive home, and someone needs to stay with them for 24 hours.  NO VISITORS WILL BE ALLOWED IN PRE-OP WHERE PATIENTS ARE PREPPED FOR SURGERY.  ONLY 1 SUPPORT PERSON  MAY BE PRESENT IN THE WAITING ROOM WHILE YOU ARE IN SURGERY.  IF YOU ARE TO BE ADMITTED, ONCE YOU ARE IN YOUR ROOM YOU WILL BE ALLOWED TWO (2) VISITORS. 1 (ONE) VISITOR MAY STAY OVERNIGHT BUT MUST ARRIVE TO THE ROOM BY 8pm.  Minor children may have two parents present. Special consideration for safety and communication needs will be reviewed on a case by case basis.  Special instructions:    Oral Hygiene is also important to reduce your risk of infection.  Remember - BRUSH YOUR TEETH THE MORNING OF SURGERY WITH YOUR REGULAR TOOTHPASTE   [facility]- Preparing For Surgery  Before surgery, you can play an important role. Because skin is not sterile, your skin needs to be as free of germs as possible. You can reduce the number of germs on your skin by washing with CHG (chlorahexidine gluconate) Soap before surgery.  CHG is an antiseptic cleaner which kills germs and bonds with the skin to continue killing germs even after washing.     Please do not use if you have an allergy to CHG or antibacterial soaps. If your skin becomes reddened/irritated stop using the CHG.  Do not shave (including legs and underarms) for at least 48 hours prior to first CHG shower. It is OK to shave your face.  Please follow these instructions carefully.     Shower the NIGHT BEFORE SURGERY and the MORNING OF SURGERY with CHG Soap.   If you chose to wash your hair, wash your hair first as usual with your normal shampoo. After you shampoo, rinse your hair and body thoroughly to remove the shampoo.  Then [employer] and genitals (private parts) with your normal soap and rinse thoroughly to remove soap.  After that Use CHG Soap as you would any other liquid soap. You can apply CHG directly to the skin and wash gently with a scrungie or a clean washcloth.   Apply the CHG Soap to your body ONLY FROM THE NECK DOWN.  Do not use on open wounds or open sores. Avoid contact with your eyes, ears, mouth and genitals (private parts). Wash Face and genitals (private parts)  with your normal soap.   Wash thoroughly, paying special attention to the area where your surgery will be performed.  Thoroughly rinse your body with warm water from the neck down.  DO NOT shower/wash with your normal soap after using and rinsing off the CHG Soap.  Pat yourself dry with a CLEAN TOWEL.  Wear CLEAN PAJAMAS to bed the night before surgery  Place CLEAN SHEETS on your bed the night before your surgery  DO NOT SLEEP WITH PETS.   Day of Surgery:  Take a shower with CHG soap. Wear  Clean/Comfortable clothing the morning of surgery Do not apply any deodorants/lotions.   Remember to brush your teeth WITH YOUR REGULAR TOOTHPASTE.   Please read over the following fact sheets that you were given.

## 2022-01-04 ENCOUNTER — Other Ambulatory Visit: Payer: Self-pay

## 2022-01-04 ENCOUNTER — Encounter (HOSPITAL_COMMUNITY)
Admission: RE | Admit: 2022-01-04 | Discharge: 2022-01-04 | Disposition: A | Discharge status: Home / Self Care | Payer: 59 | Source: Ambulatory Visit | Attending: Orthopedic Surgery | Admitting: Orthopedic Surgery

## 2022-01-04 ENCOUNTER — Encounter (HOSPITAL_COMMUNITY): Payer: Self-pay

## 2022-01-04 VITALS — BP 117/76 | HR 75 | Temp 98.1°F | Resp 18 | Ht 66.0 in | Wt 201.6 lb

## 2022-01-04 DIAGNOSIS — Z981 Arthrodesis status: Secondary | ICD-10-CM | POA: Diagnosis not present

## 2022-01-04 DIAGNOSIS — E785 Hyperlipidemia, unspecified: Secondary | ICD-10-CM | POA: Diagnosis not present

## 2022-01-04 DIAGNOSIS — M47896 Other spondylosis, lumbar region: Secondary | ICD-10-CM | POA: Diagnosis not present

## 2022-01-04 DIAGNOSIS — I10 Essential (primary) hypertension: Secondary | ICD-10-CM | POA: Diagnosis not present

## 2022-01-04 DIAGNOSIS — Z01818 Encounter for other preprocedural examination: Secondary | ICD-10-CM

## 2022-01-04 DIAGNOSIS — M5416 Radiculopathy, lumbar region: Secondary | ICD-10-CM

## 2022-01-04 DIAGNOSIS — Z20822 Contact with and (suspected) exposure to covid-19: Secondary | ICD-10-CM | POA: Insufficient documentation

## 2022-01-04 DIAGNOSIS — Z87442 Personal history of urinary calculi: Secondary | ICD-10-CM | POA: Diagnosis not present

## 2022-01-04 DIAGNOSIS — M48061 Spinal stenosis, lumbar region without neurogenic claudication: Secondary | ICD-10-CM | POA: Diagnosis not present

## 2022-01-04 DIAGNOSIS — M961 Postlaminectomy syndrome, not elsewhere classified: Secondary | ICD-10-CM

## 2022-01-04 DIAGNOSIS — T85840A Pain due to nervous system prosthetic devices, implants and grafts, initial encounter: Secondary | ICD-10-CM | POA: Diagnosis not present

## 2022-01-04 DIAGNOSIS — R69 Illness, unspecified: Secondary | ICD-10-CM | POA: Diagnosis not present

## 2022-01-04 HISTORY — DX: Prediabetes: R73.03

## 2022-01-04 LAB — CBC
HCT: 44.4 % (ref 36.0–46.0)
Hemoglobin: 14.2 g/dL (ref 12.0–15.0)
MCH: 30.1 pg (ref 26.0–34.0)
MCHC: 32 g/dL (ref 30.0–36.0)
MCV: 94.1 fL (ref 80.0–100.0)
Platelets: 352 10*3/uL (ref 150–400)
RBC: 4.72 MIL/uL (ref 3.87–5.11)
RDW: 12.3 % (ref 11.5–15.5)
WBC: 11.2 10*3/uL — ABNORMAL HIGH (ref 4.0–10.5)
nRBC: 0 % (ref 0.0–0.2)

## 2022-01-04 LAB — TYPE AND SCREEN
ABO/RH(D): O POS
Antibody Screen: NEGATIVE

## 2022-01-04 LAB — BASIC METABOLIC PANEL
Anion gap: 9 (ref 5–15)
BUN: 20 mg/dL (ref 6–20)
CO2: 30 mmol/L (ref 22–32)
Calcium: 10.9 mg/dL — ABNORMAL HIGH (ref 8.9–10.3)
Chloride: 103 mmol/L (ref 98–111)
Creatinine, Ser: 1.1 mg/dL — ABNORMAL HIGH (ref 0.44–1.00)
GFR, Estimated: 58 mL/min — ABNORMAL LOW (ref >60)
Glucose, Bld: 92 mg/dL (ref 70–99)
Potassium: 3.7 mmol/L (ref 3.5–5.1)
Sodium: 142 mmol/L (ref 135–145)

## 2022-01-04 LAB — HEMOGLOBIN A1C
Hgb A1c MFr Bld: 5.8 % — ABNORMAL HIGH (ref 4.8–5.6)
Mean Plasma Glucose: 119.76 mg/dL

## 2022-01-04 LAB — SARS CORONAVIRUS 2 (TAT 6-24 HRS): SARS Coronavirus 2: NEGATIVE

## 2022-01-04 LAB — SURGICAL PCR SCREEN
MRSA, PCR: NEGATIVE
Staphylococcus aureus: NEGATIVE

## 2022-01-04 LAB — GLUCOSE, CAPILLARY: Glucose-Capillary: 180 mg/dL — ABNORMAL HIGH (ref 70–99)

## 2022-01-04 NOTE — Progress Notes (Signed)
PCP - Dr. Delano Metz / sees Karie Fetch who is under Dr. Salvadore Farber Cardiologist - Denies  Chest x-ray - NOt indicated EKG - 01/04/22 Stress Test - Denies ECHO - Denies Cardiac Cath - Denies  Sleep Study - Yes no OSA  Pre diabetic CBG at PAT was 180. Had eaten Chik fil a sandwich with ff at 1215.  Will check A1C today. Fasting Blood Sugar - at home 121 checks randomly with children's meter  ERAS Protcol -Yes  PRE-SURGERY Ensure given    COVID TEST- 01/04/22   Anesthesia review: No  Patient denies shortness of breath, fever, cough and chest pain at PAT appointment   All instructions explained to the patient, with a verbal understanding of the material. Patient agrees to go over the instructions while at home for a better understanding. Patient also instructed to wear a mask while in public after being tested for COVID-19. The opportunity to ask questions was provided.

## 2022-01-05 NOTE — Anesthesia Preprocedure Evaluation (Addendum)
Anesthesia Evaluation  Patient identified by MRN, date of birth, ID band Patient awake    Reviewed: Allergy & Precautions, H&P , NPO status , Patient's Chart, lab work & pertinent test results  History of Anesthesia Complications (+) PONV and history of anesthetic complications  Airway Mallampati: III  TM Distance: <3 FB Neck ROM: Full    Dental no notable dental hx. (+) Teeth Intact, Dental Advisory Given, Caps   Pulmonary neg pulmonary ROS, former smoker,    Pulmonary exam normal breath sounds clear to auscultation       Cardiovascular Exercise Tolerance: Good hypertension, Pt. on medications negative cardio ROS Normal cardiovascular exam Rhythm:Regular Rate:Normal     Neuro/Psych  Headaches, PSYCHIATRIC DISORDERS Anxiety Depression  Neuromuscular disease negative neurological ROS  negative psych ROS   GI/Hepatic negative GI ROS, Neg liver ROS, GERD  Medicated,  Endo/Other  negative endocrine ROS  Renal/GU negative Renal ROS  negative genitourinary   Musculoskeletal negative musculoskeletal ROS (+) Arthritis , Osteoarthritis,  Fibromyalgia -  Abdominal   Peds negative pediatric ROS (+)  Hematology negative hematology ROS (+) Blood dyscrasia, anemia ,   Anesthesia Other Findings   Reproductive/Obstetrics negative OB ROS                           Anesthesia Physical Anesthesia Plan  ASA: 3  Anesthesia Plan: General   Post-op Pain Management: Ofirmev IV (intra-op), Ketamine IV and Dilaudid IV   Induction: Intravenous  PONV Risk Score and Plan: 3 and Ondansetron, Midazolam and Treatment may vary due to age or medical condition  Airway Management Planned: Oral ETT and Video Laryngoscope Planned  Additional Equipment: None  Intra-op Plan:   Post-operative Plan: Extubation in OR  Informed Consent: I have reviewed the patients History and Physical, chart, labs and discussed the  procedure including the risks, benefits and alternatives for the proposed anesthesia with the patient or authorized representative who has indicated his/her understanding and acceptance.       Plan Discussed with: Anesthesiologist and CRNA  Anesthesia Plan Comments: (  Previous GAET anterior larynx note  Difficulty Due To: Difficulty was unanticipated and Difficult Airway- due to anterior larynx Future Recommendations: Recommend- induction with short-acting agent, and alternative techniques readily available Comments: First attempt with Mac 3 and Grade IV view, Second attempt successful with Mil 2, Grade I view but unable to insert ETT, maintained view and used Bougie for inserting ETT.)       Anesthesia Quick Evaluation

## 2022-01-06 ENCOUNTER — Inpatient Hospital Stay (HOSPITAL_COMMUNITY)
Admission: RE | Admit: 2022-01-06 | Discharge: 2022-01-07 | DRG: 454 | Disposition: A | Discharge status: Home / Self Care | Payer: 59 | Attending: Orthopedic Surgery | Admitting: Orthopedic Surgery

## 2022-01-06 ENCOUNTER — Inpatient Hospital Stay (HOSPITAL_COMMUNITY): Payer: 59 | Admitting: Vascular Surgery

## 2022-01-06 ENCOUNTER — Inpatient Hospital Stay (HOSPITAL_COMMUNITY): Payer: 59

## 2022-01-06 ENCOUNTER — Encounter (HOSPITAL_COMMUNITY)
Admission: RE | Disposition: A | Discharge status: Home / Self Care | Payer: Self-pay | Source: Home / Self Care | Attending: Orthopedic Surgery

## 2022-01-06 ENCOUNTER — Other Ambulatory Visit: Payer: Self-pay

## 2022-01-06 ENCOUNTER — Encounter (HOSPITAL_COMMUNITY): Payer: Self-pay | Admitting: Orthopedic Surgery

## 2022-01-06 DIAGNOSIS — R7303 Prediabetes: Secondary | ICD-10-CM | POA: Diagnosis present

## 2022-01-06 DIAGNOSIS — Z419 Encounter for procedure for purposes other than remedying health state, unspecified: Secondary | ICD-10-CM

## 2022-01-06 DIAGNOSIS — F419 Anxiety disorder, unspecified: Secondary | ICD-10-CM | POA: Diagnosis present

## 2022-01-06 DIAGNOSIS — T85193A Other mechanical complication of implanted electronic neurostimulator, generator, initial encounter: Secondary | ICD-10-CM | POA: Diagnosis not present

## 2022-01-06 DIAGNOSIS — I10 Essential (primary) hypertension: Secondary | ICD-10-CM | POA: Diagnosis not present

## 2022-01-06 DIAGNOSIS — M1990 Unspecified osteoarthritis, unspecified site: Secondary | ICD-10-CM | POA: Diagnosis present

## 2022-01-06 DIAGNOSIS — Z8249 Family history of ischemic heart disease and other diseases of the circulatory system: Secondary | ICD-10-CM

## 2022-01-06 DIAGNOSIS — Z820 Family history of epilepsy and other diseases of the nervous system: Secondary | ICD-10-CM

## 2022-01-06 DIAGNOSIS — Z881 Allergy status to other antibiotic agents status: Secondary | ICD-10-CM | POA: Diagnosis not present

## 2022-01-06 DIAGNOSIS — M5416 Radiculopathy, lumbar region: Secondary | ICD-10-CM | POA: Diagnosis not present

## 2022-01-06 DIAGNOSIS — M797 Fibromyalgia: Secondary | ICD-10-CM | POA: Diagnosis present

## 2022-01-06 DIAGNOSIS — K219 Gastro-esophageal reflux disease without esophagitis: Secondary | ICD-10-CM | POA: Diagnosis present

## 2022-01-06 DIAGNOSIS — Z888 Allergy status to other drugs, medicaments and biological substances status: Secondary | ICD-10-CM | POA: Diagnosis not present

## 2022-01-06 DIAGNOSIS — M47896 Other spondylosis, lumbar region: Secondary | ICD-10-CM | POA: Diagnosis present

## 2022-01-06 DIAGNOSIS — Z806 Family history of leukemia: Secondary | ICD-10-CM | POA: Diagnosis not present

## 2022-01-06 DIAGNOSIS — Z981 Arthrodesis status: Secondary | ICD-10-CM

## 2022-01-06 DIAGNOSIS — E785 Hyperlipidemia, unspecified: Secondary | ICD-10-CM | POA: Diagnosis present

## 2022-01-06 DIAGNOSIS — T85840A Pain due to nervous system prosthetic devices, implants and grafts, initial encounter: Secondary | ICD-10-CM | POA: Diagnosis present

## 2022-01-06 DIAGNOSIS — Z885 Allergy status to narcotic agent status: Secondary | ICD-10-CM

## 2022-01-06 DIAGNOSIS — Z8042 Family history of malignant neoplasm of prostate: Secondary | ICD-10-CM | POA: Diagnosis not present

## 2022-01-06 DIAGNOSIS — M48061 Spinal stenosis, lumbar region without neurogenic claudication: Secondary | ICD-10-CM | POA: Diagnosis not present

## 2022-01-06 DIAGNOSIS — Z87891 Personal history of nicotine dependence: Secondary | ICD-10-CM | POA: Diagnosis not present

## 2022-01-06 DIAGNOSIS — Z833 Family history of diabetes mellitus: Secondary | ICD-10-CM

## 2022-01-06 DIAGNOSIS — Z87442 Personal history of urinary calculi: Secondary | ICD-10-CM

## 2022-01-06 DIAGNOSIS — M5410 Radiculopathy, site unspecified: Secondary | ICD-10-CM | POA: Diagnosis present

## 2022-01-06 DIAGNOSIS — F32A Depression, unspecified: Secondary | ICD-10-CM | POA: Diagnosis present

## 2022-01-06 DIAGNOSIS — Z20822 Contact with and (suspected) exposure to covid-19: Secondary | ICD-10-CM | POA: Diagnosis present

## 2022-01-06 DIAGNOSIS — Y838 Other surgical procedures as the cause of abnormal reaction of the patient, or of later complication, without mention of misadventure at the time of the procedure: Secondary | ICD-10-CM | POA: Diagnosis present

## 2022-01-06 HISTORY — PX: TRANSFORAMINAL LUMBAR INTERBODY FUSION (TLIF) WITH PEDICLE SCREW FIXATION 1 LEVEL: SHX6141

## 2022-01-06 LAB — GLUCOSE, CAPILLARY: Glucose-Capillary: 95 mg/dL (ref 70–99)

## 2022-01-06 SURGERY — TRANSFORAMINAL LUMBAR INTERBODY FUSION (TLIF) WITH PEDICLE SCREW FIXATION 1 LEVEL
Anesthesia: General | Site: Back | Laterality: Right

## 2022-01-06 MED ORDER — FENTANYL CITRATE (PF) 100 MCG/2ML IJ SOLN
INTRAMUSCULAR | Status: AC
  Filled 2022-01-06: qty 2

## 2022-01-06 MED ORDER — LIDOCAINE 2% (20 MG/ML) 5 ML SYRINGE
INTRAMUSCULAR | Status: AC
  Filled 2022-01-06: qty 5

## 2022-01-06 MED ORDER — DEXAMETHASONE SODIUM PHOSPHATE 10 MG/ML IJ SOLN
INTRAMUSCULAR | Status: DC | PRN
Start: 2022-01-06 — End: 2022-01-06
  Administered 2022-01-06: 8 mg via INTRAVENOUS

## 2022-01-06 MED ORDER — GABAPENTIN 300 MG PO CAPS
300.0000 mg | ORAL_CAPSULE | Freq: Two times a day (BID) | ORAL | Status: DC
  Administered 2022-01-06: 300 mg via ORAL
  Filled 2022-01-06: qty 1

## 2022-01-06 MED ORDER — ACETAMINOPHEN 10 MG/ML IV SOLN
INTRAVENOUS | Status: DC | PRN
Start: 2022-01-06 — End: 2022-01-06
  Administered 2022-01-06: 1000 mg via INTRAVENOUS

## 2022-01-06 MED ORDER — ACETAMINOPHEN 650 MG RE SUPP: 650.0000 mg | RECTAL | Status: DC | PRN

## 2022-01-06 MED ORDER — BUPROPION HCL ER (XL) 150 MG PO TB24
150.0000 mg | ORAL_TABLET | Freq: Every day | ORAL | Status: DC
  Filled 2022-01-06: qty 1

## 2022-01-06 MED ORDER — ROCURONIUM BROMIDE 10 MG/ML (PF) SYRINGE
PREFILLED_SYRINGE | INTRAVENOUS | Status: AC
  Filled 2022-01-06: qty 10

## 2022-01-06 MED ORDER — NALOXONE HCL 4 MG/0.1ML NA LIQD: 1.0000 | Freq: Once | NASAL | Status: DC

## 2022-01-06 MED ORDER — OXYCODONE HCL 5 MG/5ML PO SOLN: 5.0000 mg | Freq: Once | ORAL | Status: DC | PRN

## 2022-01-06 MED ORDER — HYDROMORPHONE HCL 1 MG/ML IJ SOLN: 0.5000 mg | INTRAMUSCULAR | Status: DC | PRN

## 2022-01-06 MED ORDER — PROPOFOL 10 MG/ML IV BOLUS
INTRAVENOUS | Status: AC
  Filled 2022-01-06: qty 20

## 2022-01-06 MED ORDER — ACETAMINOPHEN 325 MG PO TABS: 325.0000 mg | ORAL_TABLET | ORAL | Status: DC | PRN

## 2022-01-06 MED ORDER — PHENYLEPHRINE 40 MCG/ML (10ML) SYRINGE FOR IV PUSH (FOR BLOOD PRESSURE SUPPORT)
PREFILLED_SYRINGE | INTRAVENOUS | Status: DC | PRN
  Administered 2022-01-06: 120 ug via INTRAVENOUS

## 2022-01-06 MED ORDER — ACETAMINOPHEN 160 MG/5ML PO SOLN: 325.0000 mg | ORAL | Status: DC | PRN

## 2022-01-06 MED ORDER — LACTATED RINGERS IV SOLN: INTRAVENOUS | Status: DC

## 2022-01-06 MED ORDER — HYDROCODONE-ACETAMINOPHEN 5-325 MG PO TABS: 1.0000 | ORAL_TABLET | ORAL | Status: DC | PRN

## 2022-01-06 MED ORDER — METHOCARBAMOL 500 MG PO TABS
500.0000 mg | ORAL_TABLET | Freq: Four times a day (QID) | ORAL | Status: DC | PRN
  Administered 2022-01-06 – 2022-01-07 (×3): 500 mg via ORAL
  Filled 2022-01-06 (×3): qty 1

## 2022-01-06 MED ORDER — ROCURONIUM BROMIDE 10 MG/ML (PF) SYRINGE
PREFILLED_SYRINGE | INTRAVENOUS | Status: DC | PRN
  Administered 2022-01-06: 20 mg via INTRAVENOUS
  Administered 2022-01-06 (×2): 50 mg via INTRAVENOUS

## 2022-01-06 MED ORDER — CEFAZOLIN SODIUM-DEXTROSE 2-4 GM/100ML-% IV SOLN
INTRAVENOUS | Status: AC
  Filled 2022-01-06: qty 100

## 2022-01-06 MED ORDER — HYDROCHLOROTHIAZIDE 12.5 MG PO TABS: 12.5000 mg | ORAL_TABLET | Freq: Every day | ORAL | Status: DC

## 2022-01-06 MED ORDER — FENOFIBRATE 160 MG PO TABS: 160.0000 mg | ORAL_TABLET | Freq: Every day | ORAL | Status: DC

## 2022-01-06 MED ORDER — OXYCODONE HCL 5 MG PO TABS: 5.0000 mg | ORAL_TABLET | Freq: Once | ORAL | Status: DC | PRN

## 2022-01-06 MED ORDER — KETAMINE HCL 50 MG/5ML IJ SOSY
PREFILLED_SYRINGE | INTRAMUSCULAR | Status: AC
  Filled 2022-01-06: qty 5

## 2022-01-06 MED ORDER — VITAMIN D 25 MCG (1000 UNIT) PO TABS: 5000.0000 [IU] | ORAL_TABLET | Freq: Every day | ORAL | Status: DC

## 2022-01-06 MED ORDER — CARISOPRODOL 350 MG PO TABS
350.0000 mg | ORAL_TABLET | Freq: Three times a day (TID) | ORAL | Status: DC
  Administered 2022-01-06 (×2): 350 mg via ORAL
  Filled 2022-01-06 (×2): qty 1

## 2022-01-06 MED ORDER — LISINOPRIL 20 MG PO TABS: 20.0000 mg | ORAL_TABLET | Freq: Every day | ORAL | Status: DC

## 2022-01-06 MED ORDER — BISACODYL 5 MG PO TBEC: 5.0000 mg | DELAYED_RELEASE_TABLET | Freq: Every day | ORAL | Status: DC | PRN

## 2022-01-06 MED ORDER — BUPIVACAINE-EPINEPHRINE (PF) 0.25% -1:200000 IJ SOLN
INTRAMUSCULAR | Status: AC
  Filled 2022-01-06: qty 30

## 2022-01-06 MED ORDER — GLYCOPYRROLATE 0.2 MG/ML IJ SOLN
INTRAMUSCULAR | Status: DC | PRN
  Administered 2022-01-06: .2 mg via INTRAVENOUS

## 2022-01-06 MED ORDER — ONDANSETRON HCL 4 MG PO TABS: 4.0000 mg | ORAL_TABLET | Freq: Four times a day (QID) | ORAL | Status: DC | PRN

## 2022-01-06 MED ORDER — MENTHOL 3 MG MT LOZG
1.0000 | LOZENGE | OROMUCOSAL | Status: DC | PRN
  Filled 2022-01-06: qty 9

## 2022-01-06 MED ORDER — ALUM & MAG HYDROXIDE-SIMETH 200-200-20 MG/5ML PO SUSP: 30.0000 mL | Freq: Four times a day (QID) | ORAL | Status: DC | PRN

## 2022-01-06 MED ORDER — SUGAMMADEX SODIUM 200 MG/2ML IV SOLN
INTRAVENOUS | Status: DC | PRN
  Administered 2022-01-06: 200 mg via INTRAVENOUS

## 2022-01-06 MED ORDER — DOCUSATE SODIUM 100 MG PO CAPS
100.0000 mg | ORAL_CAPSULE | Freq: Two times a day (BID) | ORAL | Status: DC
  Administered 2022-01-06 (×2): 100 mg via ORAL
  Filled 2022-01-06 (×2): qty 1

## 2022-01-06 MED ORDER — ACETAMINOPHEN 325 MG PO TABS: 650.0000 mg | ORAL_TABLET | ORAL | Status: DC | PRN

## 2022-01-06 MED ORDER — SODIUM CHLORIDE 0.9 % IV SOLN: 250.0000 mL | INTRAVENOUS | Status: DC

## 2022-01-06 MED ORDER — DIPHENHYDRAMINE HCL 50 MG/ML IJ SOLN
INTRAMUSCULAR | Status: DC | PRN
  Administered 2022-01-06: 12.5 mg via INTRAVENOUS

## 2022-01-06 MED ORDER — SURGIFLO WITH THROMBIN (HEMOSTATIC MATRIX KIT) OPTIME
TOPICAL | Status: DC | PRN
  Administered 2022-01-06: 1 via TOPICAL

## 2022-01-06 MED ORDER — BUPIVACAINE LIPOSOME 1.3 % IJ SUSP
INTRAMUSCULAR | Status: DC | PRN
  Administered 2022-01-06: 20 mL

## 2022-01-06 MED ORDER — EPHEDRINE SULFATE (PRESSORS) 50 MG/ML IJ SOLN
INTRAMUSCULAR | Status: DC | PRN
  Administered 2022-01-06: 10 mg via INTRAVENOUS

## 2022-01-06 MED ORDER — ONDANSETRON HCL 4 MG/2ML IJ SOLN
INTRAMUSCULAR | Status: DC | PRN
  Administered 2022-01-06: 4 mg via INTRAVENOUS

## 2022-01-06 MED ORDER — BUPIVACAINE-EPINEPHRINE 0.25% -1:200000 IJ SOLN
INTRAMUSCULAR | Status: DC | PRN
  Administered 2022-01-06: 30 mL

## 2022-01-06 MED ORDER — ATORVASTATIN CALCIUM 40 MG PO TABS: 40.0000 mg | ORAL_TABLET | Freq: Every day | ORAL | Status: DC

## 2022-01-06 MED ORDER — ZOLPIDEM TARTRATE 5 MG PO TABS: 5.0000 mg | ORAL_TABLET | Freq: Every evening | ORAL | Status: DC | PRN

## 2022-01-06 MED ORDER — FENTANYL CITRATE (PF) 250 MCG/5ML IJ SOLN
INTRAMUSCULAR | Status: DC | PRN
  Administered 2022-01-06 (×3): 50 ug via INTRAVENOUS
  Administered 2022-01-06: 100 ug via INTRAVENOUS

## 2022-01-06 MED ORDER — CEFAZOLIN SODIUM-DEXTROSE 2-4 GM/100ML-% IV SOLN
2.0000 g | Freq: Three times a day (TID) | INTRAVENOUS | Status: AC
  Administered 2022-01-06 (×2): 2 g via INTRAVENOUS
  Filled 2022-01-06 (×2): qty 100

## 2022-01-06 MED ORDER — CHLORHEXIDINE GLUCONATE 0.12 % MT SOLN
15.0000 mL | Freq: Once | OROMUCOSAL | Status: AC
  Administered 2022-01-06: 15 mL via OROMUCOSAL

## 2022-01-06 MED ORDER — FLEET ENEMA 7-19 GM/118ML RE ENEM: 1.0000 | ENEMA | Freq: Once | RECTAL | Status: DC | PRN

## 2022-01-06 MED ORDER — THROMBIN 20000 UNITS EX SOLR
CUTANEOUS | Status: DC | PRN
  Administered 2022-01-06: 20000 [IU] via TOPICAL

## 2022-01-06 MED ORDER — 0.9 % SODIUM CHLORIDE (POUR BTL) OPTIME
TOPICAL | Status: DC | PRN
  Administered 2022-01-06: 1000 mL

## 2022-01-06 MED ORDER — CEFAZOLIN SODIUM-DEXTROSE 2-4 GM/100ML-% IV SOLN
2.0000 g | INTRAVENOUS | Status: AC
  Administered 2022-01-06 (×2): 2 g via INTRAVENOUS

## 2022-01-06 MED ORDER — LISINOPRIL-HYDROCHLOROTHIAZIDE 20-12.5 MG PO TABS: 1.0000 | ORAL_TABLET | Freq: Every day | ORAL | Status: DC

## 2022-01-06 MED ORDER — SODIUM CHLORIDE 0.9% FLUSH: 3.0000 mL | INTRAVENOUS | Status: DC | PRN

## 2022-01-06 MED ORDER — MIDAZOLAM HCL 2 MG/2ML IJ SOLN
INTRAMUSCULAR | Status: AC
  Filled 2022-01-06: qty 2

## 2022-01-06 MED ORDER — MIDAZOLAM HCL 2 MG/2ML IJ SOLN
INTRAMUSCULAR | Status: DC | PRN
  Administered 2022-01-06: 2 mg via INTRAVENOUS

## 2022-01-06 MED ORDER — POVIDONE-IODINE 7.5 % EX SOLN
Freq: Once | CUTANEOUS | Status: DC
  Filled 2022-01-06: qty 118

## 2022-01-06 MED ORDER — DIPHENHYDRAMINE HCL 50 MG/ML IJ SOLN
INTRAMUSCULAR | Status: AC
  Filled 2022-01-06: qty 1

## 2022-01-06 MED ORDER — ACETAMINOPHEN 10 MG/ML IV SOLN
INTRAVENOUS | Status: AC
  Filled 2022-01-06: qty 100

## 2022-01-06 MED ORDER — SODIUM CHLORIDE 0.9% FLUSH
3.0000 mL | Freq: Two times a day (BID) | INTRAVENOUS | Status: DC
  Administered 2022-01-06: 3 mL via INTRAVENOUS

## 2022-01-06 MED ORDER — ORAL CARE MOUTH RINSE: 15.0000 mL | Freq: Once | OROMUCOSAL | Status: AC

## 2022-01-06 MED ORDER — ALPRAZOLAM 0.5 MG PO TABS
1.0000 mg | ORAL_TABLET | Freq: Two times a day (BID) | ORAL | Status: DC | PRN
  Administered 2022-01-06: 1 mg via ORAL
  Filled 2022-01-06: qty 2

## 2022-01-06 MED ORDER — EPHEDRINE 5 MG/ML INJ
INTRAVENOUS | Status: AC
  Filled 2022-01-06: qty 5

## 2022-01-06 MED ORDER — PROPOFOL 10 MG/ML IV BOLUS
INTRAVENOUS | Status: DC | PRN
  Administered 2022-01-06: 20 mg via INTRAVENOUS
  Administered 2022-01-06: 110 mg via INTRAVENOUS

## 2022-01-06 MED ORDER — LIDOCAINE 2% (20 MG/ML) 5 ML SYRINGE
INTRAMUSCULAR | Status: DC | PRN
  Administered 2022-01-06: 100 mg via INTRAVENOUS

## 2022-01-06 MED ORDER — KETAMINE HCL 10 MG/ML IJ SOLN
INTRAMUSCULAR | Status: DC | PRN
Start: 2022-01-06 — End: 2022-01-06
  Administered 2022-01-06: 10 mg via INTRAVENOUS
  Administered 2022-01-06: 40 mg via INTRAVENOUS

## 2022-01-06 MED ORDER — MEPERIDINE HCL 25 MG/ML IJ SOLN: 6.2500 mg | INTRAMUSCULAR | Status: DC | PRN

## 2022-01-06 MED ORDER — CITALOPRAM HYDROBROMIDE 20 MG PO TABS
40.0000 mg | ORAL_TABLET | Freq: Every day | ORAL | Status: DC
Start: 2022-01-07 — End: 2022-01-07

## 2022-01-06 MED ORDER — PHENYLEPHRINE 40 MCG/ML (10ML) SYRINGE FOR IV PUSH (FOR BLOOD PRESSURE SUPPORT)
PREFILLED_SYRINGE | INTRAVENOUS | Status: AC
  Filled 2022-01-06: qty 10

## 2022-01-06 MED ORDER — DEXAMETHASONE SODIUM PHOSPHATE 10 MG/ML IJ SOLN
INTRAMUSCULAR | Status: AC
  Filled 2022-01-06: qty 1

## 2022-01-06 MED ORDER — THROMBIN 20000 UNITS EX SOLR
CUTANEOUS | Status: AC
  Filled 2022-01-06: qty 20000

## 2022-01-06 MED ORDER — MAGNESIUM 200 MG PO TABS
600.0000 mg | ORAL_TABLET | Freq: Every day | ORAL | Status: DC
  Filled 2022-01-06: qty 3

## 2022-01-06 MED ORDER — ONDANSETRON HCL 4 MG/2ML IJ SOLN
INTRAMUSCULAR | Status: AC
  Filled 2022-01-06: qty 2

## 2022-01-06 MED ORDER — SENNOSIDES-DOCUSATE SODIUM 8.6-50 MG PO TABS: 1.0000 | ORAL_TABLET | Freq: Every evening | ORAL | Status: DC | PRN

## 2022-01-06 MED ORDER — OXYCODONE-ACETAMINOPHEN 5-325 MG PO TABS
1.0000 | ORAL_TABLET | ORAL | Status: DC | PRN
  Administered 2022-01-06 – 2022-01-07 (×5): 2 via ORAL
  Filled 2022-01-06 (×5): qty 2

## 2022-01-06 MED ORDER — POTASSIUM CHLORIDE IN NACL 20-0.9 MEQ/L-% IV SOLN: INTRAVENOUS | Status: DC

## 2022-01-06 MED ORDER — ONDANSETRON HCL 4 MG/2ML IJ SOLN: 4.0000 mg | Freq: Four times a day (QID) | INTRAMUSCULAR | Status: DC | PRN

## 2022-01-06 MED ORDER — METHOCARBAMOL 1000 MG/10ML IJ SOLN
500.0000 mg | Freq: Four times a day (QID) | INTRAVENOUS | Status: DC | PRN
  Filled 2022-01-06: qty 5

## 2022-01-06 MED ORDER — BUPIVACAINE LIPOSOME 1.3 % IJ SUSP
INTRAMUSCULAR | Status: AC
  Filled 2022-01-06: qty 20

## 2022-01-06 MED ORDER — ONDANSETRON HCL 4 MG/2ML IJ SOLN: 4.0000 mg | Freq: Once | INTRAMUSCULAR | Status: DC | PRN

## 2022-01-06 MED ORDER — PHENYLEPHRINE HCL-NACL 20-0.9 MG/250ML-% IV SOLN
INTRAVENOUS | Status: DC | PRN
  Administered 2022-01-06: 25 ug/min via INTRAVENOUS

## 2022-01-06 MED ORDER — PHENOL 1.4 % MT LIQD
1.0000 | OROMUCOSAL | Status: DC | PRN
Start: 2022-01-06 — End: 2022-01-07

## 2022-01-06 MED ORDER — FENTANYL CITRATE (PF) 100 MCG/2ML IJ SOLN
25.0000 ug | INTRAMUSCULAR | Status: DC | PRN
  Administered 2022-01-06: 50 ug via INTRAVENOUS

## 2022-01-06 MED ORDER — CHLORHEXIDINE GLUCONATE 0.12 % MT SOLN
OROMUCOSAL | Status: AC
  Filled 2022-01-06: qty 15

## 2022-01-06 MED ORDER — MAGNESIUM OXIDE -MG SUPPLEMENT 400 (240 MG) MG PO TABS
400.0000 mg | ORAL_TABLET | Freq: Every day | ORAL | Status: DC
  Administered 2022-01-06: 400 mg via ORAL
  Filled 2022-01-06: qty 1

## 2022-01-06 MED ORDER — FENTANYL CITRATE (PF) 250 MCG/5ML IJ SOLN
INTRAMUSCULAR | Status: AC
  Filled 2022-01-06: qty 5

## 2022-01-06 SURGICAL SUPPLY — 89 items
BAG COUNTER SPONGE SURGICOUNT (BAG) ×2 IMPLANT
BENZOIN TINCTURE PRP APPL 2/3 (GAUZE/BANDAGES/DRESSINGS) ×3 IMPLANT
BLADE CLIPPER SURG (BLADE) IMPLANT
BUR PRESCISION 1.7 ELITE (BURR) ×2 IMPLANT
BUR ROUND FLUTED 5 RND (BURR) ×2 IMPLANT
BUR ROUND PRECISION 4.0 (BURR) IMPLANT
BUR SABER RD CUTTING 3.0 (BURR) IMPLANT
CAGE SABLE 10X30 9-16 8D (Cage) ×1 IMPLANT
CARTRIDGE OIL MAESTRO DRILL (MISCELLANEOUS) ×1 IMPLANT
CLSR STERI-STRIP ANTIMIC 1/2X4 (GAUZE/BANDAGES/DRESSINGS) ×2 IMPLANT
CNTNR URN SCR LID CUP LEK RST (MISCELLANEOUS) ×1 IMPLANT
CONT SPEC 4OZ STRL OR WHT (MISCELLANEOUS) ×4
COVER BACK TABLE 60X90IN (DRAPES) ×2 IMPLANT
COVER MAYO STAND STRL (DRAPES) ×4 IMPLANT
COVER SURGICAL LIGHT HANDLE (MISCELLANEOUS) ×2 IMPLANT
DIFFUSER DRILL AIR PNEUMATIC (MISCELLANEOUS) ×2 IMPLANT
DRAIN CHANNEL 15F RND FF W/TCR (WOUND CARE) IMPLANT
DRAPE C-ARM 42X72 X-RAY (DRAPES) ×2 IMPLANT
DRAPE C-ARMOR (DRAPES) IMPLANT
DRAPE POUCH INSTRU U-SHP 10X18 (DRAPES) ×2 IMPLANT
DRAPE SURG 17X23 STRL (DRAPES) ×8 IMPLANT
DURAPREP 26ML APPLICATOR (WOUND CARE) ×2 IMPLANT
ELECT BLADE 4.0 EZ CLEAN MEGAD (MISCELLANEOUS) ×2
ELECT CAUTERY BLADE 6.4 (BLADE) ×2 IMPLANT
ELECT REM PT RETURN 9FT ADLT (ELECTROSURGICAL) ×2
ELECTRODE BLDE 4.0 EZ CLN MEGD (MISCELLANEOUS) ×1 IMPLANT
ELECTRODE REM PT RTRN 9FT ADLT (ELECTROSURGICAL) ×1 IMPLANT
EVACUATOR SILICONE 100CC (DRAIN) IMPLANT
FILTER STRAW FLUID ASPIR (MISCELLANEOUS) ×2 IMPLANT
GAUZE 4X4 16PLY ~~LOC~~+RFID DBL (SPONGE) ×2 IMPLANT
GAUZE SPONGE 4X4 12PLY STRL (GAUZE/BANDAGES/DRESSINGS) ×3 IMPLANT
GLOVE SRG 8 PF TXTR STRL LF DI (GLOVE) ×1 IMPLANT
GLOVE SURG ENC MOIS LTX SZ6.5 (GLOVE) ×2 IMPLANT
GLOVE SURG ENC MOIS LTX SZ8 (GLOVE) ×2 IMPLANT
GLOVE SURG LTX SZ7.5 (GLOVE) ×1 IMPLANT
GLOVE SURG POLYISO LF SZ6.5 (GLOVE) ×5 IMPLANT
GLOVE SURG UNDER POLY LF SZ7 (GLOVE) ×2 IMPLANT
GLOVE SURG UNDER POLY LF SZ8 (GLOVE) ×2
GOWN STRL REUS W/ TWL LRG LVL3 (GOWN DISPOSABLE) ×2 IMPLANT
GOWN STRL REUS W/ TWL XL LVL3 (GOWN DISPOSABLE) ×1 IMPLANT
GOWN STRL REUS W/TWL LRG LVL3 (GOWN DISPOSABLE) ×4
GOWN STRL REUS W/TWL XL LVL3 (GOWN DISPOSABLE) ×2
IV CATH 14GX2 1/4 (CATHETERS) ×2 IMPLANT
KIT BASIN OR (CUSTOM PROCEDURE TRAY) ×2 IMPLANT
KIT NEURO ACCESSORY W/WRENCH (MISCELLANEOUS) ×1 IMPLANT
KIT POSITION SURG JACKSON T1 (MISCELLANEOUS) ×2 IMPLANT
KIT TURNOVER KIT B (KITS) ×2 IMPLANT
MARKER SKIN DUAL TIP RULER LAB (MISCELLANEOUS) ×4 IMPLANT
MIX DBX 10CC 35% BONE (Bone Implant) ×1 IMPLANT
NDL 18GX1X1/2 (RX/OR ONLY) (NEEDLE) ×1 IMPLANT
NDL HYPO 25GX1X1/2 BEV (NEEDLE) ×1 IMPLANT
NDL SPNL 18GX3.5 QUINCKE PK (NEEDLE) ×2 IMPLANT
NEEDLE 18GX1X1/2 (RX/OR ONLY) (NEEDLE) ×2 IMPLANT
NEEDLE 22X1 1/2 (OR ONLY) (NEEDLE) ×4 IMPLANT
NEEDLE HYPO 25GX1X1/2 BEV (NEEDLE) ×2 IMPLANT
NEEDLE SPNL 18GX3.5 QUINCKE PK (NEEDLE) ×4 IMPLANT
NS IRRIG 1000ML POUR BTL (IV SOLUTION) ×2 IMPLANT
OIL CARTRIDGE MAESTRO DRILL (MISCELLANEOUS) ×2
PACK LAMINECTOMY ORTHO (CUSTOM PROCEDURE TRAY) ×2 IMPLANT
PACK UNIVERSAL I (CUSTOM PROCEDURE TRAY) ×2 IMPLANT
PAD ARMBOARD 7.5X6 YLW CONV (MISCELLANEOUS) ×4 IMPLANT
PATTIES SURGICAL .5 X1 (DISPOSABLE) ×2 IMPLANT
PATTIES SURGICAL .5X1.5 (GAUZE/BANDAGES/DRESSINGS) ×2 IMPLANT
PUTTY BONE DBX 2.5 MIS (Bone Implant) ×1 IMPLANT
ROD PRE BENT EXP 40MM (Rod) ×2 IMPLANT
SCREW CORTICAL VIPER 7X35 (Screw) ×1 IMPLANT
SCREW SET SINGLE INNER (Screw) ×4 IMPLANT
SCREW VIPER CORT FIX 6X35 (Screw) ×2 IMPLANT
SPONGE INTESTINAL PEANUT (DISPOSABLE) ×2 IMPLANT
SPONGE SURGIFOAM ABS GEL 100 (HEMOSTASIS) ×2 IMPLANT
SPONGE T-LAP 4X18 ~~LOC~~+RFID (SPONGE) ×1 IMPLANT
STRIP CLOSURE SKIN 1/2X4 (GAUZE/BANDAGES/DRESSINGS) ×4 IMPLANT
SURGIFLO W/THROMBIN 8M KIT (HEMOSTASIS) ×1 IMPLANT
SUT MNCRL AB 4-0 PS2 18 (SUTURE) ×3 IMPLANT
SUT VIC AB 0 CT1 18XCR BRD 8 (SUTURE) ×1 IMPLANT
SUT VIC AB 0 CT1 8-18 (SUTURE) ×4
SUT VIC AB 1 CT1 18XCR BRD 8 (SUTURE) ×1 IMPLANT
SUT VIC AB 1 CT1 8-18 (SUTURE) ×4
SUT VIC AB 2-0 CT2 18 VCP726D (SUTURE) ×3 IMPLANT
SYR 20ML LL LF (SYRINGE) ×4 IMPLANT
SYR BULB IRRIG 60ML STRL (SYRINGE) ×2 IMPLANT
SYR CONTROL 10ML LL (SYRINGE) ×4 IMPLANT
SYR TB 1ML LUER SLIP (SYRINGE) ×2 IMPLANT
TAP EXPEDIUM DL 5.0 (INSTRUMENTS) ×1 IMPLANT
TAP EXPEDIUM DL 6.0 (INSTRUMENTS) ×1 IMPLANT
TAPE CLOTH SURG 6X10 WHT LF (GAUZE/BANDAGES/DRESSINGS) ×2 IMPLANT
TRAY FOLEY MTR SLVR 16FR STAT (SET/KITS/TRAYS/PACK) ×2 IMPLANT
WATER STERILE IRR 1000ML POUR (IV SOLUTION) ×2 IMPLANT
YANKAUER SUCT BULB TIP NO VENT (SUCTIONS) ×2 IMPLANT

## 2022-01-06 NOTE — H&P (Signed)
PREOPERATIVE H&P  Chief Complaint: Right leg pain  HPI: Melanie Avila is a 61 y.o. female who presents with ongoing pain in the right leg  MRI reveals a large cyst and severe stenosis at L4/5  Patient has failed multiple forms of conservative care and continues to have pain (see office notes for additional details regarding the patient's full course of treatment)  Past Medical History:  Diagnosis Date   Anemia    Anxiety    Complication of anesthesia    last shoulder surg 2010-[facility]-2 hr after block to surg-neck swelled-hard to tube-stayed RCC   DDD (degenerative disc disease)    Depression    Fibromyalgia    GERD (gastroesophageal reflux disease)    Headache    migraines in her younger years   Heart murmur    since birth and never has been a problem   History of kidney stones    7 times   Hyperlipidemia    Hypertension    Neuromuscular disorder ([place])    nerve damage left leg   PONV (postoperative nausea and vomiting)    shorter surgeries tends to have n/v.    Pre-diabetes    Past Surgical History:  Procedure Laterality Date   BACK SURGERY  89,02   lumbar x2   CARPAL TUNNEL RELEASE Bilateral    COLONOSCOPY N/A 12/09/2015   Procedure: COLONOSCOPY;  Surgeon: Rogene Houston, MD;  [facility];  Service: Endoscopy;  Laterality: N/A;  830   SHOULDER ARTHROSCOPY WITH SUBACROMIAL DECOMPRESSION  10/23/2012   Procedure: SHOULDER ARTHROSCOPY WITH SUBACROMIAL DECOMPRESSION;  Surgeon: Cammie Sickle., MD;  Location: [place];  Service: Orthopedics;  Laterality: Left;  LEFT SHOULDER  ARTHROSCOPY WITH SUBACROMIAL DECOMPRESSION, DISTAL CLAVICLE RESECTION, ARTHROSCOPIC SUBSCAPULARIS REPAIR AND OPEN REPAIR OF SUPRASPINATOUS TENDON   SHOULDER SURGERY Bilateral    TUBAL LIGATION     Social History   Socioeconomic History   Marital status: Married    Spouse name: Tommy   Number of children: 2   Years of education: 12   Highest education level:  Not on file  Occupational History   Not on file  Tobacco Use   Smoking status: Former    Packs/day: 1.00    Years: 35.00    Pack years: 35.00    Types: Cigarettes    Quit date: 08/31/2021    Years since quitting: 0.3   Smokeless tobacco: Never   Tobacco comments:    down to 4 cigarettes (as of 10/31/16)  Vaping Use   Vaping Use: Former  Substance and Sexual Activity   Alcohol use: No   Drug use: No   Sexual activity: Not Currently  Other Topics Concern   Not on file  Social History Narrative   Patient lives at home with husband and son   Patient has 2 children    Patient has a high school education    Patient works has [employer]   Patient is right handed    Caffeine use: 1/2-1 cup coffee occasionally    Drinks tea occas   Social Determinants of Radio broadcast assistant Strain: Not on file  Food Insecurity: Not on file  Transportation Needs: Not on file  Physical Activity: Not on file  Stress: Not on file  Social Connections: Not on file   Family History  Problem Relation Age of Onset   Alzheimer's disease Mother    Prostate cancer Father    Leukemia Father  Heart attack Maternal Aunt    Heart attack Maternal Uncle    Diabetes Cousin    Diabetes Cousin    Allergies  Allergen Reactions   Cymbalta [Duloxetine Hcl]     Suicidal thoughts   Effexor [Venlafaxine Hydrochloride] Other (See Comments)    Suicidal thoughts   Sulfa Antibiotics Swelling   Lyrica [Pregabalin] Other (See Comments)    dizziness   Morphine And Related Other (See Comments)    Makes her constipated and does not help the pain   Prior to Admission medications   Medication Sig Start Date End Date Taking? Authorizing Provider  ALPRAZolam Duanne Moron) 1 MG tablet Take 1 mg by mouth 2 (two) times daily as needed for anxiety.    Yes [provider]  atorvastatin (LIPITOR) 40 MG tablet Take 40 mg by mouth daily.   Yes [provider]  buPROPion (WELLBUTRIN XL) 150 MG 24 hr tablet  Take 150 mg by mouth daily.   Yes [provider]  carisoprodol (SOMA) 350 MG tablet Take 350 mg by mouth 3 (three) times daily as needed for muscle spasms.   Yes [provider]  Cholecalciferol (DIALYVITE VITAMIN D 5000) 125 MCG (5000 UT) capsule Take 5,000 Units by mouth daily.   Yes [provider]  citalopram (CELEXA) 40 MG tablet Take 40 mg by mouth daily.   Yes [provider]  fenofibrate 160 MG tablet Take 160 mg by mouth daily.   Yes [provider]  gabapentin (NEURONTIN) 300 MG capsule Take 300 mg by mouth 2 (two) times daily.   Yes [provider]  HYDROcodone-acetaminophen (NORCO) 10-325 MG tablet Take 1 tablet by mouth 5 (five) times daily as needed for moderate pain.   Yes [provider]  lisinopril-hydrochlorothiazide (ZESTORETIC) 20-12.5 MG tablet Take 1 tablet by mouth daily.   Yes [provider]  MAGNESIUM PO Take 600 mg by mouth daily.   Yes [provider]  naloxone (NARCAN) nasal spray 4 mg/0.1 mL Place 1 spray into the nose once.   Yes [provider]  Phenylephrine-Acetaminophen (TYLENOL SINUS CONGESTION/PAIN PO) Take 2 tablets by mouth daily as needed (congestion / pain).   Yes [provider]  diclofenac (VOLTAREN) 75 MG EC tablet Take 75 mg by mouth 2 (two) times daily.    [provider]     All other systems have been reviewed and were otherwise negative with the exception of those mentioned in the HPI and as above.  Physical Exam: Vitals:   01/06/22 0551  BP: 124/74  Pulse: 72  Resp: 18  Temp: 97.7 F (36.5 C)  SpO2: 96%    Body mass index is 32.44 kg/m.  General: Alert, no acute distress Cardiovascular: No pedal edema Respiratory: No cyanosis, no use of accessory musculature Skin: No lesions in the area of chief complaint Neurologic: Sensation intact distally Psychiatric: Patient is competent for consent with normal mood and  affect Lymphatic: No axillary or cervical lymphadenopathy  MUSCULOSKELETAL: + SLR on the right  Assessment/Plan: Chronic right L5 radiculopathy secondary to large right-sided L4-L5 facet cyst, the level above the patient's previous L5-S1 fusion. Plan for Procedure(s): RIGHT-SIDED LUMBAR 4 - LUMBAR 5 TRANSFORAMINAL LUMBAR INTERBODY FUSION WITH INSTRUMENTATION AND ALLOGRAFT, REMOVAL OF SPINAL CORD STIMULATOR BATTERY.   Norva Karvonen, MD 01/06/2022 6:21 AM

## 2022-01-06 NOTE — Op Note (Signed)
PATIENT NAME: Melanie Avila   MEDICAL RECORD NO.:   [id number]   DATE OF BIRTH: May 04, 1961   DATE OF PROCEDURE: 01/06/2022                                OPERATIVE REPORT     PREOPERATIVE DIAGNOSES: 1. Right-sided lumbar radiculopathy. 2. Severe L4-5 spinal stenosis. 3. s/p previous L5/S1 fusion 4. Painful spinal cord stimulator battery   POSTOPERATIVE DIAGNOSES: 1. Right-sided lumbar radiculopathy. 2. Severe L4-5 spinal stenosis. 3. s/p previous L5/S1 fusion 4. Painful spinal cord stimulator battery   PROCEDURES: 1. L4/5 decompression 2. Right-sided L4-5 transforaminal lumbar interbody fusion. 3. Left-sided L4-5 posterolateral fusion. 4. Insertion of interbody device x1 (Globus expandable intervertebral spacer). 5. Placement of posterior instrumentation (placement of bilateral L4 pedicle screws, connected to previously placed hardware at L5) 6.  Removal of bilateral S1 pedicle screws 7. Use of local autograft. 8. Use of morselized allograft - DBX-mix 9. Intraoperative use of fluoroscopy. 10. Removal of spinal cord stimulator battery   SURGEON:  Phylliss Bob, MD.   ASSISTANTPricilla Holm, PA-C.   ANESTHESIA:  General endotracheal anesthesia.   COMPLICATIONS:  None.   DISPOSITION:  Stable.   ESTIMATED BLOOD LOSS:  100cc   INDICATIONS FOR SURGERY:  Briefly,  Melanie Avila is a pleasant 61 year old female who did present to me with severe and ongoing pain in the right leg. I did feel that the symptoms were secondary to the findings noted above.  The patient failed conservative care and did wish to proceed with the procedure  noted above.   OPERATIVE DETAILS:  On 01/06/2022, the patient was brought to surgery and general endotracheal anesthesia was administered.  The patient was placed prone on a well-padded flat Jackson bed with a spinal frame.  Antibiotics were given and a time-out procedure was performed. The back was prepped and draped in the usual fashion.  A  midline incision was made overlying the L4-5 and L5-S1 intervertebral spaces.  The fascia was incised at the midline.  The paraspinal musculature was bluntly swept laterally.  The previously placed hardware spanning L5 and S1 was identified.  The caps overlying the screws were removed, as were the bilateral interconnecting rods.  There was clearly a successful fusion noted.  The bilateral S1 pedicle screw was removed, and bone wax was placed in their place.  The right L5 pedicle screw was removed, and upsized to a 7 x 35 mm screw. At this point, anatomic landmarks for the L4 pedicles were exposed. Using fluoroscopy, I did cannulate the L4 pedicles bilaterally, using a medial to lateral cortical trajectory technique.  At this point, 6 x 35 mm screws were placed into the left pedicle, and a 40 mm rod was placed into the tulip heads of the screws on the left, and caps were also placed, spanning L4-5.  Distraction was then applied across the L4-5 intervertebral space, and the caps were then provisionally tightened.  On the right side, bone wax was placed into the L4 cannulated pedicle hole.  I then proceeded with the decompressive aspect of the procedure at the L4-5 level.  A partial facetectomy was performed bilaterally at L4-5, decompressing the L4-5 intervertebral space.  A prominent right-sided L4-5 facet cyst was noted, severely compressing the right L5 nerve.  I did meticulously tease the cyst off of the nerve and dural sac, after which point it was removed in its entirety.  Small insignificant portions of the cyst that were adherent to the dura remained, however these were noncompressive. This did entirely decompress the spinal canal at L4-5. I was very pleased with the decompression. With an assistant holding medial retraction of the traversing right L5 nerve, I did perform an annulotomy at the posterolateral aspect of the L4-5 intervertebral space.  I then used a series of curettes and pituitary  rongeurs to perform a thorough and complete intervertebral diskectomy.  The intervertebral space was then liberally packed with autograft as well as allograft in the form of DBX-mix, as was the appropriate-sized intervertebral spacer.  The spacer was then tamped into position in the usual fashion, and expanded to approximately 13 mm in height.  I was very pleased with the press-fit of the spacer.  I then placed a 6 mm screw on the right at L4. A 40-mm rod was then placed into the tulip heads of L4 and L5, and caps were placed. The distraction was then released on the contralateral side.  All caps were then locked.  The wound was copiously irrigated with a total of approximately 3 L prior to placing the bone graft.  Additional autograft and allograft was then packed into the posterolateral gutter on the left side to help aid in the success of the fusion.  The wound was explored for any undue bleeding and there was no substantial bleeding encountered.  Gel-Foam was placed over the laminectomy site.  The wound was then closed in layers using #1 Vicryl followed by 2-0 Vicryl, followed by 4-0 Monocryl.    At this point, I turned my attention to the region of the spinal cord stimulator battery, located at the patient's right flank.  The transverse scar just above the spinal cord stimulator battery was utilized, and an incision was made over this region.  The subcutaneous layer was noted, as was the spinal cord stimulator battery.  Using a screwdriver, the connection between the leads and the battery was loosened, and the leads were disconnected from the battery.  The battery was removed, and the leads were placed in the deep subcutaneous layer.  The wound was then irrigated thoroughly and closed using 2-0 Vicryl followed by 4-0 Monocryl.  Benzoin and Steri-Strips were applied to both wounds, followed by a sterile dressing.      Of note, Pricilla Holm was my assistant throughout surgery, and did aid in  retraction, suctioning, placement of the hardware, and closure.     Phylliss Bob, MD

## 2022-01-06 NOTE — Transfer of Care (Addendum)
Immediate Anesthesia Transfer of Care Note  Patient: Melanie Avila  Procedure(s) Performed: RIGHT-SIDED LUMBAR 4 - LUMBAR 5 TRANSFORAMINAL LUMBAR INTERBODY FUSION WITH INSTRUMENTATION AND ALLOGRAFT, REMOVAL OF SPINAL CORD STIMULATOR BATTERY. (Right: Back)  Patient Location: PACU  Anesthesia Type:General  Level of Consciousness: drowsy, patient cooperative and responds to stimulation  Airway & Oxygen Therapy: Patient Spontanous Breathing  Post-op Assessment: Report given to RN and Post -op Vital signs reviewed and stable  Post vital signs: Reviewed and stable  Last Vitals:  Vitals Value Taken Time  BP 121/77 01/06/22 1250  Temp 36.5 C 01/06/22 1250  Pulse 74 01/06/22 1301  Resp 13 01/06/22 1301  SpO2 93 % 01/06/22 1301  Vitals shown include unvalidated device data.  Last Pain:  Vitals:   01/06/22 1250  TempSrc:   PainSc: Asleep      Patients Stated Pain Goal: 2 (01/06/22 0603)  Complications: No notable events documented.

## 2022-01-06 NOTE — Anesthesia Procedure Notes (Signed)
Procedure Name: Intubation Date/Time: 01/06/2022 7:56 AM Performed by: Epifanio Lesches, CRNA Pre-anesthesia Checklist: Patient identified, Emergency Drugs available, Suction available and Patient being monitored Patient Re-evaluated:Patient Re-evaluated prior to induction Oxygen Delivery Method: Circle System Utilized Preoxygenation: Pre-oxygenation with 100% oxygen Induction Type: IV induction Ventilation: Mask ventilation without difficulty and Oral airway inserted - appropriate to patient size Laryngoscope Size: Glidescope and 3 Grade View: Grade I Tube type: Oral Tube size: 7.0 mm Number of attempts: 1 Airway Equipment and Method: Stylet and Oral airway Placement Confirmation: ETT inserted through vocal cords under direct vision, positive ETCO2 and breath sounds checked- equal and bilateral Secured at: 21 cm Tube secured with: Tape Dental Injury: Teeth and Oropharynx as per pre-operative assessment  Difficulty Due To: Difficult Airway- due to anterior larynx

## 2022-01-06 NOTE — Anesthesia Postprocedure Evaluation (Signed)
Anesthesia Post Note  Patient: Melanie Avila  Procedure(s) Performed: RIGHT-SIDED LUMBAR 4 - LUMBAR 5 TRANSFORAMINAL LUMBAR INTERBODY FUSION WITH INSTRUMENTATION AND ALLOGRAFT, REMOVAL OF SPINAL CORD STIMULATOR BATTERY. (Right: Back)     Patient location during evaluation: PACU Anesthesia Type: General Level of consciousness: awake and alert Pain management: pain level controlled Vital Signs Assessment: post-procedure vital signs reviewed and stable Respiratory status: spontaneous breathing, nonlabored ventilation, respiratory function stable and patient connected to nasal cannula oxygen Cardiovascular status: blood pressure returned to baseline and stable Postop Assessment: no apparent nausea or vomiting Anesthetic complications: no   No notable events documented.  Last Vitals:  Vitals:   01/06/22 1320 01/06/22 1335  BP: (!) 122/52 (!) 101/56  Pulse: 75 77  Resp: 12 15  Temp:    SpO2: 96% 93%    Last Pain:  Vitals:   01/06/22 1320  TempSrc:   PainSc: Asleep                 Maresha Anastos

## 2022-01-07 MED ORDER — OXYCODONE-ACETAMINOPHEN 5-325 MG PO TABS: 1.0000 | ORAL_TABLET | ORAL | 0 refills | Status: DC | PRN

## 2022-01-07 MED ORDER — METHOCARBAMOL 500 MG PO TABS: 500.0000 mg | ORAL_TABLET | Freq: Four times a day (QID) | ORAL | 2 refills | Status: DC | PRN

## 2022-01-07 NOTE — Progress Notes (Signed)
PT Cancellation Note and Discharge  Patient Details Name: Melanie Avila MRN: [id number] DOB: May 23, 1961   Cancelled Treatment:    Reason Eval/Treat Not Completed: PT screened, no needs identified, will sign off. Discussed pt case with OT who reports that pt is independent with all mobility including stair negotiation. Will sign off at this time. If needs change, please reconsult.    Marylynn Pearson 01/07/2022, 8:59 AM  Conni Slipper, PT, DPT Acute Rehabilitation Services Pager: [phone] Office: [phone]

## 2022-01-07 NOTE — Evaluation (Signed)
Occupational Therapy Evaluation and Discharge Patient Details Name: Melanie Avila MRN: [id number] DOB: 08/31/1961 Today's Date: 01/07/2022   History of Present Illness Pt is a 61 yo female s/p L4/5 decompression, right-sided L4-5 transforaminal lumbar interbody fusion, and left-sided L4-5 posterolateral fusion.PHMx: anxiety, depression, fibromyalgia, several back sxs.   Clinical Impression   This 61 yo female admitted and underwent above presents to acute OT with all education completed. Pt Independent to Mod I with all basic ADLs (except for LBB/D--which she says family can A with prn).No PT needs identified--Independent to Mod I with all mobility (made them aware). Acute OT will sign off.     Recommendations for follow up therapy are one component of a multi-disciplinary discharge planning process, led by the attending physician.  Recommendations may be updated based on patient status, additional functional criteria and insurance authorization.   Follow Up Recommendations  No OT follow up    Assistance Recommended at Discharge PRN  Patient can return home with the following A little help with bathing/dressing/bathroom    Functional Status Assessment  Patient has had a recent decline in their functional status and demonstrates the ability to make significant improvements in function in a reasonable and predictable amount of time. (no follow up therapy recommeded (PT or OT))  Equipment Recommendations  None recommended by OT       Precautions / Restrictions Precautions Precautions: Back Precaution Booklet Issued: Yes (comment) Required Braces or Orthoses: Spinal Brace Spinal Brace: Thoracolumbosacral orthotic;Applied in sitting position;Applied in standing position      Mobility Bed Mobility Overal bed mobility: Modified Independent                  Transfers Overall transfer level: Independent                 General transfer comment: up/down 3 steps Mod I  (rail and one step at a time)      Balance Overall balance assessment: Independent                                         ADL either performed or assessed with clinical judgement   ADL Overall ADL's : Needs assistance/impaired Eating/Feeding: Independent;Sitting   Grooming: Modified independent;Standing Grooming Details (indicate cue type and reason): Educated to use 2 cups for brushing teeth to avoid bending over the sink Upper Body Bathing: Independent;Sitting;Standing   Lower Body Bathing: Moderate assistance Lower Body Bathing Details (indicate cue type and reason): independent with sit<>stand, family to A prn per pt Upper Body Dressing : Independent;Sitting;Standing   Lower Body Dressing: Moderate assistance Lower Body Dressing Details (indicate cue type and reason): independent with sit<>stand, family to A prn per pt Toilet Transfer: Modified Independent;Ambulation Toilet Transfer Details (indicate cue type and reason): No AD Toileting- Clothing Manipulation and Hygiene: Independent;Sit to/from stand Educated on use of wet wipes for back peri care  Educated to not sit for more than 20-30 minutes at a time initially and build up to sitting tolerance for 1 hour. Get up and walk around after sitting time.               Vision Patient Visual Report: No change from baseline              Pertinent Vitals/Pain Pain Assessment Pain Assessment: Faces Faces Pain Scale: Hurts a little bit Pain Location: incisional Pain Descriptors / Indicators:  Aching, Sore Pain Intervention(s): Limited activity within patient's tolerance, Monitored during session     Hand Dominance Right   Extremity/Trunk Assessment Upper Extremity Assessment Upper Extremity Assessment: Overall WFL for tasks assessed   Lower Extremity Assessment Lower Extremity Assessment: Overall WFL for tasks assessed       Communication Communication Communication: No difficulties    Cognition Arousal/Alertness: Awake/alert Behavior During Therapy: WFL for tasks assessed/performed Overall Cognitive Status: Within Functional Limits for tasks assessed                                                  Home Living Family/patient expects to be discharged to:: Private residence Living Arrangements: Spouse/significant other;Children Available Help at Discharge: Family;Available 24 hours/day Type of Home: House Home Access: Stairs to enter [employer] of Steps: 2 Entrance Stairs-Rails: Right;Left;Can reach both Home Layout: One level     Bathroom Shower/Tub: Producer, television/film/video: Handicapped height     Home Equipment: Cane - single point;Shower seat;Grab bars - tub/shower          Prior Functioning/Environment Prior Level of Function : Independent/Modified Independent                        OT Problem List: Decreased range of motion;Pain         OT Goals(Current goals can be found in the care plan section) Acute Rehab OT Goals Patient Stated Goal: to go home today         AM-PAC OT "6 Clicks" Daily Activity     Outcome Measure Help from another person eating meals?: None Help from another person taking care of personal grooming?: None Help from another person toileting, which includes using toliet, bedpan, or urinal?: None Help from another person bathing (including washing, rinsing, drying)?: A Lot Help from another person to put on and taking off regular upper body clothing?: None Help from another person to put on and taking off regular lower body clothing?: A Lot 6 Click Score: 20   End of Session Equipment Utilized During Treatment: Back brace Nurse Communication:  (pt ready to go from OT standpoint once MD sees pt, no PT needs identified,)  Activity Tolerance: Patient tolerated treatment well Patient left:  (up and about in room)  OT Visit Diagnosis: Pain Pain - part of body:  (incisional)                 Time: 1749-4496 OT Time Calculation (min): 15 min Charges:  OT General Charges $OT Visit: 1 Visit OT Evaluation $OT Eval Moderate Complexity: 1 Mod Melanie Avila, OTR/L Acute [employer] Pager [phone] Office [phone]    Melanie Avila 01/07/2022, 9:13 AM

## 2022-01-07 NOTE — Progress Notes (Signed)
° ° °  Patient doing well  Patient denies leg pain   Physical Exam: Vitals:   01/06/22 2313 01/07/22 0357  BP: (!) 109/59 (!) 109/53  Pulse: 85 84  Resp: 20 20  Temp: 99.5 F (37.5 C) 99 F (37.2 C)  SpO2: 94% 96%    Dressing in place NVI  POD #1 s/p L4/5 decompression and fusion, doing well  - up with PT/OT, encourage ambulation - Percocet for pain, Robaxin for muscle spasms - likely d/c home today with f/u in 2 weeks

## 2022-01-11 ENCOUNTER — Encounter (HOSPITAL_COMMUNITY): Payer: Self-pay | Admitting: Orthopedic Surgery

## 2022-01-19 NOTE — Discharge Summary (Signed)
Patient ID: Melanie Avila MRN: [id number] DOB/AGE: [birth year] 61 y.o.  Admit date: 01/06/2022 Discharge date: 01/07/2022  Admission Diagnoses:  Principal Problem:   Radiculopathy   Discharge Diagnoses:  Same  Past Medical History:  Diagnosis Date   Anemia    Anxiety    Complication of anesthesia    last shoulder surg 2010-dsc-2 hr after block to surg-neck swelled-hard to tube-stayed RCC   DDD (degenerative disc disease)    Depression    Fibromyalgia    GERD (gastroesophageal reflux disease)    Headache    migraines in her younger years   Heart murmur    since birth and never has been a problem   History of kidney stones    7 times   Hyperlipidemia    Hypertension    Neuromuscular disorder ([place])    nerve damage left leg   PONV (postoperative nausea and vomiting)    shorter surgeries tends to have n/v.    Pre-diabetes     Surgeries: Procedure(s): RIGHT-SIDED LUMBAR 4 - LUMBAR 5 TRANSFORAMINAL LUMBAR INTERBODY FUSION WITH INSTRUMENTATION AND ALLOGRAFT, REMOVAL OF SPINAL CORD STIMULATOR BATTERY. on 01/06/2022   Consultants: None  Discharged Condition: Improved  Hospital Course: Melanie Avila is an 61 y.o. female who was admitted 01/06/2022 for operative treatment of Radiculopathy. Patient has severe unremitting pain that affects sleep, daily activities, and work/hobbies. After pre-op clearance the patient was taken to the operating room on 01/06/2022 and underwent  Procedure(s): RIGHT-SIDED LUMBAR 4 - LUMBAR 5 TRANSFORAMINAL LUMBAR INTERBODY FUSION WITH INSTRUMENTATION AND ALLOGRAFT, REMOVAL OF SPINAL CORD STIMULATOR BATTERY..    Patient was given perioperative antibiotics:  Anti-infectives (From admission, onward)    Start     Dose/Rate Route Frequency Ordered Stop   01/06/22 1630  ceFAZolin (ANCEF) IVPB 2g/100 mL premix        2 g 200 mL/hr over 30 Minutes Intravenous Every 8 hours 01/06/22 1437 01/06/22 2325   01/06/22 0600  ceFAZolin (ANCEF) IVPB 2g/100 mL premix         2 g 200 mL/hr over 30 Minutes Intravenous On call to O.R. 01/06/22 0552 01/06/22 1213   01/06/22 0553  ceFAZolin (ANCEF) 2-4 GM/100ML-% IVPB       Note to Pharmacy: Melanie Avila: cabinet override      01/06/22 0553 01/06/22 7209        Patient was given sequential compression devices, early ambulation to prevent DVT.  Patient benefited maximally from hospital stay and there were no complications.    Recent vital signs: BP 134/65 (BP Location: Right Arm)    Pulse 88    Temp 98.5 F (36.9 C) (Oral)    Resp 18    Ht 5' 6"  (1.676 m)    Wt 91.2 kg    SpO2 97%    BMI 32.44 kg/m    Discharge Medications:   Allergies as of 01/07/2022       Reactions   Cymbalta [duloxetine Hcl]    Suicidal thoughts   Effexor [venlafaxine Hydrochloride] Other (See Comments)   Suicidal thoughts   Sulfa Antibiotics Swelling   Lyrica [pregabalin] Other (See Comments)   dizziness   Morphine And Related Other (See Comments)   Makes her constipated and does not help the pain        Medication List     STOP taking these medications    MAGNESIUM PO       TAKE these medications    ALPRAZolam 1 MG tablet  Commonly known as: XANAX Take 1 mg by mouth 2 (two) times daily as needed for anxiety.   atorvastatin 40 MG tablet Commonly known as: LIPITOR Take 40 mg by mouth daily.   buPROPion 150 MG 24 hr tablet Commonly known as: WELLBUTRIN XL Take 150 mg by mouth daily.   carisoprodol 350 MG tablet Commonly known as: SOMA Take 350 mg by mouth 3 (three) times daily as needed for muscle spasms.   citalopram 40 MG tablet Commonly known as: CELEXA Take 40 mg by mouth daily.   Dialyvite Vitamin D 5000 125 MCG (5000 UT) capsule Generic drug: Cholecalciferol Take 5,000 Units by mouth daily.   fenofibrate 160 MG tablet Take 160 mg by mouth daily.   gabapentin 300 MG capsule Commonly known as: NEURONTIN Take 300 mg by mouth 2 (two) times daily.   HYDROcodone-acetaminophen 10-325 MG  tablet Commonly known as: NORCO Take 1 tablet by mouth 5 (five) times daily as needed for moderate pain.   lisinopril-hydrochlorothiazide 20-12.5 MG tablet Commonly known as: ZESTORETIC Take 1 tablet by mouth daily.   methocarbamol 500 MG tablet Commonly known as: ROBAXIN Take 1 tablet (500 mg total) by mouth every 6 (six) hours as needed for muscle spasms.   naloxone 4 MG/0.1ML Liqd nasal spray kit Commonly known as: NARCAN Place 1 spray into the nose once.   oxyCODONE-acetaminophen 5-325 MG tablet Commonly known as: PERCOCET/ROXICET Take 1-2 tablets by mouth every 4 (four) hours as needed for severe pain.   TYLENOL SINUS CONGESTION/PAIN PO Take 2 tablets by mouth daily as needed (congestion / pain).        Diagnostic Studies: DG Lumbar Spine 2-3 Views  Result Date: 01/06/2022 CLINICAL DATA:  L4-L5 interbody fusion. EXAM: LUMBAR SPINE - 2-3 VIEW COMPARISON:  Lumbar spine CT 12/31/2021 FLUOROSCOPY TIME: 31 seconds (25.7 mGy) FINDINGS: Spinal labeling is in keeping with preprocedural lumbar spine CT. Two spot fluoroscopic images of the lower lumbar spine are provided for review and demonstrate apparent removal of the pre-existing L5-S1 paraspinal fusion hardware and placement of new L4-L5 paraspinal fusion and intervertebral disc spacer placement. Alignment appears anatomic. Similar sequela of previous L5-S1 intervertebral disc space replacement. Spinal stimulator leads overlie the posterior aspect of the operative site. There is a minimal amount of subcutaneous emphysema about the operative site. No radiopaque foreign body. IMPRESSION: 1. Post L4-L5 paraspinal fusion intervertebral disc spacer placement without evidence of complication. 2. Interval removal of the L5-S1 paraspinal fusion hardware. Electronically Signed   By: Sandi Mariscal M.D.   On: 01/06/2022 12:33   CT LUMBAR SPINE WO CONTRAST  Result Date: 01/03/2022 CLINICAL DATA:  Back pain and left leg pain. EXAM: CT LUMBAR SPINE  WITHOUT CONTRAST TECHNIQUE: Multidetector CT imaging of the lumbar spine was performed without intravenous contrast administration. Multiplanar CT image reconstructions were also generated. RADIATION DOSE REDUCTION: This exam was performed according to the departmental dose-optimization program which includes automated exposure control, adjustment of the mA and/or kV according to patient size and/or use of iterative reconstruction technique. COMPARISON:  None. FINDINGS: Segmentation: 5 lumbar type vertebrae. Alignment: Normal. Vertebrae: No acute fracture or focal pathologic process. Small superior endplate Schmorl's nodes at all levels. Paraspinal and other soft tissues: Calcific aortic atherosclerosis. Disc levels: The disc levels above L4 are unremarkable. L4-5: Mild spinal canal stenosis due to combination of ligamentum flavum redundancy and small disc bulge. No foraminal stenosis. Mild facet hypertrophy. L5-S1: PLIF without adverse features. Widely patent spinal canal. No neural impingement. IMPRESSION: 1. Mild  L4-5 spinal canal stenosis due to combination of ligamentum flavum redundancy and small disc bulge. 2. L5-S1 PLIF without adverse features. Aortic Atherosclerosis (ICD10-I70.0). Electronically Signed   By: Ulyses Jarred M.D.   On: 01/03/2022 01:28   DG Lumbar Spine 1 View  Result Date: 01/06/2022 CLINICAL DATA:  Intraoperative lumbar spine localization. EXAM: LUMBAR SPINE - 1 VIEW COMPARISON:  Lumbar spine CT - 01/03/2022 FINDINGS: Single spot lateral projection radiographic image of the lumbar spine is provided for review. Spinal labeling is in keeping with preprocedural lumbar spine CT Radiopaque marking instruments overlie the soft tissues posterior to the sacrum and the L2 spinous process. Stable sequela of L5-S1 paraspinal fusion, incompletely evaluated. Neurostimulator pack overlies the soft tissues posterior to the L4 and L5 vertebral bodies. IMPRESSION: Intraoperative localization as above.  Electronically Signed   By: Sandi Mariscal M.D.   On: 01/06/2022 12:27   DG C-Arm 1-60 Min-No Report  Result Date: 01/06/2022 Fluoroscopy was utilized by the requesting physician.  No radiographic interpretation.   DG C-Arm 1-60 Min-No Report  Result Date: 01/06/2022 Fluoroscopy was utilized by the requesting physician.  No radiographic interpretation.   DG C-Arm 1-60 Min-No Report  Result Date: 01/06/2022 Fluoroscopy was utilized by the requesting physician.  No radiographic interpretation.   DG C-Arm 1-60 Min-No Report  Result Date: 01/06/2022 Fluoroscopy was utilized by the requesting physician.  No radiographic interpretation.    Disposition: Discharge disposition: 01-Home or Self Care      POD #1 s/p L4/5 decompression and fusion, doing well   - up with PT/OT, encourage ambulation - Percocet for pain, Robaxin for muscle spasms -Scripts for pain sent to pharmacy electronically  -D/C instructions sheet printed and in chart -D/C today  -F/U in office 2 weeks   Signed: Lennie Muckle Maryse Brierley 01/19/2022, 1:01 PM

## 2022-03-30 ENCOUNTER — Other Ambulatory Visit (HOSPITAL_COMMUNITY): Payer: Self-pay | Admitting: Adult Health Nurse Practitioner

## 2022-03-30 ENCOUNTER — Other Ambulatory Visit: Payer: Self-pay | Admitting: Adult Health Nurse Practitioner

## 2022-03-30 DIAGNOSIS — Z1231 Encounter for screening mammogram for malignant neoplasm of breast: Secondary | ICD-10-CM

## 2022-03-30 DIAGNOSIS — R1013 Epigastric pain: Secondary | ICD-10-CM

## 2022-04-01 DIAGNOSIS — M5451 Vertebrogenic low back pain: Secondary | ICD-10-CM | POA: Diagnosis not present

## 2022-04-01 DIAGNOSIS — Z9889 Other specified postprocedural states: Secondary | ICD-10-CM | POA: Diagnosis not present

## 2022-04-04 ENCOUNTER — Inpatient Hospital Stay
Admission: RE | Admit: 2022-04-04 | Discharge: 2022-04-04 | Disposition: A | Discharge status: Home / Self Care | Payer: Self-pay | Source: Ambulatory Visit | Attending: Adult Health Nurse Practitioner | Admitting: Adult Health Nurse Practitioner

## 2022-04-04 ENCOUNTER — Other Ambulatory Visit (HOSPITAL_COMMUNITY): Payer: Self-pay | Admitting: Adult Health Nurse Practitioner

## 2022-04-04 ENCOUNTER — Ambulatory Visit (HOSPITAL_COMMUNITY)
Admission: RE | Admit: 2022-04-04 | Discharge: 2022-04-04 | Disposition: A | Discharge status: Home / Self Care | Payer: 59 | Source: Ambulatory Visit | Attending: Adult Health Nurse Practitioner | Admitting: Adult Health Nurse Practitioner

## 2022-04-04 DIAGNOSIS — Z1231 Encounter for screening mammogram for malignant neoplasm of breast: Secondary | ICD-10-CM

## 2022-04-07 ENCOUNTER — Ambulatory Visit (HOSPITAL_COMMUNITY)
Admission: RE | Admit: 2022-04-07 | Discharge: 2022-04-07 | Disposition: A | Discharge status: Home / Self Care | Payer: 59 | Source: Ambulatory Visit | Attending: Adult Health Nurse Practitioner | Admitting: Adult Health Nurse Practitioner

## 2022-04-07 DIAGNOSIS — R1013 Epigastric pain: Secondary | ICD-10-CM | POA: Insufficient documentation

## 2022-04-07 DIAGNOSIS — K760 Fatty (change of) liver, not elsewhere classified: Secondary | ICD-10-CM | POA: Diagnosis not present

## 2022-07-08 ENCOUNTER — Ambulatory Visit
Admission: EM | Admit: 2022-07-08 | Discharge: 2022-07-08 | Disposition: A | Discharge status: Home / Self Care | Payer: 59 | Attending: Nurse Practitioner | Admitting: Nurse Practitioner

## 2022-07-08 ENCOUNTER — Other Ambulatory Visit: Payer: Self-pay

## 2022-07-08 ENCOUNTER — Encounter: Payer: Self-pay | Admitting: Emergency Medicine

## 2022-07-08 DIAGNOSIS — R109 Unspecified abdominal pain: Secondary | ICD-10-CM

## 2022-07-08 DIAGNOSIS — N3000 Acute cystitis without hematuria: Secondary | ICD-10-CM | POA: Diagnosis not present

## 2022-07-08 DIAGNOSIS — R10A0 Flank pain, unspecified side: Secondary | ICD-10-CM

## 2022-07-08 LAB — POCT URINALYSIS DIP (MANUAL ENTRY)
Bilirubin, UA: NEGATIVE
Blood, UA: NEGATIVE
Glucose, UA: NEGATIVE mg/dL
Ketones, POC UA: NEGATIVE mg/dL
Leukocytes, UA: NEGATIVE
Nitrite, UA: POSITIVE — AB
Protein Ur, POC: 30 mg/dL — AB
Spec Grav, UA: 1.025 (ref 1.010–1.025)
Urobilinogen, UA: 1 E.U./dL
pH, UA: 6 (ref 5.0–8.0)

## 2022-07-08 MED ORDER — CEFPODOXIME PROXETIL 200 MG PO TABS: 200.0000 mg | ORAL_TABLET | Freq: Two times a day (BID) | ORAL | 0 refills | Status: AC

## 2022-07-08 NOTE — ED Triage Notes (Addendum)
Pt reports lower back pain for last several days. Pt reports history of similar and reports has been taking AZO x2 days. Denies any fever, urinary frequency, dysuria,abd pain.  Pt also reports bilateral ear fullness for last several weeks.

## 2022-07-08 NOTE — Discharge Instructions (Addendum)
-   Urinalysis today shows signs of infection; please start on the Vantin and take it twice daily for 7 days.  Please also increase water intake to 64 ounces daily. -We are sending the urine for a culture and will let you know if this comes back showing that we need to change antibiotic on Monday -If you develop high fevers, nausea/vomiting and are unable to keep fluids down, or severe pain in your back, unable to urinate, please go to the emergency room

## 2022-07-08 NOTE — ED Provider Notes (Signed)
[facility]    CSN: [id number] Arrival date & time: 07/08/22  1242      History   Chief Complaint Chief Complaint  Patient presents with   Back Pain    HPI [name] is a 61 y.o. female.   Patient presents with roughly 1 week of new bilateral low back pain.  She denies dysuria, urinary frequency, urgency, voiding smaller amounts, urinary incontinence, foul urine odor, hematuria, suprapubic pain, fever, and nausea/vomiting.  She endorses some abdominal pain.  Has a history of chronic back pain, however reports this pain is different.  Reports a history of pyelonephritis.  Denies vaginal discharge.  She is eating and drinking normally per her report.  Has taken Azo without much relief.  Last dose of Azo was 24 hours ago.       Past Medical History:  Diagnosis Date   Anemia    Anxiety    Complication of anesthesia    last shoulder surg 2010-[facility]-2 hr after block to surg-neck swelled-hard to tube-stayed RCC   DDD (degenerative disc disease)    Depression    Fibromyalgia    GERD (gastroesophageal reflux disease)    Headache    migraines in her younger years   Heart murmur    since birth and never has been a problem   History of kidney stones    7 times   Hyperlipidemia    Hypertension    Neuromuscular disorder ([place])    nerve damage left leg   PONV (postoperative nausea and vomiting)    shorter surgeries tends to have n/v.    Pre-diabetes     Patient Active Problem List   Diagnosis Date Noted   Radiculopathy 11/02/2016   Left leg pain 11/22/2014   Fibromyalgia 01/30/2012   Depression with anxiety 01/30/2012   Lumbar post-laminectomy syndrome 01/30/2012   Thoracic or lumbosacral neuritis or radiculitis, unspecified 01/30/2012    Past Surgical History:  Procedure Laterality Date   BACK SURGERY  89,02   lumbar x2   CARPAL TUNNEL RELEASE Bilateral    COLONOSCOPY N/A 12/09/2015   Procedure: COLONOSCOPY;  Surgeon: Rogene Houston, MD;  [facility]: [facility];  Service: Endoscopy;  Laterality: N/A;  830   SHOULDER ARTHROSCOPY WITH SUBACROMIAL DECOMPRESSION  10/23/2012   Procedure: SHOULDER ARTHROSCOPY WITH SUBACROMIAL DECOMPRESSION;  Surgeon: Cammie Sickle., MD;  Location: [facility];  Service: Orthopedics;  Laterality: Left;  LEFT SHOULDER  ARTHROSCOPY WITH SUBACROMIAL DECOMPRESSION, DISTAL CLAVICLE RESECTION, ARTHROSCOPIC SUBSCAPULARIS REPAIR AND OPEN REPAIR OF SUPRASPINATOUS TENDON   SHOULDER SURGERY Bilateral    TRANSFORAMINAL LUMBAR INTERBODY FUSION (TLIF) WITH PEDICLE SCREW FIXATION 1 LEVEL Right 01/06/2022   Procedure: RIGHT-SIDED LUMBAR 4 - LUMBAR 5 TRANSFORAMINAL LUMBAR INTERBODY FUSION WITH INSTRUMENTATION AND ALLOGRAFT, REMOVAL OF SPINAL CORD STIMULATOR BATTERY.;  Surgeon: Phylliss Bob, MD;  Location: [facility];  Service: Orthopedics;  Laterality: Right;   TUBAL LIGATION      OB History   No obstetric history on file.      Home Medications    Prior to Admission medications   Medication Sig Start Date End Date Taking? Authorizing Provider  cefpodoxime (VANTIN) 200 MG tablet Take 1 tablet (200 mg total) by mouth 2 (two) times daily for 7 days. 07/08/22 07/15/22 Yes Eulogio Bear, NP  ALPRAZolam Duanne Moron) 1 MG tablet Take 1 mg by mouth 2 (two) times daily as needed for anxiety.     [provider]  atorvastatin (LIPITOR) 40 MG  tablet Take 40 mg by mouth daily.    [provider]  buPROPion (WELLBUTRIN XL) 150 MG 24 hr tablet Take 150 mg by mouth daily.    [provider]  carisoprodol (SOMA) 350 MG tablet Take 350 mg by mouth 3 (three) times daily as needed for muscle spasms.    [provider]  Cholecalciferol (DIALYVITE VITAMIN D 5000) 125 MCG (5000 UT) capsule Take 5,000 Units by mouth daily.    [provider]  citalopram (CELEXA) 40 MG tablet Take 40 mg by mouth daily.    [provider]  fenofibrate 160 MG tablet Take 160 mg by mouth daily.     [provider]  gabapentin (NEURONTIN) 300 MG capsule Take 300 mg by mouth 2 (two) times daily.    [provider]  HYDROcodone-acetaminophen (NORCO) 10-325 MG tablet Take 1 tablet by mouth 5 (five) times daily as needed for moderate pain.    [provider]  lisinopril-hydrochlorothiazide (ZESTORETIC) 20-12.5 MG tablet Take 1 tablet by mouth daily.    [provider]  methocarbamol (ROBAXIN) 500 MG tablet Take 1 tablet (500 mg total) by mouth every 6 (six) hours as needed for muscle spasms. 01/07/22   Estill Bamberg, MD  naloxone [facility]) nasal spray 4 mg/0.1 mL Place 1 spray into the nose once.    [provider]  oxyCODONE-acetaminophen (PERCOCET/ROXICET) 5-325 MG tablet Take 1-2 tablets by mouth every 4 (four) hours as needed for severe pain. 01/07/22   Estill Bamberg, MD  Phenylephrine-Acetaminophen (TYLENOL SINUS CONGESTION/PAIN PO) Take 2 tablets by mouth daily as needed (congestion / pain).    [provider]    Family History Family History  Problem Relation Age of Onset   Alzheimer's disease Mother    Prostate cancer Father    Leukemia Father    Heart attack Maternal Aunt    Heart attack Maternal Uncle    Diabetes Cousin    Diabetes Cousin     Social History Social History   Tobacco Use   Smoking status: Former    Packs/day: 1.00    Years: 35.00    Total pack years: 35.00    Types: Cigarettes    Quit date: 08/31/2021    Years since quitting: 0.8   Smokeless tobacco: Never   Tobacco comments:    down to 4 cigarettes (as of 10/31/16)  Vaping Use   Vaping Use: Former  Substance Use Topics   Alcohol use: No   Drug use: No     Allergies   Cymbalta [duloxetine hcl], Effexor [venlafaxine hydrochloride], Sulfa antibiotics, Lyrica [pregabalin], and Morphine and related   Review of Systems Review of Systems Per HPI  Physical Exam Triage Vital Signs ED Triage Vitals [07/08/22 1309]  Enc Vitals Group     BP       Pulse Rate 75     Resp 20     Temp 98.2 F (36.8 C)     Temp Source Oral     SpO2 94 %     Weight      Height      Head Circumference      Peak Flow      Pain Score 3     Pain Loc      Pain Edu?      Excl. in GC?    No data found.  Updated Vital Signs Pulse 75   Temp 98.2 F (36.8 C) (Oral)   Resp 20   SpO2 94%  Visual Acuity Right Eye Distance:   Left Eye Distance:   Bilateral Distance:    Right Eye Near:   Left Eye Near:    Bilateral Near:     Physical Exam Vitals and nursing note reviewed.  Constitutional:      General: She is not in acute distress.    Appearance: She is not toxic-appearing.  Pulmonary:     Effort: Pulmonary effort is normal. No respiratory distress.  Abdominal:     General: Abdomen is flat. Bowel sounds are normal. There is no distension.     Palpations: Abdomen is soft. There is no mass.     Tenderness: There is no abdominal tenderness. There is right CVA tenderness and left CVA tenderness. There is no guarding.  Skin:    General: Skin is warm and dry.     Coloration: Skin is not jaundiced or pale.     Findings: No erythema.  Neurological:     Mental Status: She is alert and oriented to person, place, and time.  Psychiatric:        Behavior: Behavior is cooperative.      UC Treatments / Results  Labs (all labs ordered are listed, but only abnormal results are displayed) Labs Reviewed  POCT URINALYSIS DIP (MANUAL ENTRY) - Abnormal; Notable for the following components:      Result Value   Clarity, UA hazy (*)    Protein Ur, POC =30 (*)    Nitrite, UA Positive (*)    All other components within normal limits  URINE CULTURE    EKG   Radiology No results found.  Procedures Procedures (including critical care time)  Medications Ordered in UC Medications - No data to display  Initial Impression / Assessment and Plan / UC Course  I have reviewed the triage vital signs and the nursing notes.  Pertinent labs & imaging  results that were available during my care of the patient were reviewed by me and considered in my medical decision making (see chart for details).    Patient is a very pleasant, well-appearing 61 year old female presenting for acute flank pain bilaterally today.  Urinalysis positive for nitrites, protein, hazy and clarity.  Discussed findings with patient.  Low suspicion for pyelonephritis given stable vital signs, patient appearing well, however will send urine for culture and while pending treat with Vantin 200 mg twice daily for 7 days.  ER precautions discussed.  Follow-up with PCP early next week with no improvement. Final Clinical Impressions(s) / UC Diagnoses   Final diagnoses:  Flank pain  Acute cystitis without hematuria     Discharge Instructions      - Urinalysis today shows signs of infection; please start on the Vantin and take it twice daily for 7 days.  Please also increase water intake to 64 ounces daily. -We are sending the urine for a culture and will let you know if this comes back showing that we need to change antibiotic on Monday -If you develop high fevers, nausea/vomiting and are unable to keep fluids down, or severe pain in your back, unable to urinate, please go to the emergency room    ED Prescriptions     Medication Sig Dispense Auth. Provider   cefpodoxime (VANTIN) 200 MG tablet Take 1 tablet (200 mg total) by mouth 2 (two) times daily for 7 days. 14 tablet Valentino Nose, NP      PDMP not reviewed this encounter.   Valentino Nose, NP 07/08/22 1414

## 2022-07-10 LAB — URINE CULTURE: Culture: NO GROWTH

## 2022-08-19 DIAGNOSIS — I10 Essential (primary) hypertension: Secondary | ICD-10-CM | POA: Diagnosis not present

## 2022-08-19 DIAGNOSIS — R7303 Prediabetes: Secondary | ICD-10-CM | POA: Diagnosis not present

## 2022-08-19 DIAGNOSIS — E782 Mixed hyperlipidemia: Secondary | ICD-10-CM | POA: Diagnosis not present

## 2022-10-03 DIAGNOSIS — Z6832 Body mass index (BMI) 32.0-32.9, adult: Secondary | ICD-10-CM | POA: Diagnosis not present

## 2022-10-03 DIAGNOSIS — M797 Fibromyalgia: Secondary | ICD-10-CM | POA: Diagnosis not present

## 2022-10-03 DIAGNOSIS — K5900 Constipation, unspecified: Secondary | ICD-10-CM | POA: Diagnosis not present

## 2022-10-03 DIAGNOSIS — E669 Obesity, unspecified: Secondary | ICD-10-CM | POA: Diagnosis not present

## 2022-10-03 DIAGNOSIS — E785 Hyperlipidemia, unspecified: Secondary | ICD-10-CM | POA: Diagnosis not present

## 2022-10-03 DIAGNOSIS — R69 Illness, unspecified: Secondary | ICD-10-CM | POA: Diagnosis not present

## 2022-10-03 DIAGNOSIS — M4800 Spinal stenosis, site unspecified: Secondary | ICD-10-CM | POA: Diagnosis not present

## 2022-10-03 DIAGNOSIS — M792 Neuralgia and neuritis, unspecified: Secondary | ICD-10-CM | POA: Diagnosis not present

## 2022-10-03 DIAGNOSIS — K219 Gastro-esophageal reflux disease without esophagitis: Secondary | ICD-10-CM | POA: Diagnosis not present

## 2022-10-03 DIAGNOSIS — M1990 Unspecified osteoarthritis, unspecified site: Secondary | ICD-10-CM | POA: Diagnosis not present

## 2022-10-03 DIAGNOSIS — I10 Essential (primary) hypertension: Secondary | ICD-10-CM | POA: Diagnosis not present

## 2023-05-23 DIAGNOSIS — R32 Unspecified urinary incontinence: Secondary | ICD-10-CM | POA: Diagnosis not present

## 2023-05-23 DIAGNOSIS — Z87891 Personal history of nicotine dependence: Secondary | ICD-10-CM | POA: Diagnosis not present

## 2023-05-23 DIAGNOSIS — J449 Chronic obstructive pulmonary disease, unspecified: Secondary | ICD-10-CM | POA: Diagnosis not present

## 2023-05-23 DIAGNOSIS — H547 Unspecified visual loss: Secondary | ICD-10-CM | POA: Diagnosis not present

## 2023-05-23 DIAGNOSIS — M1990 Unspecified osteoarthritis, unspecified site: Secondary | ICD-10-CM | POA: Diagnosis not present

## 2023-05-23 DIAGNOSIS — I10 Essential (primary) hypertension: Secondary | ICD-10-CM | POA: Diagnosis not present

## 2023-05-23 DIAGNOSIS — Z882 Allergy status to sulfonamides status: Secondary | ICD-10-CM | POA: Diagnosis not present

## 2023-05-23 DIAGNOSIS — E669 Obesity, unspecified: Secondary | ICD-10-CM | POA: Diagnosis not present

## 2023-05-23 DIAGNOSIS — Z008 Encounter for other general examination: Secondary | ICD-10-CM | POA: Diagnosis not present

## 2023-05-23 DIAGNOSIS — K219 Gastro-esophageal reflux disease without esophagitis: Secondary | ICD-10-CM | POA: Diagnosis not present

## 2023-05-23 DIAGNOSIS — E785 Hyperlipidemia, unspecified: Secondary | ICD-10-CM | POA: Diagnosis not present

## 2023-06-02 ENCOUNTER — Encounter (HOSPITAL_COMMUNITY): Payer: Self-pay | Admitting: Emergency Medicine

## 2023-06-02 ENCOUNTER — Emergency Department (HOSPITAL_COMMUNITY)
Admission: EM | Admit: 2023-06-02 | Discharge: 2023-06-02 | Disposition: A | Discharge status: Home / Self Care | Payer: 59 | Attending: Emergency Medicine | Admitting: Emergency Medicine

## 2023-06-02 ENCOUNTER — Other Ambulatory Visit: Payer: Self-pay

## 2023-06-02 ENCOUNTER — Emergency Department (HOSPITAL_COMMUNITY): Payer: 59

## 2023-06-02 DIAGNOSIS — M5432 Sciatica, left side: Secondary | ICD-10-CM

## 2023-06-02 DIAGNOSIS — M25552 Pain in left hip: Secondary | ICD-10-CM | POA: Diagnosis not present

## 2023-06-02 DIAGNOSIS — M5442 Lumbago with sciatica, left side: Secondary | ICD-10-CM | POA: Insufficient documentation

## 2023-06-02 DIAGNOSIS — I700 Atherosclerosis of aorta: Secondary | ICD-10-CM | POA: Diagnosis not present

## 2023-06-02 DIAGNOSIS — M5116 Intervertebral disc disorders with radiculopathy, lumbar region: Secondary | ICD-10-CM | POA: Diagnosis not present

## 2023-06-02 DIAGNOSIS — M545 Low back pain, unspecified: Secondary | ICD-10-CM | POA: Diagnosis not present

## 2023-06-02 MED ORDER — PREDNISONE 10 MG PO TABS
60.0000 mg | ORAL_TABLET | Freq: Once | ORAL | Status: AC
  Administered 2023-06-02: 60 mg via ORAL
  Filled 2023-06-02: qty 1

## 2023-06-02 MED ORDER — TIZANIDINE HCL 4 MG PO TABS: 4.0000 mg | ORAL_TABLET | Freq: Three times a day (TID) | ORAL | 0 refills | Status: DC | PRN

## 2023-06-02 MED ORDER — PREDNISONE 10 MG PO TABS: ORAL_TABLET | ORAL | 0 refills | Status: DC

## 2023-06-02 NOTE — Discharge Instructions (Signed)
Take your next dose of prednisone tomorrow morning.  I have prescribed you tizanidine to take in place of the Robaxin and your Soma since this medicine seem to have worked better for you.  Plan a recheck by your primary provider if your symptoms are not improving with this treatment plan.  Do not take any other anti-inflammatory medications while on the prednisone.  Your x-rays are negative for acute findings, specifically no fractures or significant arthritis.

## 2023-06-02 NOTE — ED Triage Notes (Signed)
Pt ambulatory to triage with c/o lower left back pain that radiates into left hip and left thigh to knee.  Pt states pain is worse with movement, especially standing and lifting the leg.  Pt denies urinary symptoms.  Pt has history of chronic back problems.

## 2023-06-04 NOTE — ED Provider Notes (Signed)
[place] EMERGENCY DEPARTMENT AT [facility] Provider Note   CSN: [id number] Arrival date & time: 06/02/23  0957     History  Chief Complaint  Patient presents with   Back Pain    Melanie Avila is a 62 y.o. female  presents with acute on chronic low back pain which has which has escalated since yesterday.   Patient denies any new injury specifically.  There is radiation of pain into the left lower extremity to her left lateral hi and thigh.  There has been no weakness or numbness in the lower extremities and no urinary or bowel retention or incontinence.  Patient does not have a history of cancer or IVDU.  The patient has tried nsaids and robaxin without significant relief of symptoms.   The history is provided by the patient.       Home Medications Prior to Admission medications   Medication Sig Start Date End Date Taking? Authorizing Provider  ALPRAZolam Prudy Feeler) 1 MG tablet Take 1 mg by mouth 2 (two) times daily as needed for anxiety.     [provider]  atorvastatin (LIPITOR) 40 MG tablet Take 40 mg by mouth daily.    [provider]  buPROPion (WELLBUTRIN XL) 150 MG 24 hr tablet Take 150 mg by mouth daily.    [provider]  Cholecalciferol (DIALYVITE VITAMIN D 5000) 125 MCG (5000 UT) capsule Take 5,000 Units by mouth daily.    [provider]  citalopram (CELEXA) 40 MG tablet Take 40 mg by mouth daily.    [provider]  fenofibrate 160 MG tablet Take 160 mg by mouth daily.    [provider]  gabapentin (NEURONTIN) 300 MG capsule Take 300 mg by mouth 2 (two) times daily.    [provider]  HYDROcodone-acetaminophen (NORCO) 10-325 MG tablet Take 1 tablet by mouth 5 (five) times daily as needed for moderate pain.    [provider]  lisinopril-hydrochlorothiazide (ZESTORETIC) 20-12.5 MG tablet Take 1 tablet by mouth daily.    [provider]  naloxone [facility]) nasal spray 4 mg/0.1  mL Place 1 spray into the nose once.    [provider]  oxyCODONE-acetaminophen (PERCOCET/ROXICET) 5-325 MG tablet Take 1-2 tablets by mouth every 4 (four) hours as needed for severe pain. 01/07/22   Estill Bamberg, MD  Phenylephrine-Acetaminophen (TYLENOL SINUS CONGESTION/PAIN PO) Take 2 tablets by mouth daily as needed (congestion / pain).    [provider]  predniSONE (DELTASONE) 10 MG tablet 6, 5, 4, 3, 2 then 1 tablet by mouth daily for 6 days total. 06/02/23   Larrisa Cravey, Raynelle Fanning, PA-C  tiZANidine (ZANAFLEX) 4 MG tablet Take 1 tablet (4 mg total) by mouth every 8 (eight) hours as needed for muscle spasms. 06/02/23   Burgess Amor, PA-C      Allergies    Cymbalta [duloxetine hcl], Effexor [venlafaxine hydrochloride], Sulfa antibiotics, Lyrica [pregabalin], and Morphine and codeine    Review of Systems   Review of Systems  Constitutional:  Negative for fever.  Respiratory:  Negative for shortness of breath.   Cardiovascular:  Negative for chest pain and leg swelling.  Gastrointestinal:  Negative for abdominal distention, abdominal pain and constipation.  Genitourinary:  Negative for difficulty urinating, dysuria, flank pain, frequency and urgency.  Musculoskeletal:  Positive for back pain. Negative for gait problem and joint swelling.  Skin:  Negative for rash.  Neurological:  Negative for weakness and numbness.    Physical Exam Updated Vital Signs  BP (!) 159/86   Pulse 60   Temp 97.9 F (36.6 C) (Oral)   Resp 18   Ht 5\' 5"  (1.651 m)   Wt 86.2 kg   SpO2 99%   BMI 31.62 kg/m  Physical Exam Vitals and nursing note reviewed.  Constitutional:      Appearance: She is well-developed.  HENT:     Head: Normocephalic.  Eyes:     Conjunctiva/sclera: Conjunctivae normal.  Cardiovascular:     Rate and Rhythm: Normal rate.     Pulses: Normal pulses.     Comments: Pedal pulses normal. Pulmonary:     Effort: Pulmonary effort is normal.  Abdominal:     General: Bowel sounds  are normal. There is no distension.     Palpations: Abdomen is soft. There is no mass.  Musculoskeletal:        General: Normal range of motion.     Cervical back: Normal range of motion and neck supple.     Lumbar back: Tenderness present. No swelling, edema or spasms.  Skin:    General: Skin is warm and dry.  Neurological:     Mental Status: She is alert.     Sensory: No sensory deficit.     Motor: No tremor or atrophy.     Gait: Gait normal.     Comments: No strength deficit noted in hip and knee flexor and extensor muscle groups.  Ankle flexion and extension intact. Ambulatory without deficit.     ED Results / Procedures / Treatments   Labs (all labs ordered are listed, but only abnormal results are displayed) Labs Reviewed - No data to display  EKG None  Radiology DG Hip Unilat W or Wo Pelvis 2-3 Views Left  Result Date: 06/02/2023 CLINICAL DATA:  Left hip pain EXAM: DG HIP (WITH OR WITHOUT PELVIS) 2-3V LEFT COMPARISON:  08/30/2016 FINDINGS: There is no evidence of hip fracture or dislocation. There is no evidence of significant arthropathy or other focal bone abnormality. Atherosclerotic vascular calcifications. IMPRESSION: Negative. Electronically Signed   By: Duanne Guess D.O.   On: 06/02/2023 13:12   DG Lumbar Spine Complete  Result Date: 06/02/2023 CLINICAL DATA:  Radicular pain EXAM: LUMBAR SPINE - COMPLETE 4+ VIEW COMPARISON:  12/31/2021, 01/06/2022 FINDINGS: Five lumbar type vertebral segments. Posterior and interbody fusion at L4-L5. Previous interbody fusion at L5-S1. Chronic superior endplate Schmorl's nodes of multiple levels. No evidence of fracture. No static listhesis. Bones are demineralized. Relatively mild disc space narrowing of multiple levels. Aortic atherosclerosis. IMPRESSION: Postsurgical changes at L4-L5 and L5-S1. No acute findings. Electronically Signed   By: Duanne Guess D.O.   On: 06/02/2023 13:11    Procedures Procedures    Medications  Ordered in ED Medications  predniSONE (DELTASONE) tablet 60 mg (60 mg Oral Given 06/02/23 1417)    ED Course/ Medical Decision Making/ A&P                             Medical Decision Making Patient presenting with acute on chronic low back pain with sciatic distribution.  There is no exam findings or history to suggest infection such as discitis or epidural abscess.  She also has no symptoms or exam findings suggesting cauda equina.  She is given a prednisone taper which she states has been helpful in the past, we will replace her Robaxin with tizanidine.  Patient states that she borrowed a tizanidine tablet yesterday which she states  worked far better for her back pain symptoms then her Robaxin, she was advised to hold her Robaxin while she is on this new muscle relaxer.  Return precautions were outlined, plan follow-up with her PCP for any persistent or worsening symptoms.  Amount and/or Complexity of Data Reviewed Radiology: ordered.  Risk Prescription drug management.           Final Clinical Impression(s) / ED Diagnoses Final diagnoses:  Sciatica of left side    Rx / DC Orders ED Discharge Orders          Ordered    predniSONE (DELTASONE) 10 MG tablet  Status:  Discontinued        06/02/23 1407    tiZANidine (ZANAFLEX) 4 MG tablet  Every 8 hours PRN,   Status:  Discontinued        06/02/23 1407    predniSONE (DELTASONE) 10 MG tablet        06/02/23 1432    tiZANidine (ZANAFLEX) 4 MG tablet  Every 8 hours PRN        06/02/23 1432              Burgess Amor, PA-C 06/04/23 1610    Bethann Berkshire, MD 06/05/23 1141

## 2023-06-14 ENCOUNTER — Other Ambulatory Visit (HOSPITAL_COMMUNITY): Payer: Self-pay | Admitting: Adult Health Nurse Practitioner

## 2023-06-14 DIAGNOSIS — M5416 Radiculopathy, lumbar region: Secondary | ICD-10-CM

## 2023-06-14 DIAGNOSIS — M961 Postlaminectomy syndrome, not elsewhere classified: Secondary | ICD-10-CM

## 2023-07-05 ENCOUNTER — Ambulatory Visit (HOSPITAL_COMMUNITY): Payer: 59

## 2023-07-05 ENCOUNTER — Encounter (HOSPITAL_COMMUNITY): Payer: Self-pay

## 2024-03-20 ENCOUNTER — Ambulatory Visit (INDEPENDENT_AMBULATORY_CARE_PROVIDER_SITE_OTHER): Admitting: Gastroenterology

## 2024-03-20 ENCOUNTER — Encounter: Payer: Self-pay | Admitting: Gastroenterology

## 2024-03-20 VITALS — BP 153/86 | HR 71 | Temp 98.4°F | Ht 66.0 in | Wt 213.7 lb

## 2024-03-20 DIAGNOSIS — R140 Abdominal distension (gaseous): Secondary | ICD-10-CM

## 2024-03-20 DIAGNOSIS — K760 Fatty (change of) liver, not elsewhere classified: Secondary | ICD-10-CM | POA: Diagnosis not present

## 2024-03-20 DIAGNOSIS — R1011 Right upper quadrant pain: Secondary | ICD-10-CM | POA: Insufficient documentation

## 2024-03-20 DIAGNOSIS — K5900 Constipation, unspecified: Secondary | ICD-10-CM

## 2024-03-20 NOTE — Addendum Note (Signed)
 Addended by: Delman Ferns on: 03/20/2024 11:25 AM   Modules accepted: Orders

## 2024-03-20 NOTE — Progress Notes (Addendum)
 Gastroenterology Office Note    Referring Provider: Roe Rutherford, NP Primary Care Physician:  Roe Rutherford, NP  Primary GI: Dr. Tasia Catchings   Chief Complaint   Chief Complaint  Patient presents with   Abdominal Pain    Patient here today for a follow up on her RUQ pain. Pain on going for some time now. Patient says she has issues with bloating and gas.     History of Present Illness   Melanie Avila is a 63 y.o. female presenting today at the request of Roe Rutherford, NP due to RUQ abdominal pain and history of hepatic steatosis on [place] in 2023.   She notes RUQ abdominal pain for at least a year. RUQ pain worsened postprandially. Gas, bloating. Now worsening in intensity. Eating smaller amounts. Happens with all foods and worsened with increased amounts. Gallbladder present. No weight loss, has had weight gain. No changes in bowel habits. Sometimes sluggish with BMs. Will sometimes miss a few days and then go a few times a day.   Takes diclofenac. Hx of GERD but not significant. Does not take anything for this as it's rare. No chronic PPI. No dysphagia. No overt GI bleeding.   Upcoming [place] next Friday, ordered by PCP.    03/15/24 outside labs: Tbili 0.4, Alk Phos 56, AST 26, ALT 34,. A1c 6.5. will be starting Mounjaro. Hgb 13.3. Tchol 202, TG 204, HDL 46, LDL 120.    2023 [place]: steatosis, mild CBD dilation. No gallstones. Chronic narcotics for chronic pain.    TCS in 2017 documented but does not remember. Normal.  No prior EGD.   No FH colon cancer or colon polyps.  No FH liver disease  Past Medical History:  Diagnosis Date   Anemia    Anxiety    Complication of anesthesia    last shoulder surg 2010-[facility]-2 hr after block to surg-neck swelled-hard to tube-stayed RCC   DDD (degenerative disc disease)    Depression    Fibromyalgia    GERD (gastroesophageal reflux disease)    Headache    migraines in her younger years   Heart murmur    since birth and never has  been a problem   History of kidney stones    7 times   Hyperlipidemia    Hypertension    Neuromuscular disorder (HCC)    nerve damage left leg   PONV (postoperative nausea and vomiting)    shorter surgeries tends to have n/v.    Pre-diabetes     Past Surgical History:  Procedure Laterality Date   BACK SURGERY  89,02   lumbar x2   CARPAL TUNNEL RELEASE Bilateral    COLONOSCOPY N/A 12/09/2015   Procedure: COLONOSCOPY;  Surgeon: Malissa Hippo, MD;  Location: [facility];  Service: Endoscopy;  Laterality: N/A;  830   SHOULDER ARTHROSCOPY WITH SUBACROMIAL DECOMPRESSION  10/23/2012   Procedure: SHOULDER ARTHROSCOPY WITH SUBACROMIAL DECOMPRESSION;  Surgeon: Wyn Forster., MD;  Location: [facility];  Service: Orthopedics;  Laterality: Left;  LEFT SHOULDER  ARTHROSCOPY WITH SUBACROMIAL DECOMPRESSION, DISTAL CLAVICLE RESECTION, ARTHROSCOPIC SUBSCAPULARIS REPAIR AND OPEN REPAIR OF SUPRASPINATOUS TENDON   SHOULDER SURGERY Bilateral    TRANSFORAMINAL LUMBAR INTERBODY FUSION (TLIF) WITH PEDICLE SCREW FIXATION 1 LEVEL Right 01/06/2022   Procedure: RIGHT-SIDED LUMBAR 4 - LUMBAR 5 TRANSFORAMINAL LUMBAR INTERBODY FUSION WITH INSTRUMENTATION AND ALLOGRAFT, REMOVAL OF SPINAL CORD STIMULATOR BATTERY.;  Surgeon: Estill Bamberg, MD;  Location: [facility];  Service: Orthopedics;  Laterality: Right;   TUBAL LIGATION      Current Outpatient Medications  Medication Sig Dispense Refill   ALPRAZolam (XANAX) 1 MG tablet Take 1 mg by mouth 2 (two) times daily as needed for anxiety.      ARIPiprazole (ABILIFY) 2 MG tablet Take 2 mg by mouth daily.     atorvastatin (LIPITOR) 40 MG tablet Take 40 mg by mouth daily.     carisoprodol (SOMA) 350 MG tablet Take 350 mg by mouth 3 (three) times daily.     Cholecalciferol (DIALYVITE VITAMIN D 5000) 125 MCG (5000 UT) capsule Take 5,000 Units by mouth daily.     citalopram (CELEXA) 40 MG tablet Take 40 mg by mouth daily.     diclofenac (VOLTAREN) 75 MG EC  tablet Take 75 mg by mouth 2 (two) times daily.     fenofibrate 160 MG tablet Take 160 mg by mouth daily.     lisinopril-hydrochlorothiazide (ZESTORETIC) 20-12.5 MG tablet Take 1 tablet by mouth daily.     naloxone (NARCAN) nasal spray 4 mg/0.1 mL Place 1 spray into the nose once.     oxyCODONE-acetaminophen (PERCOCET/ROXICET) 5-325 MG tablet Take 1-2 tablets by mouth every 4 (four) hours as needed for severe pain. 30 tablet 0   Phenylephrine-Acetaminophen (TYLENOL SINUS CONGESTION/PAIN PO) Take 2 tablets by mouth daily as needed (congestion / pain).     tirzepatide [facility]) 2.5 MG/0.5ML Pen Inject 2.5 mg into the skin once a week. (Patient not taking: Reported on 03/20/2024)     No current facility-administered medications for this visit.    Allergies as of 03/20/2024 - Review Complete 03/20/2024  Allergen Reaction Noted   Cymbalta [duloxetine hcl]  01/05/2012   Effexor [venlafaxine hydrochloride] Other (See Comments) 01/05/2012   Sulfa antibiotics Swelling 09/29/2011   Lyrica [pregabalin] Other (See Comments) 02/27/2015   Morphine and codeine Other (See Comments) 10/31/2016    Family History  Problem Relation Age of Onset   Alzheimer's disease Mother    Prostate cancer Father    Leukemia Father    Heart attack Maternal Aunt    Heart attack Maternal Uncle    Diabetes Cousin    Diabetes Cousin     Social History   Socioeconomic History   Marital status: Married    Spouse name: Tommy   Number of children: 2   Years of education: 12   Highest education level: Not on file  Occupational History   Not on file  Tobacco Use   Smoking status: Former    Current packs/day: 0.00    Average packs/day: 1 pack/day for 35.0 years (35.0 ttl pk-yrs)    Types: Cigarettes    Start date: 08/31/1986    Quit date: 08/31/2021    Years since quitting: 2.5   Smokeless tobacco: Never   Tobacco comments:    down to 4 cigarettes (as of 10/31/16)  Vaping Use   Vaping status: Former  Substance  and Sexual Activity   Alcohol use: No   Drug use: No   Sexual activity: Not Currently  Other Topics Concern   Not on file  Social History Narrative   Patient lives at home with husband and son   Patient has 2 children    Patient has a high school education    Patient works has [employer]   Patient is right handed    Caffeine use: 1/2-1 cup coffee occasionally    Drinks tea occas   Social Drivers of Corporate investment banker Strain:  Low Risk  (03/15/2024)   Received from [facility]   Overall Financial Resource Strain (CARDIA)    Difficulty of Paying Living Expenses: Not very hard  Food Insecurity: No Food Insecurity (03/15/2024)   Received from [facility]   Hunger Vital Sign    Worried About Running Out of Food in the Last Year: Never true    Ran Out of Food in the Last Year: Never true  Transportation Needs: No Transportation Needs (03/15/2024)   Received from [facility] - Transportation    Lack of Transportation (Medical): No    Lack of Transportation (Non-Medical): No  Physical Activity: Unknown (12/29/2022)   Received from [facility], [facility]   Exercise Vital Sign    Days of Exercise per Week: 0 days    Minutes of Exercise per Session: Not on file  Stress: Stress Concern Present (12/29/2022)   Received from [facility], [place] [facility] - Occupational Stress Questionnaire    Feeling of Stress : Rather much  Social Connections: Socially Isolated (12/29/2022)   Received from [facility], [facility]   Social Network    How would you rate your social network (family, work, friends)?: Little participation, lonely and socially isolated  Intimate Partner Violence: Not At Risk (12/29/2022)   [facility]   HITS    Over the last 12 months how often did your partner physically hurt you?: Never    Over the last 12 months how often did your partner insult you or talk down to you?:  Rarely    Over the last 12 months how often did your partner threaten you with physical harm?: Never    Over the last 12 months how often did your partner scream or curse at you?: Never     Review of Systems   Gen: Denies any fever, chills, fatigue, weight loss, lack of appetite.  CV: Denies chest pain, heart palpitations, peripheral edema, syncope.  Resp: Denies shortness of breath at rest or with exertion. Denies wheezing or cough.  GI: Denies dysphagia or odynophagia. Denies jaundice, hematemesis, fecal incontinence. GU : Denies urinary burning, urinary frequency, urinary hesitancy MS: Denies joint pain, muscle weakness, cramps, or limitation of movement.  Derm: Denies rash, itching, dry skin Psych: Denies depression, anxiety, memory loss, and confusion Heme: Denies bruising, bleeding, and enlarged lymph nodes.   Physical Exam   BP (!) 153/86 (BP Location: Left Arm, Patient Position: Sitting, Cuff Size: Large)   Pulse 71   Temp 98.4 F (36.9 C) (Temporal)   Ht 5\' 6"  (1.676 m)   Wt 213 lb 11.2 oz (96.9 kg)   BMI 34.49 kg/m  General:   Alert and oriented. Pleasant and cooperative. Well-nourished and well-developed.  Head:  Normocephalic and atraumatic. Eyes:  Without icterus Ears:  Normal auditory acuity. Lungs:  Clear to auscultation bilaterally.  Heart:  S1, S2 present without murmurs appreciated.  Abdomen:  +BS, soft, TTP RUQ with palpation and non-distended. No HSM noted. No guarding or rebound. No masses appreciated.  Rectal:  Deferred  Msk:  Symmetrical without gross deformities. Normal posture. Extremities:  Without edema. Neurologic:  Alert and  oriented x4;  grossly normal neurologically. Skin:  Intact without significant lesions or rashes. Psych:  Alert and cooperative. Normal mood and affect.   Assessment   Melanie Avila is a 63 y.o. female presenting today at the request of Roe Rutherford, NP due to RUQ abdominal  pain and history of hepatic steatosis on US   in 2023.   RUQ abdominal pain: ongoing for approximately a year and worsening in intensity with associated bloating/gas. Symptoms classic for biliary etiology; although she had no stones on US  in 2023, I suspect biliary dyskinesia. Upcoming US  next week, which we will review. Labs reviewed and have remained normal except for mild elevated ALT at 34. CBD mildly dilated in 2023 and may ultimately need MRCP if increasing dilatation. Can consider EGD if US  is normal, although does not have classic symptoms for GI process. Celiac serologies ordered as well.   Hepatic steatosis: long-standing, spleen normal previously, no thrombocytopenia. Multiple risk factors for MASLD/MASH. Updated US  pending. Recent LFTs on file. Will check ELF score. Discussed aggressive management of glycemic control, lipid control, diet/exercise, behavior modification. Can decide further work-up/surveillance depending on ELF score. Celiac serologies ordered as well.   Screening colonoscopy: last in 2017 normal by Dr. Homero Luster. Interestingly, she does not remember this. Reviewed in Epic. No concerning lower GI signs/symptoms. 2027 unless clinical changes.   Mild constipation: does not seem to correlate with RUQ abdominal pain. Start Benefiber and add Miralax daily prn. No alarm signs/symptoms.      PLAN   ELF score, celiac serologies. After discussion with Dr. Alita Irwin, will also add fibrosure labs  US  abdomen pending for next week  Discussed MASLD/MASH management: references included. Continue glycemic management, lipid management, weight loss, dietary modifications. She will be starting on Mounjaro soon, which will be helpful.  Further recommendations to follow after review of US . If increased CBD dilation, needs MRCP  3 month follow-up regardless for MASLD  Colonoscopy 2027 unless clinical changes  Start Benefiber daily, Miralax prn  Delman Ferns, PhD, [facility]astroenter[facility]

## 2024-03-20 NOTE — Patient Instructions (Addendum)
 Start taking Benefiber 2 teaspoons each morning.   You can take one capful of Miralax each day if no bowel movement.  Please have blood work done at [facility]. I will message you on MyChart!  I suspect you will need your gallbladder removed. We can focus on fatty liver management as well, and I have ordered the lab that can help us  guide that therapy!  We will see you in 3 months for fatty liver management.   It was a pleasure to see you today. I want to create trusting relationships with patients and provide genuine, compassionate, and quality care. I truly value your feedback, so please be on the lookout for a survey regarding your visit with me today. I appreciate your time in completing this!         Delman Ferns, PhD, ANP-BC [facility] Gastroenterology

## 2024-06-04 ENCOUNTER — Ambulatory Visit: Admitting: Gastroenterology

## 2024-06-05 ENCOUNTER — Encounter: Payer: Self-pay | Admitting: Gastroenterology

## 2024-09-16 ENCOUNTER — Other Ambulatory Visit (HOSPITAL_COMMUNITY): Payer: Self-pay | Admitting: Orthopedic Surgery

## 2024-09-16 DIAGNOSIS — M5416 Radiculopathy, lumbar region: Secondary | ICD-10-CM

## 2024-09-20 ENCOUNTER — Ambulatory Visit (HOSPITAL_COMMUNITY)
Admission: RE | Admit: 2024-09-20 | Discharge: 2024-09-20 | Disposition: A | Source: Ambulatory Visit | Attending: Orthopedic Surgery | Admitting: Orthopedic Surgery

## 2024-09-20 DIAGNOSIS — M5416 Radiculopathy, lumbar region: Secondary | ICD-10-CM | POA: Diagnosis present

## 2024-10-02 ENCOUNTER — Other Ambulatory Visit: Payer: Self-pay | Admitting: Orthopedic Surgery

## 2024-10-16 NOTE — Progress Notes (Addendum)
 Surgical Instructions   Your procedure is scheduled on October 23, 2024. Report to [facility] Main Entrance A at 6:30 A.M., then check in with the Admitting office. Any questions or running late day of surgery: call [phone]  Questions prior to your surgery date: call [phone], Monday-Friday, 8am-4pm. If you experience any cold or flu symptoms such as cough, fever, chills, shortness of breath, etc. between now and your scheduled surgery, please notify us  at the above number.     Remember:  Do not eat after midnight the night before your surgery  You may drink clear liquids until 5:30 the morning of your surgery.   Clear liquids allowed are: Water , Non-Citrus Juices (without pulp), Carbonated Beverages, Clear Tea (no milk, honey, etc.), Black Coffee Only (NO MILK, CREAM OR POWDERED CREAMER of any kind), and Gatorade. Patient Instructions  The night before surgery:  No food after midnight. ONLY clear liquids after midnight   The day of surgery (if you have diabetes): Drink ONE (1) 12 oz G2 given to you in your pre admission testing appointment by 5:30 the morning of surgery. Drink in one sitting. Do not sip.  This drink was given to you during your hospital  pre-op appointment visit.  Nothing else to drink after completing the  12 oz bottle of G2.         If you have questions, please contact your surgeon's office.    Take these medicines the morning of surgery with A SIP OF WATER   ARIPiprazole (ABILIFY)  atorvastatin  (LIPITOR)  carisoprodol  (SOMA )  citalopram  (CELEXA  ) fenofibrate    May take these medicines IF NEEDED: ALPRAZolam  (XANAX )  naloxone  (NARCAN )  oxyCODONE -acetaminophen  (PERCOCET/ROXICET)    One week prior to surgery, STOP taking any Aspirin (unless otherwise instructed by your surgeon) Aleve, Naproxen, Ibuprofen , Motrin , Advil , Goody's, BC's, all herbal medications, fish oil, and non-prescription vitamins. THIS INCLUDES YOUR diclofenac (VOLTAREN)      Tirzepatide (MOUNJARO [place])   Hold 7 days prior to procedure            Do NOT Smoke (Tobacco/Vaping) for 24 hours prior to your procedure.  If you use a CPAP at night, you may bring your mask/headgear for your overnight stay.   You will be asked to remove any contacts, glasses, piercing's, hearing aid's, dentures/partials prior to surgery. Please bring cases for these items if needed.    Patients discharged the day of surgery will not be allowed to drive home, and someone needs to stay with them for 24 hours.  SURGICAL WAITING ROOM VISITATION Patients may have no more than 2 support people in the waiting area - these visitors may rotate.   Pre-op nurse will coordinate an appropriate time for 1 ADULT support person, who may not rotate, to accompany patient in pre-op.  Children under the age of 7 must have an adult with them who is not the patient and must remain in the main waiting area with an adult.  If the patient needs to stay at the hospital during part of their recovery, the visitor guidelines for inpatient rooms apply.  Please refer to the [facility] website for the visitor guidelines for any additional information.   If you received a COVID test during your pre-op visit  it is requested that you wear a mask when out in public, stay away from anyone that may not be feeling well and notify your surgeon if you develop symptoms. If you have been in contact with anyone that has tested positive  in the last 10 days please notify you surgeon.      Pre-operative 4 CHG Bathing Instructions   You can play a key role in reducing the risk of infection after surgery. Your skin needs to be as free of germs as possible. You can reduce the number of germs on your skin by washing with CHG (chlorhexidine  gluconate) soap before surgery. CHG is an antiseptic soap that kills germs and continues to kill germs even after washing.   DO NOT use if you have an allergy to chlorhexidine /CHG or  antibacterial soaps. If your skin becomes reddened or irritated, stop using the CHG and notify one of our RNs at [phone].   Please shower with the CHG soap starting 4 days before surgery using the following schedule:     Please keep in mind the following:  DO NOT shave, including legs and underarms, starting the day of your first shower.   You may shave your face at any point before/day of surgery.  Place clean sheets on your bed the day you start using CHG soap. Use a clean washcloth (not used since being washed) for each shower. DO NOT sleep with pets once you start using the CHG.   CHG Shower Instructions:  Wash your face and private area with normal soap. If you choose to wash your hair, wash first with your normal shampoo.  After you use shampoo/soap, rinse your hair and body thoroughly to remove shampoo/soap residue.  Turn the water  OFF and apply  bottle of CHG soap to a CLEAN washcloth.  Apply CHG soap ONLY FROM YOUR NECK DOWN TO YOUR TOES (washing for 3-5 minutes)  DO NOT use CHG soap on face, private areas, open wounds, or sores.  Pay special attention to the area where your surgery is being performed.  If you are having back surgery, having someone wash your back for you may be helpful. Wait 2 minutes after CHG soap is applied, then you may rinse off the CHG soap.  Pat dry with a clean towel  Put on clean clothes/pajamas   If you choose to wear lotion, please use ONLY the CHG-compatible lotions that are listed below.  Additional instructions for the day of surgery:  If you choose, you may shower the morning of surgery with an antibacterial soap.  DO NOT APPLY any lotions, deodorants, cologne, or perfumes.   Do not bring valuables to the hospital. [facility] is not responsible for any belongings/valuables. Do not wear nail polish, gel polish, artificial nails, or any other type of covering on natural nails (fingers and toes) Do not wear jewelry or makeup Put on  clean/comfortable clothes.  Please brush your teeth.  Ask your nurse before applying any prescription medications to the skin.     CHG Compatible Lotions   Aveeno Moisturizing lotion  Cetaphil Moisturizing Cream  Cetaphil Moisturizing Lotion  Clairol Herbal Essence Moisturizing Lotion, Dry Skin  Clairol Herbal Essence Moisturizing Lotion, Extra Dry Skin  Clairol Herbal Essence Moisturizing Lotion, Normal Skin  Curel Age Defying Therapeutic Moisturizing Lotion with Alpha Hydroxy  Curel Extreme Care Body Lotion  Curel Soothing Hands Moisturizing Hand Lotion  Curel Therapeutic Moisturizing Cream, Fragrance-Free  Curel Therapeutic Moisturizing Lotion, Fragrance-Free  Curel Therapeutic Moisturizing Lotion, Original Formula  Eucerin Daily Replenishing Lotion  Eucerin Dry Skin Therapy Plus Alpha Hydroxy Crme  Eucerin Dry Skin Therapy Plus Alpha Hydroxy Lotion  Eucerin Original Crme  Eucerin Original Lotion  Eucerin Plus Crme Eucerin Plus Lotion  Eucerin TriLipid  Replenishing Lotion  Keri Anti-Bacterial Hand Lotion  Keri Deep Conditioning Original Lotion Dry Skin Formula Softly Scented  Keri Deep Conditioning Original Lotion, Fragrance Free Sensitive Skin Formula  Keri Lotion Fast Absorbing Fragrance Free Sensitive Skin Formula  Keri Lotion Fast Absorbing Softly Scented Dry Skin Formula  Keri Original Lotion  Keri Skin Renewal Lotion Keri Silky Smooth Lotion  Keri Silky Smooth Sensitive Skin Lotion  Nivea Body Creamy Conditioning Oil  Nivea Body Extra Enriched Lotion  Nivea Body Original Lotion  Nivea Body Sheer Moisturizing Lotion Nivea Crme  Nivea Skin Firming Lotion  NutraDerm 30 Skin Lotion  NutraDerm Skin Lotion  NutraDerm Therapeutic Skin Cream  NutraDerm Therapeutic Skin Lotion  ProShield Protective Hand Cream  Provon moisturizing lotion  Please read over the following fact sheets that you were given.

## 2024-10-17 ENCOUNTER — Encounter (HOSPITAL_COMMUNITY): Payer: Self-pay

## 2024-10-17 ENCOUNTER — Other Ambulatory Visit: Payer: Self-pay

## 2024-10-17 ENCOUNTER — Encounter (HOSPITAL_COMMUNITY)
Admission: RE | Admit: 2024-10-17 | Discharge: 2024-10-17 | Disposition: A | Discharge status: Home / Self Care | Source: Ambulatory Visit | Attending: Orthopedic Surgery | Admitting: Orthopedic Surgery

## 2024-10-17 VITALS — BP 140/76 | HR 79 | Temp 98.2°F | Resp 18 | Ht 65.0 in | Wt 173.8 lb

## 2024-10-17 DIAGNOSIS — Z01812 Encounter for preprocedural laboratory examination: Secondary | ICD-10-CM | POA: Diagnosis present

## 2024-10-17 DIAGNOSIS — Z79899 Other long term (current) drug therapy: Secondary | ICD-10-CM | POA: Insufficient documentation

## 2024-10-17 DIAGNOSIS — Z01810 Encounter for preprocedural cardiovascular examination: Secondary | ICD-10-CM | POA: Diagnosis present

## 2024-10-17 DIAGNOSIS — Z01818 Encounter for other preprocedural examination: Secondary | ICD-10-CM | POA: Diagnosis not present

## 2024-10-17 LAB — CBC
HCT: 37.3 % (ref 36.0–46.0)
Hemoglobin: 12.2 g/dL (ref 12.0–15.0)
MCH: 30 pg (ref 26.0–34.0)
MCHC: 32.7 g/dL (ref 30.0–36.0)
MCV: 91.6 fL (ref 80.0–100.0)
Platelets: 343 K/uL (ref 150–400)
RBC: 4.07 MIL/uL (ref 3.87–5.11)
RDW: 13.2 % (ref 11.5–15.5)
WBC: 10.1 K/uL (ref 4.0–10.5)
nRBC: 0 % (ref 0.0–0.2)

## 2024-10-17 LAB — BASIC METABOLIC PANEL WITH GFR
Anion gap: 9 (ref 5–15)
BUN: 14 mg/dL (ref 8–23)
CO2: 31 mmol/L (ref 22–32)
Calcium: 9.8 mg/dL (ref 8.9–10.3)
Chloride: 102 mmol/L (ref 98–111)
Creatinine, Ser: 1.37 mg/dL — ABNORMAL HIGH (ref 0.44–1.00)
GFR, Estimated: 44 mL/min — ABNORMAL LOW (ref >60)
Glucose, Bld: 107 mg/dL — ABNORMAL HIGH (ref 70–99)
Potassium: 3.5 mmol/L (ref 3.5–5.1)
Sodium: 142 mmol/L (ref 135–145)

## 2024-10-17 LAB — HEMOGLOBIN A1C
Hgb A1c MFr Bld: 5.3 % (ref 4.8–5.6)
Mean Plasma Glucose: 105.41 mg/dL

## 2024-10-17 LAB — TYPE AND SCREEN
ABO/RH(D): O POS
Antibody Screen: NEGATIVE

## 2024-10-17 LAB — SURGICAL PCR SCREEN
MRSA, PCR: NEGATIVE
Staphylococcus aureus: NEGATIVE

## 2024-10-17 NOTE — Progress Notes (Signed)
 PCP - Charmaine Heller, NP  Cardiologist -   PPM/ICD - denies Device Orders - n/a Rep Notified - n/a  Chest x-ray - denies EKG - 10-17-24 Stress Test - denies ECHO - denies Cardiac Cath - denies  Sleep Study - 20 + years ago CPAP - n/a  DM -denies  Last dose of GLP1 agonist-  Tirzepatide  GLP1 instructions: Last dose 10-09-24  Blood Thinner Instructions:denies Aspirin Instructions:denies  ERAS Protcol - clear liquids until 5:30 am. PRE-SURGERY  G2   COVID TEST- n/a   Anesthesia review: No  Patient denies shortness of breath, fever, cough and chest pain at PAT appointment   All instructions explained to the patient, with a verbal understanding of the material. Patient agrees to go over the instructions while at home for a better understanding. Patient also instructed to self quarantine after being tested for COVID-19. The opportunity to ask questions was provided.

## 2024-10-22 NOTE — Progress Notes (Signed)
------------------------------------------  [facility] PROCEDURAL EXPEDITER NOTE-------------------------------------------------  Patient Name: Melanie Avila Patient DOB: 12/07/1960 Today's Date: @TODAY @   Chart reviewed:  Yes  Documentation gaps: n/a Orders in place:  Yes  Communication with surgical team if no orders: n/a Labs, test, and orders reviewed: yes Requires surgical clearance:  Yes What type of clearance: n/a Clearance received: n/a Patient status:pre op   Barriers noted:n/a  Intervention provided by [facility] team: n/a Barrier resolved:  Yes   Ronal Bald, RN [employer] [facility]

## 2024-10-23 ENCOUNTER — Encounter (HOSPITAL_COMMUNITY): Admission: RE | Disposition: A | Payer: Self-pay | Source: Home / Self Care | Attending: Orthopedic Surgery

## 2024-10-23 ENCOUNTER — Inpatient Hospital Stay (HOSPITAL_COMMUNITY)
Admission: RE | Admit: 2024-10-23 | Discharge: 2024-10-25 | DRG: 448 | Disposition: A | Discharge status: Home / Self Care | Attending: Orthopedic Surgery | Admitting: Orthopedic Surgery

## 2024-10-23 ENCOUNTER — Inpatient Hospital Stay (HOSPITAL_COMMUNITY)

## 2024-10-23 ENCOUNTER — Other Ambulatory Visit: Payer: Self-pay

## 2024-10-23 ENCOUNTER — Encounter (HOSPITAL_COMMUNITY): Payer: Self-pay | Admitting: Orthopedic Surgery

## 2024-10-23 DIAGNOSIS — Z820 Family history of epilepsy and other diseases of the nervous system: Secondary | ICD-10-CM | POA: Diagnosis not present

## 2024-10-23 DIAGNOSIS — Z8249 Family history of ischemic heart disease and other diseases of the circulatory system: Secondary | ICD-10-CM | POA: Diagnosis not present

## 2024-10-23 DIAGNOSIS — Z87891 Personal history of nicotine dependence: Secondary | ICD-10-CM | POA: Diagnosis not present

## 2024-10-23 DIAGNOSIS — F419 Anxiety disorder, unspecified: Secondary | ICD-10-CM | POA: Diagnosis present

## 2024-10-23 DIAGNOSIS — M5416 Radiculopathy, lumbar region: Secondary | ICD-10-CM

## 2024-10-23 DIAGNOSIS — M48061 Spinal stenosis, lumbar region without neurogenic claudication: Secondary | ICD-10-CM | POA: Diagnosis present

## 2024-10-23 DIAGNOSIS — Z888 Allergy status to other drugs, medicaments and biological substances status: Secondary | ICD-10-CM | POA: Diagnosis not present

## 2024-10-23 DIAGNOSIS — M797 Fibromyalgia: Secondary | ICD-10-CM | POA: Diagnosis present

## 2024-10-23 DIAGNOSIS — Z885 Allergy status to narcotic agent status: Secondary | ICD-10-CM | POA: Diagnosis not present

## 2024-10-23 DIAGNOSIS — E785 Hyperlipidemia, unspecified: Secondary | ICD-10-CM | POA: Diagnosis present

## 2024-10-23 DIAGNOSIS — Z833 Family history of diabetes mellitus: Secondary | ICD-10-CM | POA: Diagnosis not present

## 2024-10-23 DIAGNOSIS — Z91013 Allergy to seafood: Secondary | ICD-10-CM | POA: Diagnosis not present

## 2024-10-23 DIAGNOSIS — I10 Essential (primary) hypertension: Secondary | ICD-10-CM | POA: Diagnosis present

## 2024-10-23 DIAGNOSIS — Z8042 Family history of malignant neoplasm of prostate: Secondary | ICD-10-CM | POA: Diagnosis not present

## 2024-10-23 DIAGNOSIS — Z806 Family history of leukemia: Secondary | ICD-10-CM | POA: Diagnosis not present

## 2024-10-23 DIAGNOSIS — Z882 Allergy status to sulfonamides status: Secondary | ICD-10-CM | POA: Diagnosis not present

## 2024-10-23 DIAGNOSIS — K219 Gastro-esophageal reflux disease without esophagitis: Secondary | ICD-10-CM | POA: Diagnosis present

## 2024-10-23 DIAGNOSIS — Z981 Arthrodesis status: Secondary | ICD-10-CM | POA: Diagnosis not present

## 2024-10-23 HISTORY — PX: ANTERIOR LAT LUMBAR FUSION: SHX1168

## 2024-10-23 LAB — GLUCOSE, CAPILLARY: Glucose-Capillary: 110 mg/dL — ABNORMAL HIGH (ref 70–99)

## 2024-10-23 SURGERY — ANTERIOR LATERAL LUMBAR FUSION 1 LEVEL
Anesthesia: General | Laterality: Left

## 2024-10-23 MED ORDER — ONDANSETRON HCL 4 MG PO TABS: 4.0000 mg | ORAL_TABLET | Freq: Four times a day (QID) | ORAL | Status: DC | PRN

## 2024-10-23 MED ORDER — LIDOCAINE 2% (20 MG/ML) 5 ML SYRINGE
INTRAMUSCULAR | Status: DC | PRN
  Administered 2024-10-23: 100 mg via INTRAVENOUS

## 2024-10-23 MED ORDER — PROPOFOL 10 MG/ML IV BOLUS
INTRAVENOUS | Status: DC | PRN
  Administered 2024-10-23 (×5): 50 mg via INTRAVENOUS
  Administered 2024-10-23: 100 mg via INTRAVENOUS

## 2024-10-23 MED ORDER — ALPRAZOLAM 0.5 MG PO TABS
1.0000 mg | ORAL_TABLET | Freq: Two times a day (BID) | ORAL | Status: DC | PRN
  Filled 2024-10-23: qty 2

## 2024-10-23 MED ORDER — DOCUSATE SODIUM 100 MG PO CAPS
100.0000 mg | ORAL_CAPSULE | Freq: Two times a day (BID) | ORAL | Status: DC
  Administered 2024-10-23 – 2024-10-24 (×3): 100 mg via ORAL
  Filled 2024-10-23 (×3): qty 1

## 2024-10-23 MED ORDER — LISINOPRIL 20 MG PO TABS
40.0000 mg | ORAL_TABLET | Freq: Every morning | ORAL | Status: DC
  Administered 2024-10-25: 40 mg via ORAL
  Filled 2024-10-23: qty 2

## 2024-10-23 MED ORDER — PROPOFOL 500 MG/50ML IV EMUL
INTRAVENOUS | Status: DC | PRN
  Administered 2024-10-23: 125 ug/kg/min via INTRAVENOUS

## 2024-10-23 MED ORDER — ALBUTEROL SULFATE HFA 108 (90 BASE) MCG/ACT IN AERS
INHALATION_SPRAY | RESPIRATORY_TRACT | Status: DC | PRN
  Administered 2024-10-23 (×4): 2 via RESPIRATORY_TRACT

## 2024-10-23 MED ORDER — SODIUM CHLORIDE 0.9 % IV SOLN
0.1500 ug/kg/min | INTRAVENOUS | Status: DC
  Filled 2024-10-23: qty 2000

## 2024-10-23 MED ORDER — D3 + K2 125-100 MCG PO CAPS: ORAL_CAPSULE | Freq: Every day | ORAL | Status: DC

## 2024-10-23 MED ORDER — LACTATED RINGERS IV SOLN: INTRAVENOUS | Status: DC

## 2024-10-23 MED ORDER — MAGNESIUM OXIDE -MG SUPPLEMENT 400 (240 MG) MG PO TABS
400.0000 mg | ORAL_TABLET | Freq: Every morning | ORAL | Status: DC
  Administered 2024-10-25: 400 mg via ORAL
  Filled 2024-10-23: qty 1

## 2024-10-23 MED ORDER — LIDOCAINE 2% (20 MG/ML) 5 ML SYRINGE
INTRAMUSCULAR | Status: AC
  Filled 2024-10-23: qty 5

## 2024-10-23 MED ORDER — ACETAMINOPHEN 650 MG RE SUPP: 650.0000 mg | RECTAL | Status: DC | PRN

## 2024-10-23 MED ORDER — MIDAZOLAM HCL (PF) 2 MG/2ML IJ SOLN
INTRAMUSCULAR | Status: DC | PRN
  Administered 2024-10-23: 2 mg via INTRAVENOUS

## 2024-10-23 MED ORDER — SODIUM CHLORIDE 0.9 % IV SOLN: 250.0000 mL | INTRAVENOUS | Status: AC

## 2024-10-23 MED ORDER — CEFAZOLIN SODIUM-DEXTROSE 2-4 GM/100ML-% IV SOLN
2.0000 g | Freq: Three times a day (TID) | INTRAVENOUS | Status: AC
  Administered 2024-10-23 – 2024-10-24 (×2): 2 g via INTRAVENOUS
  Filled 2024-10-23 (×2): qty 100

## 2024-10-23 MED ORDER — GLYCOPYRROLATE PF 0.2 MG/ML IJ SOSY
PREFILLED_SYRINGE | INTRAMUSCULAR | Status: DC | PRN
  Administered 2024-10-23: .2 mg via INTRAVENOUS

## 2024-10-23 MED ORDER — EPHEDRINE SULFATE-NACL 50-0.9 MG/10ML-% IV SOSY
PREFILLED_SYRINGE | INTRAVENOUS | Status: DC | PRN
  Administered 2024-10-23: 5 mg via INTRAVENOUS
  Administered 2024-10-23: 10 mg via INTRAVENOUS
  Administered 2024-10-23 (×2): 5 mg via INTRAVENOUS

## 2024-10-23 MED ORDER — PANTOPRAZOLE SODIUM 40 MG PO TBEC
40.0000 mg | DELAYED_RELEASE_TABLET | Freq: Every day | ORAL | Status: DC
  Administered 2024-10-23 – 2024-10-24 (×2): 40 mg via ORAL
  Filled 2024-10-23 (×2): qty 1

## 2024-10-23 MED ORDER — VITAMIN D 25 MCG (1000 UNIT) PO TABS: 5000.0000 [IU] | ORAL_TABLET | Freq: Every day | ORAL | Status: DC

## 2024-10-23 MED ORDER — DEXMEDETOMIDINE HCL IN NACL 80 MCG/20ML IV SOLN
INTRAVENOUS | Status: AC
  Filled 2024-10-23: qty 20

## 2024-10-23 MED ORDER — FENTANYL CITRATE (PF) 250 MCG/5ML IJ SOLN
INTRAMUSCULAR | Status: AC
  Filled 2024-10-23: qty 5

## 2024-10-23 MED ORDER — CEFAZOLIN SODIUM-DEXTROSE 2-4 GM/100ML-% IV SOLN
2.0000 g | INTRAVENOUS | Status: AC
  Administered 2024-10-23: 2 g via INTRAVENOUS
  Filled 2024-10-23: qty 100

## 2024-10-23 MED ORDER — SODIUM CHLORIDE 0.9% FLUSH: 3.0000 mL | INTRAVENOUS | Status: DC | PRN

## 2024-10-23 MED ORDER — SENNOSIDES-DOCUSATE SODIUM 8.6-50 MG PO TABS: 1.0000 | ORAL_TABLET | Freq: Every evening | ORAL | Status: DC | PRN

## 2024-10-23 MED ORDER — ATORVASTATIN CALCIUM 40 MG PO TABS
40.0000 mg | ORAL_TABLET | Freq: Every morning | ORAL | Status: DC
  Administered 2024-10-24 – 2024-10-25 (×2): 40 mg via ORAL
  Filled 2024-10-23 (×2): qty 1

## 2024-10-23 MED ORDER — SODIUM CHLORIDE 0.9 % IV SOLN
0.1500 ug/kg/min | INTRAVENOUS | Status: AC
  Administered 2024-10-23: 1 ug/kg/min via INTRAVENOUS
  Filled 2024-10-23: qty 2000

## 2024-10-23 MED ORDER — 0.9 % SODIUM CHLORIDE (POUR BTL) OPTIME
TOPICAL | Status: DC | PRN
  Administered 2024-10-23: 1000 mL

## 2024-10-23 MED ORDER — ALUM & MAG HYDROXIDE-SIMETH 200-200-20 MG/5ML PO SUSP: 30.0000 mL | Freq: Four times a day (QID) | ORAL | Status: DC | PRN

## 2024-10-23 MED ORDER — ACETAMINOPHEN 10 MG/ML IV SOLN: 1000.0000 mg | Freq: Once | INTRAVENOUS | Status: DC | PRN

## 2024-10-23 MED ORDER — ONDANSETRON HCL 4 MG/2ML IJ SOLN
INTRAMUSCULAR | Status: DC | PRN
  Administered 2024-10-23: 4 mg via INTRAVENOUS

## 2024-10-23 MED ORDER — PHENYLEPHRINE 80 MCG/ML (10ML) SYRINGE FOR IV PUSH (FOR BLOOD PRESSURE SUPPORT)
PREFILLED_SYRINGE | INTRAVENOUS | Status: DC | PRN
  Administered 2024-10-23 (×3): 80 ug via INTRAVENOUS
  Administered 2024-10-23 (×2): 160 ug via INTRAVENOUS
  Administered 2024-10-23: 80 ug via INTRAVENOUS

## 2024-10-23 MED ORDER — ONDANSETRON HCL 4 MG/2ML IJ SOLN: 4.0000 mg | Freq: Once | INTRAMUSCULAR | Status: DC | PRN

## 2024-10-23 MED ORDER — ONDANSETRON HCL 4 MG/2ML IJ SOLN: 4.0000 mg | Freq: Four times a day (QID) | INTRAMUSCULAR | Status: DC | PRN

## 2024-10-23 MED ORDER — FLEET ENEMA RE ENEM: 1.0000 | ENEMA | Freq: Once | RECTAL | Status: DC | PRN

## 2024-10-23 MED ORDER — FENTANYL CITRATE (PF) 100 MCG/2ML IJ SOLN: 25.0000 ug | INTRAMUSCULAR | Status: DC | PRN

## 2024-10-23 MED ORDER — OXYCODONE-ACETAMINOPHEN 5-325 MG PO TABS
1.0000 | ORAL_TABLET | Freq: Four times a day (QID) | ORAL | Status: DC | PRN
  Administered 2024-10-23 – 2024-10-25 (×7): 1 via ORAL
  Filled 2024-10-23 (×7): qty 1

## 2024-10-23 MED ORDER — CHLORHEXIDINE GLUCONATE 0.12 % MT SOLN
15.0000 mL | Freq: Once | OROMUCOSAL | Status: AC
  Administered 2024-10-23: 15 mL via OROMUCOSAL
  Filled 2024-10-23: qty 15

## 2024-10-23 MED ORDER — FENTANYL CITRATE (PF) 250 MCG/5ML IJ SOLN
INTRAMUSCULAR | Status: DC | PRN
  Administered 2024-10-23 (×5): 50 ug via INTRAVENOUS

## 2024-10-23 MED ORDER — CITALOPRAM HYDROBROMIDE 20 MG PO TABS
40.0000 mg | ORAL_TABLET | Freq: Every morning | ORAL | Status: DC
  Administered 2024-10-24 – 2024-10-25 (×2): 40 mg via ORAL
  Filled 2024-10-23 (×2): qty 2

## 2024-10-23 MED ORDER — PANTOPRAZOLE SODIUM 40 MG IV SOLR: 40.0000 mg | Freq: Every day | INTRAVENOUS | Status: DC

## 2024-10-23 MED ORDER — SODIUM CHLORIDE 0.9% FLUSH
3.0000 mL | Freq: Two times a day (BID) | INTRAVENOUS | Status: DC
  Administered 2024-10-23 – 2024-10-24 (×3): 3 mL via INTRAVENOUS

## 2024-10-23 MED ORDER — HYDRALAZINE HCL 20 MG/ML IJ SOLN
5.0000 mg | Freq: Once | INTRAMUSCULAR | Status: AC
  Administered 2024-10-23: 5 mg via INTRAVENOUS

## 2024-10-23 MED ORDER — ARIPIPRAZOLE 2 MG PO TABS
2.0000 mg | ORAL_TABLET | Freq: Every morning | ORAL | Status: DC
  Administered 2024-10-24 – 2024-10-25 (×2): 2 mg via ORAL
  Filled 2024-10-23 (×2): qty 1

## 2024-10-23 MED ORDER — ORAL CARE MOUTH RINSE: 15.0000 mL | Freq: Once | OROMUCOSAL | Status: AC

## 2024-10-23 MED ORDER — ZOLPIDEM TARTRATE 5 MG PO TABS: 5.0000 mg | ORAL_TABLET | Freq: Every evening | ORAL | Status: DC | PRN

## 2024-10-23 MED ORDER — HYDRALAZINE HCL 20 MG/ML IJ SOLN
INTRAMUSCULAR | Status: AC
Start: 2024-10-23 — End: 2024-10-23
  Filled 2024-10-23: qty 1

## 2024-10-23 MED ORDER — OXYCODONE-ACETAMINOPHEN 10-325 MG PO TABS
1.0000 | ORAL_TABLET | Freq: Four times a day (QID) | ORAL | Status: DC | PRN
Start: 2024-10-23 — End: 2024-10-23

## 2024-10-23 MED ORDER — EPHEDRINE 5 MG/ML INJ
INTRAVENOUS | Status: AC
  Filled 2024-10-23: qty 5

## 2024-10-23 MED ORDER — ACETAMINOPHEN 325 MG PO TABS: 650.0000 mg | ORAL_TABLET | ORAL | Status: DC | PRN

## 2024-10-23 MED ORDER — NALOXONE HCL 4 MG/0.1ML NA LIQD: 1.0000 | NASAL | Status: DC | PRN

## 2024-10-23 MED ORDER — BISACODYL 5 MG PO TBEC
5.0000 mg | DELAYED_RELEASE_TABLET | Freq: Every day | ORAL | Status: DC | PRN
  Administered 2024-10-24: 5 mg via ORAL
  Filled 2024-10-23: qty 1

## 2024-10-23 MED ORDER — DEXMEDETOMIDINE HCL IN NACL 80 MCG/20ML IV SOLN
INTRAVENOUS | Status: DC | PRN
  Administered 2024-10-23: 8 ug via INTRAVENOUS

## 2024-10-23 MED ORDER — BUPIVACAINE-EPINEPHRINE (PF) 0.25% -1:200000 IJ SOLN
INTRAMUSCULAR | Status: AC
  Filled 2024-10-23: qty 30

## 2024-10-23 MED ORDER — ONDANSETRON HCL 4 MG/2ML IJ SOLN
INTRAMUSCULAR | Status: AC
  Filled 2024-10-23: qty 2

## 2024-10-23 MED ORDER — PHENYLEPHRINE HCL-NACL 20-0.9 MG/250ML-% IV SOLN
INTRAVENOUS | Status: DC | PRN
Start: 2024-10-23 — End: 2024-10-23
  Administered 2024-10-23: 25 ug/min via INTRAVENOUS

## 2024-10-23 MED ORDER — FUROSEMIDE 20 MG PO TABS
20.0000 mg | ORAL_TABLET | Freq: Every morning | ORAL | Status: DC
  Filled 2024-10-23: qty 1

## 2024-10-23 MED ORDER — PHENOL 1.4 % MT LIQD
1.0000 | OROMUCOSAL | Status: DC | PRN
Start: 2024-10-23 — End: 2024-10-25

## 2024-10-23 MED ORDER — CARISOPRODOL 350 MG PO TABS
350.0000 mg | ORAL_TABLET | Freq: Three times a day (TID) | ORAL | Status: DC | PRN
  Administered 2024-10-23 – 2024-10-25 (×4): 350 mg via ORAL
  Filled 2024-10-23 (×4): qty 1

## 2024-10-23 MED ORDER — POTASSIUM CHLORIDE IN NACL 20-0.9 MEQ/L-% IV SOLN
INTRAVENOUS | Status: DC
  Filled 2024-10-23: qty 1000

## 2024-10-23 MED ORDER — OXYCODONE HCL 5 MG/5ML PO SOLN: 5.0000 mg | Freq: Once | ORAL | Status: DC | PRN

## 2024-10-23 MED ORDER — BUPIVACAINE-EPINEPHRINE 0.25% -1:200000 IJ SOLN
INTRAMUSCULAR | Status: DC | PRN
  Administered 2024-10-23: 6 mL

## 2024-10-23 MED ORDER — PHENYLEPHRINE 80 MCG/ML (10ML) SYRINGE FOR IV PUSH (FOR BLOOD PRESSURE SUPPORT)
PREFILLED_SYRINGE | INTRAVENOUS | Status: AC
Start: 2024-10-23 — End: 2024-10-23
  Filled 2024-10-23: qty 10

## 2024-10-23 MED ORDER — PROPOFOL 10 MG/ML IV BOLUS
INTRAVENOUS | Status: AC
Start: 2024-10-23 — End: 2024-10-23
  Filled 2024-10-23: qty 20

## 2024-10-23 MED ORDER — HYDROMORPHONE HCL 1 MG/ML IJ SOLN: 0.5000 mg | INTRAMUSCULAR | Status: DC | PRN

## 2024-10-23 MED ORDER — MENTHOL 3 MG MT LOZG: 1.0000 | LOZENGE | OROMUCOSAL | Status: DC | PRN

## 2024-10-23 MED ORDER — THROMBIN 20000 UNITS EX SOLR
CUTANEOUS | Status: AC
  Filled 2024-10-23: qty 20000

## 2024-10-23 MED ORDER — OXYCODONE HCL 5 MG PO TABS: 5.0000 mg | ORAL_TABLET | Freq: Once | ORAL | Status: DC | PRN

## 2024-10-23 MED ORDER — SUCCINYLCHOLINE CHLORIDE 200 MG/10ML IV SOSY
PREFILLED_SYRINGE | INTRAVENOUS | Status: DC | PRN
  Administered 2024-10-23: 100 mg via INTRAVENOUS

## 2024-10-23 MED ORDER — MIDAZOLAM HCL 2 MG/2ML IJ SOLN
INTRAMUSCULAR | Status: AC
  Filled 2024-10-23: qty 2

## 2024-10-23 MED ORDER — PROPOFOL 10 MG/ML IV BOLUS
INTRAVENOUS | Status: AC
  Filled 2024-10-23: qty 20

## 2024-10-23 MED ORDER — POVIDONE-IODINE 7.5 % EX SOLN: Freq: Once | CUTANEOUS | Status: DC

## 2024-10-23 MED ORDER — GLYCOPYRROLATE PF 0.2 MG/ML IJ SOSY
PREFILLED_SYRINGE | INTRAMUSCULAR | Status: AC
  Filled 2024-10-23: qty 1

## 2024-10-23 MED ORDER — FENOFIBRATE 160 MG PO TABS
160.0000 mg | ORAL_TABLET | Freq: Every morning | ORAL | Status: DC
  Administered 2024-10-24 – 2024-10-25 (×2): 160 mg via ORAL
  Filled 2024-10-23 (×2): qty 1

## 2024-10-23 MED ORDER — AMISULPRIDE (ANTIEMETIC) 5 MG/2ML IV SOLN: 10.0000 mg | Freq: Once | INTRAVENOUS | Status: DC | PRN

## 2024-10-23 MED ORDER — OXYCODONE HCL 5 MG PO TABS
5.0000 mg | ORAL_TABLET | Freq: Four times a day (QID) | ORAL | Status: DC | PRN
  Administered 2024-10-23 – 2024-10-25 (×7): 5 mg via ORAL
  Filled 2024-10-23 (×7): qty 1

## 2024-10-23 MED ORDER — ROCURONIUM BROMIDE 10 MG/ML (PF) SYRINGE
PREFILLED_SYRINGE | INTRAVENOUS | Status: AC
  Filled 2024-10-23: qty 10

## 2024-10-23 MED ORDER — ENSURE PRE-SURGERY PO LIQD
296.0000 mL | Freq: Once | ORAL | Status: AC
  Administered 2024-10-24: 296 mL via ORAL
  Filled 2024-10-23: qty 296

## 2024-10-23 MED ORDER — DEXAMETHASONE SOD PHOSPHATE PF 10 MG/ML IJ SOLN
INTRAMUSCULAR | Status: DC | PRN
  Administered 2024-10-23: 10 mg via INTRAVENOUS

## 2024-10-23 SURGICAL SUPPLY — 65 items
BAG COUNTER SPONGE SURGICOUNT (BAG) ×1 IMPLANT
BENZOIN TINCTURE PRP APPL 2/3 (GAUZE/BANDAGES/DRESSINGS) IMPLANT
BLADE CLIPPER SURG (BLADE) IMPLANT
BLADE SURG 10 STRL SS (BLADE) ×1 IMPLANT
CLSR STERI-STRIP ANTIMIC 1/2X4 (GAUZE/BANDAGES/DRESSINGS) IMPLANT
COVER BACK TABLE 80X110 HD (DRAPES) ×1 IMPLANT
COVER SURGICAL LIGHT HANDLE (MISCELLANEOUS) ×1 IMPLANT
DRAPE C-ARM 42X72 X-RAY (DRAPES) ×1 IMPLANT
DRAPE C-ARMOR (DRAPES) ×1 IMPLANT
DRAPE POUCH INSTRU U-SHP 10X18 (DRAPES) ×1 IMPLANT
DRAPE SURG 17X23 STRL (DRAPES) ×4 IMPLANT
DURAPREP 26ML APPLICATOR (WOUND CARE) ×1 IMPLANT
ELECT BLADE 6.5 EXT (BLADE) ×1 IMPLANT
ELECT CAUTERY BLADE 6.4 (BLADE) ×1 IMPLANT
ELECT NVM5 SURFACE MEP/EMG (ELECTRODE) IMPLANT
ELECTRODE REM PT RTRN 9FT ADLT (ELECTROSURGICAL) ×1 IMPLANT
FEE NCS SETUP TEAR DOWN NVM5 (MISCELLANEOUS) IMPLANT
GAUZE 4X4 16PLY ~~LOC~~+RFID DBL (SPONGE) ×1 IMPLANT
GAUZE SPONGE 4X4 12PLY STRL (GAUZE/BANDAGES/DRESSINGS) IMPLANT
GLOVE BIO SURGEON STRL SZ 6.5 (GLOVE) ×2 IMPLANT
GLOVE BIO SURGEON STRL SZ8 (GLOVE) ×1 IMPLANT
GLOVE BIOGEL PI IND STRL 7.0 (GLOVE) ×1 IMPLANT
GLOVE BIOGEL PI IND STRL 8 (GLOVE) ×1 IMPLANT
GOWN STRL REUS W/ TWL LRG LVL3 (GOWN DISPOSABLE) ×1 IMPLANT
GOWN STRL REUS W/ TWL XL LVL3 (GOWN DISPOSABLE) ×2 IMPLANT
GRAFT BNE MATRIX VG FRMBL L 10 (Bone Implant) IMPLANT
IV CATH 14GX2 1/4 (CATHETERS) ×1 IMPLANT
KIT BASIN OR (CUSTOM PROCEDURE TRAY) ×1 IMPLANT
KIT DILATOR XLIF 5 (KITS) IMPLANT
KIT SURGICAL ACCESS MAXCESS (KITS) IMPLANT
KIT TURNOVER KIT B (KITS) ×1 IMPLANT
MARKER SKIN DUAL TIP RULER LAB (MISCELLANEOUS) ×1 IMPLANT
MODULE EMG NDL SSEP NVM5 (NEUROSURGERY SUPPLIES) IMPLANT
MODULE EMG NEEDLE SSEP NVM5 (NEUROSURGERY SUPPLIES) ×1 IMPLANT
NDL HYPO 25GX1X1/2 BEV (NEEDLE) ×1 IMPLANT
NDL SPNL 18GX3.5 QUINCKE PK (NEEDLE) ×1 IMPLANT
NEEDLE HYPO 25GX1X1/2 BEV (NEEDLE) ×1 IMPLANT
NEEDLE SPNL 18GX3.5 QUINCKE PK (NEEDLE) ×1 IMPLANT
PACK LAMINECTOMY ORTHO (CUSTOM PROCEDURE TRAY) ×1 IMPLANT
PACK UNIVERSAL I (CUSTOM PROCEDURE TRAY) ×1 IMPLANT
PAD ARMBOARD POSITIONER FOAM (MISCELLANEOUS) ×2 IMPLANT
PLATE ADIRA RLX 15 2H (Plate) IMPLANT
PUTTY DBX 5CC (Putty) IMPLANT
SCREW ALIGN ADIRA M4.5 GRN (Screw) IMPLANT
SCREW VA ADIRA 5.5X50 (Screw) IMPLANT
SOLN 0.9% NACL POUR BTL 1000ML (IV SOLUTION) ×2 IMPLANT
SOLN STERILE WATER BTL 1000 ML (IV SOLUTION) ×1 IMPLANT
SPACER RISE-L 18X50 7-14MM (Spacer) IMPLANT
SPONGE INTESTINAL PEANUT (DISPOSABLE) ×2 IMPLANT
SPONGE SURGIFOAM ABS GEL 100 (HEMOSTASIS) IMPLANT
SPONGE T-LAP 4X18 ~~LOC~~+RFID (SPONGE) ×1 IMPLANT
STAPLER SKIN PROX 35W (STAPLE) ×1 IMPLANT
STRIP CLOSURE SKIN 1/2X4 (GAUZE/BANDAGES/DRESSINGS) IMPLANT
SURGIFLO W/THROMBIN 8M KIT (HEMOSTASIS) IMPLANT
SUT MNCRL AB 4-0 PS2 18 (SUTURE) ×1 IMPLANT
SUT VIC AB 0 CT1 18XCR BRD 8 (SUTURE) ×1 IMPLANT
SUT VIC AB 1 CT1 18XCR BRD 8 (SUTURE) ×1 IMPLANT
SUT VIC AB 2-0 CT2 18 VCP726D (SUTURE) ×1 IMPLANT
SYR BULB IRRIG 60ML STRL (SYRINGE) ×1 IMPLANT
SYR CONTROL 10ML LL (SYRINGE) ×1 IMPLANT
TAPE CLOTH SURG 4X10 WHT LF (GAUZE/BANDAGES/DRESSINGS) IMPLANT
TOWEL GREEN STERILE (TOWEL DISPOSABLE) ×1 IMPLANT
TOWEL GREEN STERILE FF (TOWEL DISPOSABLE) ×1 IMPLANT
TRAY FOLEY MTR SLVR 16FR STAT (SET/KITS/TRAYS/PACK) ×1 IMPLANT
YANKAUER SUCT BULB TIP NO VENT (SUCTIONS) ×1 IMPLANT

## 2024-10-23 NOTE — Anesthesia Procedure Notes (Signed)
 Procedure Name: Intubation Date/Time: 10/23/2024 8:46 AM  Performed by: Jolynn Mage, CRNAPre-anesthesia Checklist: Patient identified, Patient being monitored, Timeout performed, Emergency Drugs available and Suction available Patient Re-evaluated:Patient Re-evaluated prior to induction Oxygen Delivery Method: Circle system utilized Preoxygenation: Pre-oxygenation with 100% oxygen Induction Type: IV induction Ventilation: Mask ventilation without difficulty Laryngoscope Size: Mac, 3 and Glidescope Grade View: Grade I Tube type: Oral Tube size: 7.0 mm Number of attempts: 1 Airway Equipment and Method: Video-laryngoscopy and Rigid stylet Placement Confirmation: ETT inserted through vocal cords under direct vision, positive ETCO2 and breath sounds checked- equal and bilateral Secured at: 21 cm Tube secured with: Tape Dental Injury: Teeth and Oropharynx as per pre-operative assessment

## 2024-10-23 NOTE — Anesthesia Preprocedure Evaluation (Addendum)
 Anesthesia Evaluation  Patient identified by MRN, date of birth, ID band Patient awake    Reviewed: Allergy & Precautions, NPO status , Patient's Chart, lab work & pertinent test results  History of Anesthesia Complications (+) PONV and history of anesthetic complications  Airway Mallampati: II  TM Distance: <3 FB Neck ROM: Full    Dental  (+) Teeth Intact, Dental Advisory Given   Pulmonary former smoker   breath sounds clear to auscultation       Cardiovascular hypertension, Pt. on medications  Rhythm:Regular Rate:Normal + Systolic murmurs EKG (10/2024): NSR   Neuro/Psych  PSYCHIATRIC DISORDERS (Chronic Benzodiazapine Use (reports Xanax  BID)) Anxiety Depression       GI/Hepatic ,GERD  Medicated and Controlled,,  Endo/Other  diabetes (on Mounjaro), Well Controlled, Type 2  GLP 1 last dose 10/09/24  Renal/GU Renal InsufficiencyRenal disease     Musculoskeletal  (+)  Fibromyalgia -  Abdominal   Peds  Hematology  (+) Blood dyscrasia, anemia Hgb 12.2, Plts 343K (10/17/24)   Anesthesia Other Findings   Reproductive/Obstetrics                              Anesthesia Physical Anesthesia Plan  ASA: 2  Anesthesia Plan: General   Post-op Pain Management:    Induction: Intravenous  PONV Risk Score and Plan: 3 and Ondansetron , Dexamethasone , Treatment may vary due to age or medical condition, Propofol  infusion and Midazolam   Airway Management Planned: Oral ETT and Video Laryngoscope Planned  Additional Equipment: None  Intra-op Plan:   Post-operative Plan: Extubation in OR  Informed Consent:      Dental advisory given  Plan Discussed with: CRNA  Anesthesia Plan Comments:          Anesthesia Quick Evaluation

## 2024-10-23 NOTE — Transfer of Care (Signed)
 Immediate Anesthesia Transfer of Care Note  Patient: Melanie Avila  Procedure(s) Performed: ANTERIOR LATERAL LUMBAR FUSION 1 LEVEL (Left)  Patient Location: PACU  Anesthesia Type:General  Level of Consciousness: awake, alert , patient cooperative, and responds to stimulation  Airway & Oxygen Therapy: Patient Spontanous Breathing and Patient connected to face mask oxygen  Post-op Assessment: Report given to RN, Post -op Vital signs reviewed and stable, and Patient moving all extremities X 4  Post vital signs: Reviewed and stable  Last Vitals:  Vitals Value Taken Time  BP 148/75 10/23/24 10:39  Temp    Pulse 75 10/23/24 10:42  Resp 14 10/23/24 10:42  SpO2 98 % 10/23/24 10:42  Vitals shown include unfiled device data.  Last Pain:  Vitals:   10/23/24 0724  TempSrc:   PainSc: 5          Complications: No notable events documented.

## 2024-10-23 NOTE — H&P (Signed)
 PREOPERATIVE H&P  Chief Complaint: Bilateral leg pain  HPI: Melanie Avila is a 63 y.o. female who presents with ongoing pain in the bilateral legs.  She is status post an instrumented fusion at L4-5.  She has been having bilateral leg pain for the last 2 years.  MRI reveals severe stenosis at L3-4, the level above her fusion  Patient has failed multiple forms of conservative care and continues to have pain (see office notes for additional details regarding the patient's full course of treatment)  Past Medical History:  Diagnosis Date   Anemia    Anxiety    Complication of anesthesia    last shoulder surg 2010-dsc-2 hr after block to surg-neck swelled-hard to tube-stayed RCC   DDD (degenerative disc disease)    Depression    Fibromyalgia    GERD (gastroesophageal reflux disease)    Headache    migraines in her younger years   Heart murmur    since birth and never has been a problem   History of kidney stones    7 times   Hyperlipidemia    Hypertension    Neuromuscular disorder (HCC)    nerve damage left leg   PONV (postoperative nausea and vomiting)    shorter surgeries tends to have n/v.    Pre-diabetes    Past Surgical History:  Procedure Laterality Date   BACK SURGERY  89,02   lumbar x2   CARPAL TUNNEL RELEASE Bilateral    COLONOSCOPY N/A 12/09/2015   Procedure: COLONOSCOPY;  Surgeon: Claudis RAYMOND Rivet, MD;  Location: [facility];  Service: Endoscopy;  Laterality: N/A;  830   SHOULDER ARTHROSCOPY WITH SUBACROMIAL DECOMPRESSION  10/23/2012   Procedure: SHOULDER ARTHROSCOPY WITH SUBACROMIAL DECOMPRESSION;  Surgeon: Lamar LULLA Leonor Mickey., MD;  Location:  SURGERY CENTER;  Service: Orthopedics;  Laterality: Left;  LEFT SHOULDER  ARTHROSCOPY WITH SUBACROMIAL DECOMPRESSION, DISTAL CLAVICLE RESECTION, ARTHROSCOPIC SUBSCAPULARIS REPAIR AND OPEN REPAIR OF SUPRASPINATOUS TENDON   SHOULDER SURGERY Bilateral    TRANSFORAMINAL LUMBAR INTERBODY FUSION (TLIF) WITH PEDICLE  SCREW FIXATION 1 LEVEL Right 01/06/2022   Procedure: RIGHT-SIDED LUMBAR 4 - LUMBAR 5 TRANSFORAMINAL LUMBAR INTERBODY FUSION WITH INSTRUMENTATION AND ALLOGRAFT, REMOVAL OF SPINAL CORD STIMULATOR BATTERY.;  Surgeon: Beuford Anes, MD;  Location: [facility] OR;  Service: Orthopedics;  Laterality: Right;   TUBAL LIGATION     Social History   Socioeconomic History   Marital status: Married    Spouse name: Tommy   Number of children: 2   Years of education: 12   Highest education level: Not on file  Occupational History   Not on file  Tobacco Use   Smoking status: Former    Current packs/day: 0.00    Average packs/day: 1 pack/day for 35.0 years (35.0 ttl pk-yrs)    Types: Cigarettes    Start date: 08/31/1986    Quit date: 08/31/2021    Years since quitting: 3.1   Smokeless tobacco: Never   Tobacco comments:    down to 4 cigarettes (as of 10/31/16)  Vaping Use   Vaping status: Former  Substance and Sexual Activity   Alcohol use: No   Drug use: No   Sexual activity: Not Currently  Other Topics Concern   Not on file  Social History Narrative   Patient lives at home with husband and son   Patient has 2 children    Patient has a high school education    Patient works has [employer]   Patient is right  handed    Caffeine use: 1/2-1 cup coffee occasionally    Drinks tea occas   Social Drivers of Health   Financial Resource Strain: Low Risk  (10/07/2024)   Received from [facility]   Overall Financial Resource Strain (CARDIA)    How hard is it for you to pay for the very basics like food, housing, medical care, and heating?: Not hard at all  Food Insecurity: No Food Insecurity (10/07/2024)   Received from [facility]   Hunger Vital Sign    Within the past 12 months, you worried that your food would run out before you got the money to buy more.: Never true    Within the past 12 months, the food you bought just didn't last and you didn't have money to get more.: Never true  Transportation  Needs: Unknown (10/07/2024)   Received from [facility] - Transportation    In the past 12 months, has lack of transportation kept you from medical appointments or from getting medications?: No    Lack of Transportation (Non-Medical): Not on file  Physical Activity: Inactive (10/07/2024)   Received [facility]   Exercise Vital Sign    On average, how many days per week do you engage in moderate to strenuous exercise (like a brisk walk)?: 0 days    Minutes of Exercise per Session: Not on file  Stress: Stress Concern Present (10/07/2024)   Received from [facility] - Occupational Stress Questionnaire    Do you feel stress - tense, restless, nervous, or anxious, or unable to sleep at night because your mind is troubled all the time - these days?: To some extent  Social Connections: Moderately Integrated (10/07/2024)   Received from [facility]   Social Network    How would you rate your social network (family, work, friends)?: Adequate participation with social networks   Family History  Problem Relation Age of Onset   Alzheimer's disease Mother    Prostate cancer Father    Leukemia Father    Heart attack Maternal Aunt    Heart attack Maternal Uncle    Diabetes Cousin    Diabetes Cousin    Allergies  Allergen Reactions   Cymbalta [Duloxetine Hcl]     Suicidal thoughts   Effexor [Venlafaxine Hydrochloride] Other (See Comments)    Suicidal thoughts   Shellfish Allergy Shortness Of Breath   Sulfa Antibiotics Swelling   Lyrica [Pregabalin] Other (See Comments)    dizziness   Morphine  And Codeine Other (See Comments)    Makes her constipated and does not help the pain   Prior to Admission medications   Medication Sig Start Date End Date Taking? Authorizing Provider  ALPRAZolam  (XANAX ) 1 MG tablet Take 1 mg by mouth 2 (two) times daily as needed for anxiety.    Yes [provider]  APPLE CIDER VINEGAR PO Take  1-2 tablets by mouth daily as needed (cramping).   Yes [provider]  ARIPiprazole  (ABILIFY ) 2 MG tablet Take 2 mg by mouth in the morning. 01/19/24  Yes [provider]  atorvastatin  (LIPITOR) 40 MG tablet Take 40 mg by mouth in the morning.   Yes [provider]  Biotin-Vitamin C (HAIR SKIN NAILS GUMMIES PO) Take 2 each by mouth daily.   Yes [provider]  carisoprodol  (SOMA ) 350 MG tablet Take 350 mg by mouth 3 (three) times daily as needed for muscle spasms.   Yes  [provider]  citalopram  (CELEXA ) 40 MG tablet Take 40 mg by mouth in the morning.   Yes [provider]  diclofenac (VOLTAREN) 75 MG EC tablet Take 75 mg by mouth 2 (two) times daily. 03/15/24  Yes [provider]  fenofibrate  160 MG tablet Take 160 mg by mouth in the morning.   Yes [provider]  furosemide  (LASIX ) 20 MG tablet Take 20 mg by mouth in the morning.   Yes [provider]  lisinopril  (ZESTRIL ) 20 MG tablet Take 40 mg by mouth in the morning.   Yes [provider]  MAGNESIUM  COMPLEX PO Take 2 capsules by mouth in the morning.   Yes [provider]  MILK THISTLE PO Take 7,500 mg by mouth daily.   Yes [provider]  Misc Natural Products (BEET ROOT PO) Take 3 capsules by mouth in the morning. With MCT Oil   Yes [provider]  Misc Natural Products (PUMPKIN SEED OIL PO) Take 3 capsules by mouth in the morning.   Yes [provider]  oxyCODONE -acetaminophen  (PERCOCET) 10-325 MG tablet Take 1 tablet by mouth 4 (four) times daily as needed for pain.   Yes [provider]  Vitamin D -Vitamin K (D3 + K2 PO) Take 1 tablet by mouth daily.   Yes [provider]  naloxone  (NARCAN ) nasal spray 4 mg/0.1 mL Place 1 spray into the nose as needed (opioid reversal).    [provider]  Tirzepatide [facility]) Inject 50 Units into the skin every Sunday.    [provider]     All other systems have been reviewed and were otherwise negative with the exception of those mentioned in the HPI and as above.  Physical Exam: Vitals:   10/23/24 0658  BP: (!) 156/73  Pulse: 66  Resp: 18  Temp: 97.6 F (36.4 C)  SpO2: 100%    Body mass index is 28.92 kg/m.  General: Alert, no acute distress Cardiovascular: No pedal edema Respiratory: No cyanosis, no use of accessory musculature Skin: No lesions in the area of chief complaint Neurologic: Sensation intact distally Psychiatric: Patient is competent for consent with normal mood and affect Lymphatic: No axillary or cervical lymphadenopathy   Assessment/Plan: LUMBAR RADICULOPATHY, with severe spinal stenosis and instability at L3-4, the level above the fusion Plan for Procedure(s): ANTERIOR LATERAL LUMBAR FUSION with instrumentation 1 LEVEL, L3-4   Oneil LITTIE Priestly, MD 10/23/2024 7:57 AM

## 2024-10-23 NOTE — Anesthesia Postprocedure Evaluation (Signed)
 Anesthesia Post Note  Patient: Melanie Avila  Procedure(s) Performed: ANTERIOR LATERAL LUMBAR FUSION 1 LEVEL (Left)     Patient location during evaluation: PACU Anesthesia Type: General Level of consciousness: awake Pain management: pain level controlled Vital Signs Assessment: post-procedure vital signs reviewed and stable Respiratory status: spontaneous breathing Cardiovascular status: blood pressure returned to baseline Postop Assessment: no apparent nausea or vomiting Anesthetic complications: no   No notable events documented.               Lauraine KATHEE [place]

## 2024-10-23 NOTE — Op Note (Signed)
 PATIENT NAME: Melanie Avila   MEDICAL RECORD NO.:   [id number]   DATE OF BIRTH: 08-Jul-1961   DATE OF PROCEDURE: 10/23/2024                               OPERATIVE REPORT   PREOPERATIVE DIAGNOSES: 1.  Bilateral lumbar radiculopathy 2.  Severe spinal stenosis, L3-4 3.  Status post previous fusion, L4-5, L5/S1   POSTOPERATIVE DIAGNOSES: 1.  Bilateral lumbar radiculopathy 2.  Severe spinal stenosis, L3-4 3.  Status post previous fusion, L4-5, L5/S1   PROCEDURE:  1.  Left-sided lateral interbody fusion, L3/4  via direct lateral retroperitoneal approach. 2.  Insertion of interbody device x1 (18 mm x 50 mm Globus expandable intervertebral spacer). 3.  Placement of anterior instrumentation, L3/4 (the plate and screws were not integral to the intervertebral spacer) 4.  Use of morselized allograft -- ViviGen.   5.  Intraoperative use of fluoroscopy.     SURGEON:  Oneil Priestly, MD   ASSISTANT:  Ileana Clara PA-C.   ANESTHESIA:  General endotracheal anesthesia.   COMPLICATIONS:  None.   DISPOSITION:  Stable.   ESTIMATED BLOOD LOSS:  Minimal.   INDICATIONS:  Briefly, [name] is a very pleasant 63 year-old female who did present to me with ongoing pain and weakness in the bilateral legs.  She is noted to be status post a previous fusion at L4-5, and also at L5-S1.  A more recent MRI did reveal a spondylolisthesis as well as quite severe stenosis at L3-4, the level above her fusions.  He did fail nonoperative measures, and given his ongoing pain and dysfunction, we did discuss proceeding with the procedure reflected above.  The patient did wish to proceed, after a full understanding of the risks and benefits of surgery.   DESCRIPTION OF PROCEDURE:  On 10/23/2024, the patient was brought to surgery and general endotracheal anesthesia was administered.  The patient was placed in the lateral decubitus position, with the left side up.  Neurologic monitoring leads were placed by the  monitoring technician.  The patient's torso and lower extremities were secured to the bed.  The patient's hips and knees were flexed in order to lessen the tension on the psoas musculature.  The left flank was then prepped and draped in the usual sterile fashion.  The bed was flexed, in order to optimize exposure to the L3/4 intervertebral space.  After a timeout procedure was performed, a left-sided transverse incision was made over the left flank overlying the L3/4 intervertebral space.  The retroperitoneal space was encountered, after dissection through the oblique musculature.  The peritoneum was bluntly swept anteriorly, and the psoas was readily identified.  I did use a series of dilators to dock over the L3/4 intervertebral space.  I did use neurologic monitoring while placing the  dilators, in order to ensure that there were no neurologic structures in the immediate vicinity of the dilators.  The lumbar plexus was noted to be posterior.  A self-retaining retractor was placed, and was attached to a rigid arm.  The retractor was very gently dilated and a shim was placed into the L3/4 intervertebral space.  I then used a knife to perform an annulotomy at the lateral aspect of the L3/4 intervertebral space.  I then used a series of curettes and pituitary rongeurs in order to perform a thorough and complete L3/4 intervertebral diskectomy.  The contralateral annulus was released.  I  then placed a series of intervertebral spacer trials, and I did feel that an 18 mm x 50 mm spacer would be the most appropriate fit.  The appropriate spacer was then packed with ViviGen and tamped into position, after which point it was expanded to 9.27 mm in height.  I was very pleased with the final resting position of the intervertebral spacer.  Excellent height restoration was noted on the right side, the side of the preoperative collapse. At this point, a 15 mm plate was placed over the lateral aspect of the L3 and L4 vertebral  bodies.  I then used an awl to prepare the trajectory of the L3 and L4 vertebral body screws.  A 50 mm screw was placed into the L3 vertebral body, and a 50 mm screw was placed into the L4 vertebral body.  The screws were then locked into the plate.  The break in the bed was removed and the bed was flattened and the plate was then locked.  I was very pleased with the final AP and lateral fluoroscopic images and the excellent restoration of disk height identified on both AP and lateral images.  At this point, the wound was copiously irrigated.  The fascia, internal, and external oblique musculature was closed using #1 Vicryl.  The subcutaneous layer was closed using 2-0 Vicryl and the skin was closed using 4-0 Monocryl.    Of note, I did use neurologic monitoring throughout the entire surgery, and there was no sustained EMG activity noted throughout the entire surgery. All instrument counts were correct at the termination of the procedure.   Of note, Ileana Clara was my assistant throughout surgery, and did aid in retraction, placement of the hardware, suctioning, and closure.     Oneil Priestly, MD

## 2024-10-24 ENCOUNTER — Inpatient Hospital Stay (HOSPITAL_COMMUNITY)

## 2024-10-24 ENCOUNTER — Inpatient Hospital Stay (HOSPITAL_COMMUNITY): Admitting: Anesthesiology

## 2024-10-24 ENCOUNTER — Encounter (HOSPITAL_COMMUNITY): Payer: Self-pay | Admitting: Orthopedic Surgery

## 2024-10-24 ENCOUNTER — Encounter (HOSPITAL_COMMUNITY)
Admission: RE | Disposition: A | Discharge status: Home / Self Care | Payer: Self-pay | Source: Home / Self Care | Attending: Orthopedic Surgery

## 2024-10-24 ENCOUNTER — Inpatient Hospital Stay (HOSPITAL_COMMUNITY): Admission: RE | Admit: 2024-10-24 | Source: Home / Self Care | Admitting: Orthopedic Surgery

## 2024-10-24 DIAGNOSIS — M48061 Spinal stenosis, lumbar region without neurogenic claudication: Secondary | ICD-10-CM | POA: Diagnosis not present

## 2024-10-24 DIAGNOSIS — M5416 Radiculopathy, lumbar region: Secondary | ICD-10-CM

## 2024-10-24 SURGERY — POSTERIOR LUMBAR FUSION 1 LEVEL
Anesthesia: General

## 2024-10-24 MED ORDER — PHENYLEPHRINE HCL (PRESSORS) 10 MG/ML IV SOLN
INTRAVENOUS | Status: AC
  Filled 2024-10-24: qty 1

## 2024-10-24 MED ORDER — FENTANYL CITRATE (PF) 250 MCG/5ML IJ SOLN
INTRAMUSCULAR | Status: AC
  Filled 2024-10-24: qty 5

## 2024-10-24 MED ORDER — MIDAZOLAM HCL (PF) 2 MG/2ML IJ SOLN
INTRAMUSCULAR | Status: DC | PRN
  Administered 2024-10-24: 2 mg via INTRAVENOUS

## 2024-10-24 MED ORDER — FENTANYL CITRATE (PF) 250 MCG/5ML IJ SOLN
INTRAMUSCULAR | Status: DC | PRN
  Administered 2024-10-24: 100 ug via INTRAVENOUS

## 2024-10-24 MED ORDER — OXYCODONE HCL 5 MG PO TABS
5.0000 mg | ORAL_TABLET | Freq: Once | ORAL | Status: AC | PRN
  Administered 2024-10-24: 5 mg via ORAL

## 2024-10-24 MED ORDER — PHENYLEPHRINE 80 MCG/ML (10ML) SYRINGE FOR IV PUSH (FOR BLOOD PRESSURE SUPPORT)
PREFILLED_SYRINGE | INTRAVENOUS | Status: AC
  Filled 2024-10-24: qty 10

## 2024-10-24 MED ORDER — PROPOFOL 10 MG/ML IV BOLUS
INTRAVENOUS | Status: AC
  Filled 2024-10-24: qty 20

## 2024-10-24 MED ORDER — ORAL CARE MOUTH RINSE: 15.0000 mL | Freq: Once | OROMUCOSAL | Status: AC

## 2024-10-24 MED ORDER — SODIUM CHLORIDE 0.9 % IV SOLN: INTRAVENOUS | Status: DC | PRN

## 2024-10-24 MED ORDER — EPHEDRINE 5 MG/ML INJ
INTRAVENOUS | Status: AC
  Filled 2024-10-24: qty 5

## 2024-10-24 MED ORDER — ONDANSETRON HCL 4 MG/2ML IJ SOLN: 4.0000 mg | Freq: Four times a day (QID) | INTRAMUSCULAR | Status: DC | PRN

## 2024-10-24 MED ORDER — OXYCODONE HCL 5 MG/5ML PO SOLN: 5.0000 mg | Freq: Once | ORAL | Status: AC | PRN

## 2024-10-24 MED ORDER — PHENYLEPHRINE 80 MCG/ML (10ML) SYRINGE FOR IV PUSH (FOR BLOOD PRESSURE SUPPORT)
PREFILLED_SYRINGE | INTRAVENOUS | Status: DC | PRN
  Administered 2024-10-24: 80 ug via INTRAVENOUS

## 2024-10-24 MED ORDER — LACTATED RINGERS IV SOLN: INTRAVENOUS | Status: DC | PRN

## 2024-10-24 MED ORDER — KETAMINE HCL 50 MG/5ML IJ SOSY
PREFILLED_SYRINGE | INTRAMUSCULAR | Status: AC
  Filled 2024-10-24: qty 5

## 2024-10-24 MED ORDER — CEFAZOLIN SODIUM-DEXTROSE 2-4 GM/100ML-% IV SOLN
2.0000 g | INTRAVENOUS | Status: AC
  Administered 2024-10-24: 2 g via INTRAVENOUS

## 2024-10-24 MED ORDER — CEFAZOLIN SODIUM-DEXTROSE 2-4 GM/100ML-% IV SOLN
INTRAVENOUS | Status: AC
  Filled 2024-10-24: qty 100

## 2024-10-24 MED ORDER — THROMBIN 20000 UNITS EX SOLR
CUTANEOUS | Status: AC
  Filled 2024-10-24: qty 20000

## 2024-10-24 MED ORDER — SUGAMMADEX SODIUM 200 MG/2ML IV SOLN
INTRAVENOUS | Status: DC | PRN
  Administered 2024-10-24: 200 mg via INTRAVENOUS

## 2024-10-24 MED ORDER — BUPIVACAINE-EPINEPHRINE 0.25% -1:200000 IJ SOLN
INTRAMUSCULAR | Status: DC | PRN
  Administered 2024-10-24: 20 mL
  Administered 2024-10-24: 7 mL

## 2024-10-24 MED ORDER — CHLORHEXIDINE GLUCONATE 0.12 % MT SOLN
15.0000 mL | Freq: Once | OROMUCOSAL | Status: AC
Start: 2024-10-24 — End: 2024-10-24

## 2024-10-24 MED ORDER — DEXAMETHASONE SOD PHOSPHATE PF 10 MG/ML IJ SOLN
INTRAMUSCULAR | Status: DC | PRN
  Administered 2024-10-24: 10 mg via INTRAVENOUS

## 2024-10-24 MED ORDER — BUPIVACAINE-EPINEPHRINE (PF) 0.25% -1:200000 IJ SOLN
INTRAMUSCULAR | Status: AC
  Filled 2024-10-24: qty 30

## 2024-10-24 MED ORDER — BUPIVACAINE LIPOSOME 1.3 % IJ SUSP
INTRAMUSCULAR | Status: DC | PRN
  Administered 2024-10-24: 20 mL

## 2024-10-24 MED ORDER — PHENYLEPHRINE HCL-NACL 20-0.9 MG/250ML-% IV SOLN
INTRAVENOUS | Status: DC | PRN
  Administered 2024-10-24: 45 ug/min via INTRAVENOUS

## 2024-10-24 MED ORDER — CHLORHEXIDINE GLUCONATE 0.12 % MT SOLN
OROMUCOSAL | Status: AC
  Administered 2024-10-24: 15 mL via OROMUCOSAL
  Filled 2024-10-24: qty 15

## 2024-10-24 MED ORDER — LIDOCAINE 2% (20 MG/ML) 5 ML SYRINGE
INTRAMUSCULAR | Status: DC | PRN
  Administered 2024-10-24: 100 mg via INTRAVENOUS

## 2024-10-24 MED ORDER — ROCURONIUM BROMIDE 10 MG/ML (PF) SYRINGE
PREFILLED_SYRINGE | INTRAVENOUS | Status: AC
  Filled 2024-10-24: qty 10

## 2024-10-24 MED ORDER — KETAMINE HCL 50 MG/5ML IJ SOSY
PREFILLED_SYRINGE | INTRAMUSCULAR | Status: DC | PRN
  Administered 2024-10-24: 20 mg via INTRAVENOUS
  Administered 2024-10-24: 30 mg via INTRAVENOUS

## 2024-10-24 MED ORDER — METHYLENE BLUE 20 MG/2ML IV SOSY
PREFILLED_SYRINGE | INTRAVENOUS | Status: AC
  Filled 2024-10-24: qty 2

## 2024-10-24 MED ORDER — FENTANYL CITRATE (PF) 100 MCG/2ML IJ SOLN
25.0000 ug | INTRAMUSCULAR | Status: DC | PRN
  Administered 2024-10-24 (×2): 50 ug via INTRAVENOUS

## 2024-10-24 MED ORDER — LIDOCAINE 2% (20 MG/ML) 5 ML SYRINGE
INTRAMUSCULAR | Status: AC
  Filled 2024-10-24: qty 5

## 2024-10-24 MED ORDER — POVIDONE-IODINE 7.5 % EX SOLN
Freq: Once | CUTANEOUS | Status: AC
  Filled 2024-10-24: qty 118

## 2024-10-24 MED ORDER — MIDAZOLAM HCL 2 MG/2ML IJ SOLN
INTRAMUSCULAR | Status: AC
  Filled 2024-10-24: qty 2

## 2024-10-24 MED ORDER — PROPOFOL 10 MG/ML IV BOLUS
INTRAVENOUS | Status: DC | PRN
  Administered 2024-10-24: 50 mg via INTRAVENOUS
  Administered 2024-10-24: 150 mg via INTRAVENOUS

## 2024-10-24 MED ORDER — LACTATED RINGERS IV SOLN: INTRAVENOUS | Status: DC

## 2024-10-24 MED ORDER — BUPIVACAINE LIPOSOME 1.3 % IJ SUSP
INTRAMUSCULAR | Status: AC
  Filled 2024-10-24: qty 20

## 2024-10-24 MED ORDER — ONDANSETRON HCL 4 MG/2ML IJ SOLN
INTRAMUSCULAR | Status: DC | PRN
  Administered 2024-10-24: 4 mg via INTRAVENOUS

## 2024-10-24 MED ORDER — 0.9 % SODIUM CHLORIDE (POUR BTL) OPTIME
TOPICAL | Status: DC | PRN
  Administered 2024-10-24 (×2): 1000 mL

## 2024-10-24 MED ORDER — ONDANSETRON HCL 4 MG/2ML IJ SOLN
INTRAMUSCULAR | Status: AC
  Filled 2024-10-24: qty 2

## 2024-10-24 MED ORDER — FENTANYL CITRATE (PF) 100 MCG/2ML IJ SOLN
INTRAMUSCULAR | Status: AC
  Filled 2024-10-24: qty 2

## 2024-10-24 MED ORDER — OXYCODONE HCL 5 MG PO TABS
ORAL_TABLET | ORAL | Status: AC
  Filled 2024-10-24: qty 1

## 2024-10-24 MED ORDER — THROMBIN 20000 UNITS EX SOLR
CUTANEOUS | Status: DC | PRN
  Administered 2024-10-24: 20 mL via TOPICAL

## 2024-10-24 MED ORDER — ROCURONIUM BROMIDE 10 MG/ML (PF) SYRINGE
PREFILLED_SYRINGE | INTRAVENOUS | Status: DC | PRN
  Administered 2024-10-24: 50 mg via INTRAVENOUS

## 2024-10-24 SURGICAL SUPPLY — 69 items
BAG COUNTER SPONGE SURGICOUNT (BAG) ×1 IMPLANT
BENZOIN TINCTURE PRP APPL 2/3 (GAUZE/BANDAGES/DRESSINGS) ×1 IMPLANT
BLADE CLIPPER SURG (BLADE) IMPLANT
BUR PRECISION FLUTE 5.0 (BURR) ×1 IMPLANT
BUR PRESCISION 1.7 ELITE (BURR) ×1 IMPLANT
BUR ROUND PRECISION 4.0 (BURR) IMPLANT
BUR SABER RD CUTTING 3.0 (BURR) IMPLANT
CNTNR URN SCR LID CUP LEK RST (MISCELLANEOUS) ×1 IMPLANT
COVER MAYO STAND STRL (DRAPES) ×2 IMPLANT
COVER SURGICAL LIGHT HANDLE (MISCELLANEOUS) ×1 IMPLANT
DRAPE C-ARM 42X72 X-RAY (DRAPES) ×1 IMPLANT
DRAPE C-ARMOR (DRAPES) IMPLANT
DRAPE POUCH INSTRU U-SHP 10X18 (DRAPES) ×1 IMPLANT
DRAPE SURG 17X23 STRL (DRAPES) ×4 IMPLANT
DURAPREP 26ML APPLICATOR (WOUND CARE) ×1 IMPLANT
ELECT CAUTERY BLADE 6.4 (BLADE) ×1 IMPLANT
ELECTRODE BLDE 4.0 EZ CLN MEGD (MISCELLANEOUS) ×1 IMPLANT
ELECTRODE REM PT RTRN 9FT ADLT (ELECTROSURGICAL) ×1 IMPLANT
EVACUATOR SILICONE 100CC (DRAIN) IMPLANT
FILTER STRAW FLUID ASPIR (MISCELLANEOUS) ×1 IMPLANT
GAUZE 4X4 16PLY ~~LOC~~+RFID DBL (SPONGE) ×1 IMPLANT
GAUZE SPONGE 4X4 12PLY STRL (GAUZE/BANDAGES/DRESSINGS) ×1 IMPLANT
GLOVE BIO SURGEON STRL SZ 6.5 (GLOVE) ×1 IMPLANT
GLOVE BIO SURGEON STRL SZ8 (GLOVE) ×1 IMPLANT
GLOVE BIOGEL PI IND STRL 7.0 (GLOVE) ×1 IMPLANT
GLOVE BIOGEL PI IND STRL 8 (GLOVE) ×1 IMPLANT
GOWN STRL REUS W/ TWL LRG LVL3 (GOWN DISPOSABLE) ×2 IMPLANT
GOWN STRL REUS W/ TWL XL LVL3 (GOWN DISPOSABLE) ×1 IMPLANT
GRAFT BNE MATRIX VG FRMBL SM 1 (Bone Implant) IMPLANT
IV CATH 14GX2 1/4 (CATHETERS) ×1 IMPLANT
KIT BASIN OR (CUSTOM PROCEDURE TRAY) ×1 IMPLANT
KIT POSITIONER JACKSON TABLE (MISCELLANEOUS) ×1 IMPLANT
KIT TURNOVER KIT B (KITS) ×1 IMPLANT
MARKER SKIN DUAL TIP RULER LAB (MISCELLANEOUS) ×2 IMPLANT
NDL 18GX1X1/2 (RX/OR ONLY) (NEEDLE) ×1 IMPLANT
NDL 22X1.5 STRL (OR ONLY) (MISCELLANEOUS) ×2 IMPLANT
NDL HYPO 25GX1X1/2 BEV (NEEDLE) ×1 IMPLANT
NDL SPNL 18GX3.5 QUINCKE PK (NEEDLE) ×2 IMPLANT
NEEDLE 18GX1X1/2 (RX/OR ONLY) (NEEDLE) ×1 IMPLANT
NEEDLE 22X1.5 STRL (OR ONLY) (MISCELLANEOUS) ×2 IMPLANT
NEEDLE HYPO 25GX1X1/2 BEV (NEEDLE) ×1 IMPLANT
NEEDLE SPNL 18GX3.5 QUINCKE PK (NEEDLE) ×2 IMPLANT
PACK LAMINECTOMY ORTHO (CUSTOM PROCEDURE TRAY) ×1 IMPLANT
PACK UNIVERSAL I (CUSTOM PROCEDURE TRAY) ×1 IMPLANT
PAD ARMBOARD POSITIONER FOAM (MISCELLANEOUS) ×2 IMPLANT
PATTIES SURGICAL .5 X1 (DISPOSABLE) ×1 IMPLANT
PATTIES SURGICAL .5X1.5 (GAUZE/BANDAGES/DRESSINGS) ×1 IMPLANT
ROD PRE LORDOSED 5.5X45 (Rod) IMPLANT
SCREW SET SINGLE INNER (Screw) IMPLANT
SCREW VIPER CORT FIX 6X35 (Screw) IMPLANT
SOLN 0.9% NACL POUR BTL 1000ML (IV SOLUTION) ×1 IMPLANT
SOLN STERILE WATER BTL 1000 ML (IV SOLUTION) ×1 IMPLANT
SPONGE INTESTINAL PEANUT (DISPOSABLE) ×1 IMPLANT
SPONGE SURGIFOAM ABS GEL 100 (HEMOSTASIS) ×1 IMPLANT
STRIP CLOSURE SKIN 1/2X4 (GAUZE/BANDAGES/DRESSINGS) ×2 IMPLANT
SURGIFLO W/THROMBIN 8M KIT (HEMOSTASIS) IMPLANT
SUT MNCRL AB 4-0 PS2 18 (SUTURE) ×1 IMPLANT
SUT VIC AB 0 CT1 18XCR BRD 8 (SUTURE) ×1 IMPLANT
SUT VIC AB 1 CT1 18XCR BRD 8 (SUTURE) ×1 IMPLANT
SUT VIC AB 2-0 CT2 18 VCP726D (SUTURE) ×1 IMPLANT
SYR 20ML LL LF (SYRINGE) ×2 IMPLANT
SYR BULB IRRIG 60ML STRL (SYRINGE) ×1 IMPLANT
SYR CONTROL 10ML LL (SYRINGE) ×2 IMPLANT
SYR TB 1ML LUER SLIP (SYRINGE) ×1 IMPLANT
TAP EXPEDIUM DL 5.0 (INSTRUMENTS) IMPLANT
TAP EXPEDIUM DL 6.0 (INSTRUMENTS) IMPLANT
TAPE CLOTH SURG 4X10 WHT LF (GAUZE/BANDAGES/DRESSINGS) IMPLANT
TRAY FOLEY MTR SLVR 16FR STAT (SET/KITS/TRAYS/PACK) ×1 IMPLANT
YANKAUER SUCT BULB TIP NO VENT (SUCTIONS) ×1 IMPLANT

## 2024-10-24 NOTE — H&P (Signed)
 Patient tolerated yesterday's procedure well.  Presents today for the second stage of her procedure, a decompression and fusion at L3-4 posteriorly.  Will proceed as scheduled.

## 2024-10-24 NOTE — Op Note (Signed)
 PATIENT NAME: Melanie Avila   MEDICAL RECORD NO.:   [id number]   DATE OF BIRTH: 1961/03/13   DATE OF PROCEDURE: 10/24/2024                               OPERATIVE REPORT   PREOPERATIVE DIAGNOSES: Status post left-sided lateral interbody fusion at L3-4, requiring a posterior fusion with instrumentation 2.   Bilateral lumbar radiculopathy 3.   Severe spinal stenosis, L3/4 4.   Status post previous L4-5 and L5-S1 fusion  POSTOPERATIVE DIAGNOSES: Status post left-sided lateral interbody fusion at L3-4, requiring a posterior fusion with instrumentation 2.   Bilateral lumbar radiculopathy 3.   Severe spinal stenosis, L3/4 4.   Status post previous L4-5 and L5-S1 fusion  PROCEDURE:    Posterior spinal fusion, L3-4 Bilateral partial facetectomy with bilateral lateral recess decompression, L3-4 Placement of posterior instrumentation bilaterally at L3 Exploration of spinal fusion, L4-5 Use of local autograft Use of morselized allograft - Vivigen Intraoperative use of fluoroscopy  SURGEON:  Oneil Priestly, MD.  ASSISTANTBETHA Ileana Clara, PA-C.  ANESTHESIA:  General endotracheal anesthesia.  COMPLICATIONS:  None.  DISPOSITION:  Stable.  ESTIMATED BLOOD LOSS:  Minimal.  INDICATIONS FOR SURGERY:  Briefly, Melanie Avila is a very pleasant 63- year-old female, who did present to me with pain in the bilateral legs. The patient's MRI did reveal spinal stenosis at L3-4.  We did discuss proceeding with both a lateral and posterior procedure.  The patient did undergo her lateral procedure yesterday and tolerated her procedure well.  Please refer to my operative report on 10/23/2024 for a more detailed account of the patient's history.  Patient did present today for the procedure noted above.  OPERATIVE DETAILS:  On 10/24/2024, the patient was brought to surgery and general endotracheal anesthesia was administered.  The [place] frame. Antibiotics were given and the back was prepped and draped  in the usual sterile fashion.  A time-out procedure was performed.  I then made a midline incision overlying the L3-4 and L4-5 intervertebral spaces.  The fascia was incised at the midline.  The paraspinal musculature was bluntly retracted laterally and held retracted with a self-retaining retractor.  The previously placed hardware spanning L4-5 was identified.  The caps overlying the L4-5 construct were removed as were the interconnecting rods.  At this point, I turned my attention towards exploring the fusion at L4-5.  On the left side, the L4-5 facet joint was subperiosteally exposed.  I did firmly grasp the L4 and L5 pedicles using Kochers.  A pushing and pulling maneuver was performed and the facet joint was thoroughly evaluated and  no motion was identified, confirming a successful fusion across L4-5. The bilateral L5 screws were then removed, and bone wax was placed in their place.    At this point, the L3-4 level was identified.  The lamina of L3 and L4 was approximately exposed.  I then used a high-speed bur in addition to a series of Kerrison punches to perform a bilateral partial facetectomy.  The right and left lateral recess was thoroughly decompressed.  After a thorough decompression at L3-4, I turned my attention toward the fusion aspect of the procedure.  At this point, a high-speed bur was used to decorticate the bilateral facet joints at L3-4, in addition to the posterolateral gutters, including the transverse processes of L3 and L4.  I then placed autograft from the decompression, in addition to allograft  in the form of Vivigen along the L3-4 facet joints and posterolateral gutters.  At this point, I turned my attention toward the instrumentation portion of the procedure.  Using AP and lateral fluoroscopy, I did cannulate the bilateral L3 pedicles using a medial to lateral cortical trajectory technique.  I did use a gearshift probe followed by a 5 mm tap, followed by a 6 mm tap.  At this  point, 6 x 35 mm screws were advanced into the pedicles at L3 bilaterally.  45 mm rods were secured into the tulip heads at L3 and L4 bilaterally.  Caps were placed and a final locking procedure was performed.  The wound was then copiously irrigated with an abundant amount of normal saline.  I was very pleased with the final AP and lateral fluoroscopic images.  At this point, the wound was closed in layers using #1 Vicryl, followed by 2-0 Vicryl, followed by 4- 0 Monocryl.  Benzoin and Steri-Strips were applied, followed by a sterile dressing.  All instrument counts were correct at the termination of the procedure.  Of note, Ileana Clara, PA-C, was my assistant throughout surgery, and did aid in retraction, suctioning, and closure from start to finish.   Oneil Priestly, MD

## 2024-10-24 NOTE — Anesthesia Procedure Notes (Signed)
 Procedure Name: Intubation Date/Time: 10/24/2024 7:36 AM  Performed by: Obadiah Reyes BROCKS, CRNAPre-anesthesia Checklist: Patient identified, Emergency Drugs available, Suction available and Patient being monitored Patient Re-evaluated:Patient Re-evaluated prior to induction Oxygen Delivery Method: Circle System Utilized Preoxygenation: Pre-oxygenation with 100% oxygen Induction Type: IV induction Ventilation: Mask ventilation without difficulty Laryngoscope Size: Glidescope and 3 Grade View: Grade III Tube type: Oral Tube size: 7.0 mm Number of attempts: 1 Airway Equipment and Method: Stylet and Oral airway Placement Confirmation: ETT inserted through vocal cords under direct vision, positive ETCO2 and breath sounds checked- equal and bilateral Secured at: 21 cm Tube secured with: Tape Dental Injury: Teeth and Oropharynx as per pre-operative assessment

## 2024-10-24 NOTE — Progress Notes (Signed)
 Patient evaluated at her bedside, now having had her lateral, and posterior procedures.  She is lying supine, and appears comfortable.  Her dressings are in place.  She is neurovascularly intact.  She will stay overnight and will proceed with physical therapy in the morning, with likely discharge home tomorrow after physical therapy.  All of her questions were answered.

## 2024-10-24 NOTE — Progress Notes (Addendum)
 OT Cancellation Note  Patient Details Name: TARRI GUILFOIL MRN: [id number] DOB: 1961-03-16   Cancelled Treatment:    Reason Eval/Treat Not Completed: Patient at procedure or test/ unavailable (OT to follow up post-op)  Addendum 1416: Pt working with PT on arrival. OT to follow up in the morning.  Lucie JONETTA Kendall 10/24/2024, 7:13 AM

## 2024-10-24 NOTE — Transfer of Care (Signed)
 Immediate Anesthesia Transfer of Care Note  Patient: Melanie Avila  Procedure(s) Performed: LUMBAR THREE- LUMBAR FOUR POSTERIOR DECOMPRESSION FUSION WITH INSTRUMENTATION AND ALLOGRAFT  Patient Location: PACU  Anesthesia Type:General  Level of Consciousness: awake, alert , and oriented  Airway & Oxygen Therapy: Patient Spontanous Breathing and Patient connected to face mask oxygen  Post-op Assessment: Report given to RN and Post -op Vital signs reviewed and stable  Post vital signs: Reviewed and stable  Last Vitals:  Vitals Value Taken Time  BP 148/73 10/24/24 09:36  Temp    Pulse 83 10/24/24 09:39  Resp 16 10/24/24 09:39  SpO2 95 % 10/24/24 09:39  Vitals shown include unfiled device data.  Last Pain:  Vitals:   10/24/24 0635  TempSrc: Oral  PainSc:       Patients Stated Pain Goal: 3 (10/23/24 2021)  Complications: No notable events documented.

## 2024-10-24 NOTE — Anesthesia Preprocedure Evaluation (Signed)
 Anesthesia Evaluation  Patient identified by MRN, date of birth, ID band Patient awake    Reviewed: Allergy & Precautions, H&P , NPO status , Patient's Chart, lab work & pertinent test results  History of Anesthesia Complications (+) PONV and history of anesthetic complications  Airway Mallampati: II   Neck ROM: full    Dental   Pulmonary former smoker   breath sounds clear to auscultation       Cardiovascular hypertension,  Rhythm:regular Rate:Normal     Neuro/Psych  Headaches PSYCHIATRIC DISORDERS Anxiety Depression     Neuromuscular disease    GI/Hepatic ,GERD  ,,  Endo/Other    Renal/GU      Musculoskeletal  (+)  Fibromyalgia -  Abdominal   Peds  Hematology   Anesthesia Other Findings   Reproductive/Obstetrics                              Anesthesia Physical Anesthesia Plan  ASA: 2  Anesthesia Plan: General   Post-op Pain Management:    Induction: Intravenous  PONV Risk Score and Plan: 4 or greater and Ondansetron , Dexamethasone , Midazolam  and Treatment may vary due to age or medical condition  Airway Management Planned: Oral ETT  Additional Equipment:   Intra-op Plan:   Post-operative Plan: Extubation in OR  Informed Consent: I have reviewed the patients History and Physical, chart, labs and discussed the procedure including the risks, benefits and alternatives for the proposed anesthesia with the patient or authorized representative who has indicated his/her understanding and acceptance.     Dental advisory given  Plan Discussed with: CRNA, Anesthesiologist and Surgeon  Anesthesia Plan Comments:         Anesthesia Quick Evaluation

## 2024-10-24 NOTE — Anesthesia Postprocedure Evaluation (Signed)
 Anesthesia Post Note  Patient: Melanie Avila  Procedure(s) Performed: LUMBAR THREE- LUMBAR FOUR POSTERIOR DECOMPRESSION FUSION WITH INSTRUMENTATION AND ALLOGRAFT     Patient location during evaluation: PACU Anesthesia Type: General Level of consciousness: awake and alert Pain management: pain level controlled Vital Signs Assessment: post-procedure vital signs reviewed and stable Respiratory status: spontaneous breathing, nonlabored ventilation, respiratory function stable and patient connected to nasal cannula oxygen Cardiovascular status: blood pressure returned to baseline and stable Postop Assessment: no apparent nausea or vomiting Anesthetic complications: no   No notable events documented.  Last Vitals:  Vitals:   10/24/24 1000 10/24/24 1023  BP: (!) 159/78 (!) 158/73  Pulse: 83 83  Resp: 17 18  Temp: 36.8 C 36.5 C  SpO2: 93% 94%    Last Pain:  Vitals:   10/24/24 1000  TempSrc:   PainSc: 5                  [name] S

## 2024-10-24 NOTE — Evaluation (Signed)
 Physical Therapy Evaluation  Patient Details Name: Melanie Avila MRN: [id number] DOB: 1960-12-18 Today's Date: 10/24/2024  History of Present Illness  Pt is a 63 y/o female who presents s/p 2 stage spinal surgery. On 11/19 L3-4 ALIF, and on 11/20 L3-L4 PLIF. PMH significant for anemia, anxiety, DDD, depression, fibromyalgia, HTN.  Clinical Impression  Pt admitted with above diagnosis. At the time of PT eval, pt was able to demonstrate transfers with mod I and ambulation with gross CGA to supervision for safety. Pt was educated on precautions, brace application/wearing schedule, appropriate activity progression, and car transfer. Pt currently with functional limitations due to the deficits listed below (see PT Problem List). Pt will benefit from skilled PT to increase their independence and safety with mobility to allow discharge to the venue listed below.          If plan is discharge home, recommend the following: A little help with walking and/or transfers;A little help with bathing/dressing/bathroom;Assistance with cooking/housework;Assist for transportation;Help with stairs or ramp for entrance   Can travel by private vehicle        Equipment Recommendations None recommended by PT  Recommendations for Other Services       Functional Status Assessment Patient has had a recent decline in their functional status and demonstrates the ability to make significant improvements in function in a reasonable and predictable amount of time.     Precautions / Restrictions Precautions Precautions: Fall;Back Precaution Booklet Issued: Yes (comment) Recall of Precautions/Restrictions: Intact Precaution/Restrictions Comments: Reviewed handout and pt was cued for precautions during functional mobility. Required Braces or Orthoses: Spinal Brace Spinal Brace: Thoracolumbosacral orthotic;Applied in sitting position Restrictions Weight Bearing Restrictions Per Provider Order: No      Mobility   Bed Mobility Overal bed mobility: Modified Independent             General bed mobility comments: HOB slightly elevated. No assist required to transition fully to EOB.    Transfers Overall transfer level: Modified independent Equipment used: None               General transfer comment: Pt demonstrated good posture to power up to full stand and was without gross unsteadiness or LOB.    Ambulation/Gait Ambulation/Gait assistance: Contact guard assist, Supervision Gait Distance (Feet): 200 Feet Assistive device: None Gait Pattern/deviations: Step-through pattern, Decreased stride length, Trunk flexed Gait velocity: Decreased Gait velocity interpretation: 1.31 - 2.62 ft/sec, indicative of limited community ambulator   General Gait Details: VC's for improved posture. CGA fading to supervision for safety as distance progressed.  Stairs            Wheelchair Mobility     Tilt Bed    Modified Rankin (Stroke Patients Only)       Balance Overall balance assessment: Needs assistance Sitting-balance support: Feet supported, No upper extremity supported Sitting balance-Leahy Scale: Fair     Standing balance support: No upper extremity supported, During functional activity Standing balance-Leahy Scale: Fair                               Pertinent Vitals/Pain Pain Assessment Pain Assessment: Faces Faces Pain Scale: Hurts a little bit Pain Location: Back. Pt reports she is hurting, however does not appear guarded during mobility and smiling throughout session. Pain Descriptors / Indicators: Operative site guarding Pain Intervention(s): Limited activity within patient's tolerance, Monitored during session, Repositioned    Home Living Family/patient expects to be  discharged to:: Private residence Living Arrangements: Spouse/significant other;Children Available Help at Discharge: Family;Available 24 hours/day Type of Home: Mobile home Home Access:  Stairs to enter Entrance Stairs-Rails: Doctor, General Practice of Steps: 3   Home Layout: One level Home Equipment: Pharmacist, Hospital (2 wheels)      Prior Function Prior Level of Function : Independent/Modified Independent                     Extremity/Trunk Assessment   Upper Extremity Assessment Upper Extremity Assessment: Defer to OT evaluation    Lower Extremity Assessment Lower Extremity Assessment: Generalized weakness (Mild; consistent with pre-op diagnosis)    Cervical / Trunk Assessment Cervical / Trunk Assessment: Back Surgery  Communication   Communication Communication: No apparent difficulties    Cognition Arousal: Alert Behavior During Therapy: WFL for tasks assessed/performed   PT - Cognitive impairments: No apparent impairments                         Following commands: Intact       Cueing Cueing Techniques: Verbal cues, Gestural cues     General Comments      Exercises     Assessment/Plan    PT Assessment Patient needs continued PT services  PT Problem List Decreased strength;Decreased activity tolerance;Decreased balance;Decreased mobility;Decreased knowledge of use of DME;Decreased safety awareness;Decreased knowledge of precautions;Pain       PT Treatment Interventions DME instruction;Gait training;Stair training;Functional mobility training;Therapeutic activities;Therapeutic exercise;Balance training;Patient/family education    PT Goals (Current goals can be found in the Care Plan section)  Acute Rehab PT Goals Patient Stated Goal: home tomorrow PT Goal Formulation: With patient Time For Goal Achievement: 10/31/24 Potential to Achieve Goals: Good    Frequency Min 5X/week     Co-evaluation               AM-PAC PT 6 Clicks Mobility  Outcome Measure Help needed turning from your back to your side while in a flat bed without using bedrails?: None Help needed moving from lying on your  back to sitting on the side of a flat bed without using bedrails?: None Help needed moving to and from a bed to a chair (including a wheelchair)?: None Help needed standing up from a chair using your arms (e.g., wheelchair or bedside chair)?: None Help needed to walk in hospital room?: A Little Help needed climbing 3-5 steps with a railing? : A Little 6 Click Score: 22    End of Session Equipment Utilized During Treatment: Back brace;Gait belt Activity Tolerance: Patient tolerated treatment well Patient left: in bed;with call bell/phone within reach;with family/visitor present Nurse Communication: Mobility status PT Visit Diagnosis: Unsteadiness on feet (R26.81);Pain Pain - part of body:  (back)    Time: 8654-8590 PT Time Calculation (min) (ACUTE ONLY): 24 min   Charges:   PT Evaluation $PT Eval Low Complexity: 1 Low PT Treatments $Gait Training: 8-22 mins PT General Charges $$ ACUTE PT VISIT: 1 Visit         Leita Sable, PT, DPT Acute Rehabilitation Services Secure Chat Preferred Office: [phone]   [name] 10/24/2024, 3:29 PM

## 2024-10-24 NOTE — Progress Notes (Signed)
 PT Cancellation Note  Patient Details Name: KALEA PERINE MRN: [id number] DOB: [birth year]   Cancelled Treatment:    Reason Eval/Treat Not Completed: PT eval received, chart reviewed. Pt out of surgery and back on the unit. Pt requesting to eat prior to PT session. Will check back later to initiate PT eval.   [name] 10/24/2024, 11:12 AM  Leita Sable, PT, DPT Acute Rehabilitation Services Secure Chat Preferred Office: [phone]

## 2024-10-25 NOTE — Progress Notes (Signed)
    Patient doing well PO Day 2/1 S/P staged LAT/POST lumbar fusion. Pt it doing well with resolved preop leg pain and some mild residual numbness, she has been up walking. She is eager to get PT and proceed home. She is in pain management at baseline and they are handling her medications. She is comfortable.   Physical Exam: Vitals:   10/25/24 0445 10/25/24 0731  BP: (!) 163/80 (!) 143/66  Pulse: 84 85  Resp: 18 20  Temp: 99.3 F (37.4 C) 97.9 F (36.6 C)  SpO2: 97% 97%    Dressing in place, CDI LAT and POST,TLSO at bedside, pt resting comfortably in bed, NVI  POD #2/1 s/p staged LAT/POST lumbar fusion, doing well  - up with PT/OT, encourage ambulation  -D/C after PT - Pain meds through pain management  - d/c home today with f/u in 2 weeks

## 2024-10-25 NOTE — Progress Notes (Signed)
 Patient alert and oriented, surgical site clean and dry no sign of infection. Patient ambulated, voided. D/c instructions explain and given to the patient, all questions answered.

## 2024-10-25 NOTE — Progress Notes (Signed)
 PT Cancellation Note  Patient Details Name: Melanie Avila MRN: [id number] DOB: 1960/12/07   Cancelled Treatment:    Reason Eval/Treat Not Completed: Pt declined PT session this morning. States she is ready for d/c and feels comfortable entering home. Refuses stair training at this time. If pt changes her mind I will make myself available to come see her for stair training prior to d/c. Will continue to follow.    [name] 10/25/2024, 9:46 AM  Leita Sable, PT, DPT Acute Rehabilitation Services Secure Chat Preferred Office: [phone]

## 2024-10-25 NOTE — Evaluation (Signed)
 Occupational Therapy Evaluation Patient Details Name: Melanie Avila MRN: [id number] DOB: 02-12-1961 Today's Date: 10/25/2024   History of Present Illness   Pt is a 63 y/o female who presents s/p 2 stage spinal surgery. On 11/19 L3-4 ALIF, and on 11/20 L3-L4 PLIF. PMH significant for anemia, anxiety, DDD, depression, fibromyalgia, HTN.     Clinical Impressions Melanie Avila was evaluated s/p the above spine surgery. She lives with family and is indep at baseline. Upon evaluation pt was limited by surgical pain, spinal precautions, compensatory techniques limited L hip ROM and decreased activity tolerance. Overall she demonstrated mod I ability to mobilize and complete most ADLs, however she did need min A for LB dressing. Provided cues and education on spinal precautions and compensatory techniques throughout, handout provided and pt demonstrated great recall during ADLs and mobility. Pt does not require further acute OT services. Recommend d/c home with support of family.       If plan is discharge home, recommend the following:   Assistance with cooking/housework;Assist for transportation     Functional Status Assessment   Patient has had a recent decline in their functional status and demonstrates the ability to make significant improvements in function in a reasonable and predictable amount of time.     Equipment Recommendations   None recommended by OT      Precautions/Restrictions   Precautions Precautions: Fall;Back Precaution Booklet Issued: Yes (comment) Recall of Precautions/Restrictions: Intact Precaution/Restrictions Comments: Reviewed handout and pt was cued for precautions during functional mobility. Required Braces or Orthoses: Spinal Brace Spinal Brace: Thoracolumbosacral orthotic;Applied in sitting position Restrictions Weight Bearing Restrictions Per Provider Order: No     Mobility Bed Mobility Overal bed mobility: Modified Independent              General bed mobility comments: pt has an adjustable bed at home    Transfers Overall transfer level: Modified independent                        Balance Overall balance assessment: Needs assistance Sitting-balance support: Feet supported, No upper extremity supported Sitting balance-Leahy Scale: Fair     Standing balance support: No upper extremity supported, During functional activity Standing balance-Leahy Scale: Fair                             ADL either performed or assessed with clinical judgement   ADL Overall ADL's : Needs assistance/impaired Eating/Feeding: Independent   Grooming: Modified independent   Upper Body Bathing: Moderate assistance;Modified independent   Lower Body Bathing: Modified independent   Upper Body Dressing : Modified independent   Lower Body Dressing: Minimal assistance   Toilet Transfer: Modified Independent;Ambulation   Toileting- Clothing Manipulation and Hygiene: Modified independent       Functional mobility during ADLs: Modified independent General ADL Comments: LLE limited mobility     Vision Ability to See in Adequate Light: 0 Adequate Vision Assessment?: No apparent visual deficits     Perception Perception: Within Functional Limits       Praxis Praxis: WFL       Pertinent Vitals/Pain Pain Assessment Pain Assessment: Faces Faces Pain Scale: Hurts a little bit Pain Location: Back. L hip Pain Descriptors / Indicators: Operative site guarding Pain Intervention(s): Limited activity within patient's tolerance, Monitored during session     Extremity/Trunk Assessment Upper Extremity Assessment Upper Extremity Assessment: Overall WFL for tasks assessed   Lower Extremity Assessment Lower  Extremity Assessment: Defer to PT evaluation   Cervical / Trunk Assessment Cervical / Trunk Assessment: Back Surgery   Communication Communication Communication: No apparent difficulties   Cognition  Arousal: Alert Behavior During Therapy: WFL for tasks assessed/performed Cognition: No apparent impairments                               Following commands: Intact       Cueing  General Comments   Cueing Techniques: Verbal cues;Gestural cues  VSS on RA           Home Living Family/patient expects to be discharged to:: Private residence Living Arrangements: Spouse/significant other;Children Available Help at Discharge: Family;Available 24 hours/day Type of Home: Mobile home Home Access: Stairs to enter Entrance Stairs-Number of Steps: 3 Entrance Stairs-Rails: Right;Left Home Layout: One level     Bathroom Shower/Tub: Walk-in shower         Home Equipment: Pharmacist, Hospital (2 wheels)          Prior Functioning/Environment Prior Level of Function : Independent/Modified Independent                    OT Problem List: Decreased activity tolerance;Decreased knowledge of precautions   OT Treatment/Interventions:        OT Goals(Current goals can be found in the care plan section)   Acute Rehab OT Goals Patient Stated Goal: home asap OT Goal Formulation: With patient Time For Goal Achievement: 11/08/24 Potential to Achieve Goals: Good   OT Frequency:       Co-evaluation              AM-PAC OT 6 Clicks Daily Activity     Outcome Measure Help from another person eating meals?: None Help from another person taking care of personal grooming?: None Help from another person toileting, which includes using toliet, bedpan, or urinal?: None Help from another person bathing (including washing, rinsing, drying)?: None Help from another person to put on and taking off regular upper body clothing?: None Help from another person to put on and taking off regular lower body clothing?: A Little 6 Click Score: 23   End of Session Equipment Utilized During Treatment: Back brace Nurse Communication: Mobility status  Activity  Tolerance: Patient tolerated treatment well Patient left: in bed;with call bell/phone within reach  OT Visit Diagnosis: Other abnormalities of gait and mobility (R26.89);Pain                Time: 9159-9141 OT Time Calculation (min): 18 min Charges:  OT General Charges $OT Visit: 1 Visit OT Evaluation $OT Eval Low Complexity: 1 Low  Lucie Kendall, OTR/L Acute Rehabilitation Services Office [phone] Secure Chat Communication Preferred   Lucie JONETTA Kendall 10/25/2024, 9:25 AM

## 2024-11-14 NOTE — Discharge Summary (Signed)
 Patient ID: Melanie Avila MRN: [id number] DOB/AGE: Nov 12, 1961 63 y.o.  Admit date: 10/23/2024 Discharge date: 10/25/2024  Admission Diagnoses:  Principal Problem:   Spinal stenosis of lumbar region   Discharge Diagnoses:  Same  Past Medical History:  Diagnosis Date   Anemia    Anxiety    Complication of anesthesia    last shoulder surg 2010-dsc-2 hr after block to surg-neck swelled-hard to tube-stayed RCC   DDD (degenerative disc disease)    Depression    Fibromyalgia    GERD (gastroesophageal reflux disease)    Headache    migraines in her younger years   Heart murmur    since birth and never has been a problem   History of kidney stones    7 times   Hyperlipidemia    Hypertension    Neuromuscular disorder (HCC)    nerve damage left leg   PONV (postoperative nausea and vomiting)    shorter surgeries tends to have n/v.    Pre-diabetes     Surgeries: Procedures: LUMBAR THREE- LUMBAR FOUR POSTERIOR DECOMPRESSION FUSION WITH INSTRUMENTATION AND ALLOGRAFT on 10/24/2024   Consultants: None  Discharged Condition: Improved  Hospital Course: Melanie Avila is an 63 y.o. female who was admitted 10/23/2024 for operative treatment of Spinal stenosis of lumbar region. Patient has severe unremitting pain that affects sleep, daily activities, and work/hobbies. After pre-op clearance the patient was taken to the operating room on 10/24/2024 and underwent  Procedures: LUMBAR THREE- LUMBAR FOUR POSTERIOR DECOMPRESSION FUSION WITH INSTRUMENTATION AND ALLOGRAFT.    Patient was given perioperative antibiotics:  Anti-infectives (From admission, onward)    Start     Dose/Rate Route Frequency Ordered Stop   10/24/24 0730  ceFAZolin  (ANCEF ) IVPB 2g/100 mL premix        2 g 200 mL/hr over 30 Minutes Intravenous On call to O.R. 10/24/24 0641 10/24/24 0737   10/24/24 0559  ceFAZolin  (ANCEF ) 2-4 GM/100ML-% IVPB       Note to Pharmacy: Effie Rily O: cabinet override       10/24/24 0559 10/24/24 0817   10/23/24 1700  ceFAZolin  (ANCEF ) IVPB 2g/100 mL premix        2 g 200 mL/hr over 30 Minutes Intravenous Every 8 hours 10/23/24 1234 10/24/24 1054   10/23/24 0700  ceFAZolin  (ANCEF ) IVPB 2g/100 mL premix        2 g 200 mL/hr over 30 Minutes Intravenous On call to O.R. 10/23/24 9352 10/23/24 0925        Patient was given sequential compression devices, early ambulation to prevent DVT.  Patient benefited maximally from hospital stay and there were no complications.    Recent vital signs: BP (!) 143/66 (BP Location: Left Arm)   Pulse 85   Temp 97.9 F (36.6 C) (Oral)   Resp 20   Ht 5' 5 (1.651 m)   Wt 78.8 kg   SpO2 97%   BMI 28.92 kg/m    Discharge Medications:   Allergies as of 10/25/2024       Reactions   Cymbalta [duloxetine Hcl]    Suicidal thoughts   Effexor [venlafaxine Hydrochloride] Other (See Comments)   Suicidal thoughts   Shellfish Allergy Shortness Of Breath   Sulfa Antibiotics Swelling   Lyrica [pregabalin] Other (See Comments)   dizziness   Morphine  And Codeine Other (See Comments)   Makes her constipated and does not help the pain        Medication List  TAKE these medications    ALPRAZolam  1 MG tablet Commonly known as: XANAX  Take 1 mg by mouth 2 (two) times daily as needed for anxiety.   APPLE CIDER VINEGAR PO Take 1-2 tablets by mouth daily as needed (cramping).   ARIPiprazole  2 MG tablet Commonly known as: ABILIFY  Take 2 mg by mouth in the morning.   atorvastatin  40 MG tablet Commonly known as: LIPITOR Take 40 mg by mouth in the morning.   carisoprodol  350 MG tablet Commonly known as: SOMA  Take 350 mg by mouth 3 (three) times daily as needed for muscle spasms.   citalopram  40 MG tablet Commonly known as: CELEXA  Take 40 mg by mouth in the morning.   D3 + K2 PO Take 1 tablet by mouth daily.   fenofibrate  160 MG tablet Take 160 mg by mouth in the morning.   furosemide  20 MG tablet Commonly  known as: LASIX  Take 20 mg by mouth in the morning.   HAIR SKIN NAILS GUMMIES PO Take 2 each by mouth daily.   lisinopril  20 MG tablet Commonly known as: ZESTRIL  Take 40 mg by mouth in the morning.   MAGNESIUM  COMPLEX PO Take 2 capsules by mouth in the morning.   MILK THISTLE PO Take 7,500 mg by mouth daily.   MOUNJARO Howe Inject 50 Units into the skin every Sunday.   naloxone  4 MG/0.1ML Liqd nasal spray kit Commonly known as: NARCAN  Place 1 spray into the nose as needed (opioid reversal).   oxyCODONE -acetaminophen  10-325 MG tablet Commonly known as: PERCOCET Take 1 tablet by mouth 4 (four) times daily as needed for pain.   PUMPKIN SEED OIL PO Take 3 capsules by mouth in the morning.   BEET ROOT PO Take 3 capsules by mouth in the morning. With MCT Oil        Diagnostic Studies: DG Lumbar Spine 1 View Result Date: 10/24/2024 CLINICAL DATA:  Elective surgery. EXAM: LUMBAR SPINE - 1 VIEW COMPARISON:  Preoperative imaging. FINDINGS: Single lateral spot view lumbar spine submitted from the operating room. Posterior rod and pedicle screw fixation at L4-L5 with interbody spacer. There is lateral fusion at L3-L4. Surgical instruments localize posteriorly at the L3 and sacral level. IMPRESSION: Intraoperative localization during lumbar surgery. Electronically Signed   By: Andrea Gasman M.D.   On: 10/24/2024 11:24   DG Lumbar Spine 2-3 Views Result Date: 10/24/2024 CLINICAL DATA:  Elective surgery. EXAM: LUMBAR SPINE - 2-3 VIEW COMPARISON:  Preoperative imaging FINDINGS: Two fluoroscopic spot views of the lumbar spine submitted from the operating room. Pedicle screws are present at L3 and L4 with interbody spacer. Lateral L3-L4 fusion. Previous L4-L5 interbody spacer. The previous L5 pedicle screws are not seen on provided images. Fluoroscopy time 12.8 seconds. Dose 10.1 mGy. IMPRESSION: Intraoperative fluoroscopy during lumbar surgery. Electronically Signed   By: Andrea Gasman  M.D.   On: 10/24/2024 11:24   DG C-Arm 1-60 Min-No Report Result Date: 10/24/2024 Fluoroscopy was utilized by the requesting physician.  No radiographic interpretation.   DG Lumbar Spine 2-3 Views Result Date: 10/23/2024 EXAM: 2 IMAGES VIEW(S) XRAY OF THE LUMBAR SPINE 10/23/2024 10:20:00 AM COMPARISON: 06/02/2023 CLINICAL HISTORY: 461500 Elective surgery 461500 Elective surgery 461500 FINDINGS: LUMBAR SPINE: BONES: Interval left lateral plate and screw hardware fixation of the L3-L4 vertebra with interbody fusion. Bilateral posterior rods and pedicle screws with interbody fusion at L4-L5. DISCS AND DEGENERATIVE CHANGES: Interbody fusions are present at L3-L4 and L4-L5. SOFT TISSUES: No acute abnormality. IMPRESSION: 1. Interval left  lateral plate and screw hardware fixation at L3-L4 with interbody fusion, appropriate postoperative appearance. 2. Existing bilateral posterior rods and pedicle screws with interbody fusion at L4-L5, unchanged. Electronically signed by: Waddell Calk MD 10/23/2024 01:41 PM EST RP Workstation: [id number]   DG C-Arm 1-60 Min-No Report Result Date: 10/23/2024 Fluoroscopy was utilized by the requesting physician.  No radiographic interpretation.   DG C-Arm 1-60 Min-No Report Result Date: 10/23/2024 Fluoroscopy was utilized by the requesting physician.  No radiographic interpretation.    Disposition: Discharge disposition: 01-Home or Self Care       Discharge Instructions     Call MD / Call 911   Complete by: As directed    If you experience chest pain or shortness of breath, CALL 911 and be transported to the hospital emergency room.  If you develope a fever above 101 F, pus (white drainage) or increased drainage or redness at the wound, or calf pain, call your surgeon's office.   Constipation Prevention   Complete by: As directed    Drink plenty of fluids.  Prune juice may be helpful.  You may use a stool softener, such as Colace (over the counter) 100 mg  twice a day.  Use MiraLax (over the counter) for constipation as needed.   Diet - low sodium heart healthy   Complete by: As directed    Increase activity slowly as tolerated   Complete by: As directed    Post-operative opioid taper instructions:   Complete by: As directed    POST-OPERATIVE OPIOID TAPER INSTRUCTIONS: It is important to wean off of your opioid medication as soon as possible. If you do not need pain medication after your surgery it is ok to stop day one. Opioids include: Codeine, Hydrocodone (Norco, Vicodin), Oxycodone (Percocet, oxycontin ) and hydromorphone  amongst others.  Long term and even short term use of opiods can cause: Increased pain response Dependence Constipation Depression Respiratory depression And more.  Withdrawal symptoms can include Flu like symptoms Nausea, vomiting And more Techniques to manage these symptoms Hydrate well Eat regular healthy meals Stay active Use relaxation techniques(deep breathing, meditating, yoga) Do Not substitute Alcohol to help with tapering If you have been on opioids for less than two weeks and do not have pain than it is ok to stop all together.  Plan to wean off of opioids This plan should start within one week post op of your joint replacement. Maintain the same interval or time between taking each dose and first decrease the dose.  Cut the total daily intake of opioids by one tablet each day Next start to increase the time between doses. The last dose that should be eliminated is the evening dose.         POD #2/1 s/p staged LAT/POST lumbar fusion, doing well   - up with PT/OT, encourage ambulation             -D/C after PT - Pain meds through pain management  -Scripts for pain sent to pharmacy electronically  -D/C instructions sheet printed and in chart -D/C today  -F/U in office 2 weeks   Signed: Jahree Dermody J Omesha Bowerman 11/14/2024, 10:49 AM

## 2025-01-15 ENCOUNTER — Ambulatory Visit: Admitting: Pulmonary Disease
